# Patient Record
Sex: Female | Born: 1945 | Race: Black or African American | Hispanic: No | Smoking: Never smoker
Health system: Southern US, Community
[De-identification: ages and names within clinical notes are randomized; demographics above are authoritative.]

## PROBLEM LIST (undated history)

## (undated) DIAGNOSIS — R011 Cardiac murmur, unspecified: Secondary | ICD-10-CM

## (undated) DIAGNOSIS — D649 Anemia, unspecified: Secondary | ICD-10-CM

## (undated) DIAGNOSIS — D569 Thalassemia, unspecified: Secondary | ICD-10-CM

## (undated) DIAGNOSIS — Z86010 Personal history of colonic polyps: Secondary | ICD-10-CM

## (undated) HISTORY — PX: ABDOMINAL HYSTERECTOMY: SHX81

## (undated) HISTORY — DX: Cardiac murmur, unspecified: R01.1

## (undated) HISTORY — PX: COLONOSCOPY: SHX174

## (undated) HISTORY — DX: Personal history of colonic polyps: Z86.010

---

## 2002-03-22 ENCOUNTER — Other Ambulatory Visit: Admission: RE | Admit: 2002-03-22 | Discharge: 2002-03-22 | Payer: Self-pay | Admitting: Obstetrics and Gynecology

## 2003-03-25 ENCOUNTER — Other Ambulatory Visit: Admission: RE | Admit: 2003-03-25 | Discharge: 2003-03-25 | Payer: Self-pay | Admitting: Obstetrics and Gynecology

## 2004-01-22 ENCOUNTER — Encounter: Admission: RE | Admit: 2004-01-22 | Discharge: 2004-01-22 | Payer: Self-pay | Admitting: Family Medicine

## 2004-04-14 ENCOUNTER — Other Ambulatory Visit: Admission: RE | Admit: 2004-04-14 | Discharge: 2004-04-14 | Payer: Self-pay | Admitting: Obstetrics and Gynecology

## 2005-03-18 ENCOUNTER — Ambulatory Visit: Payer: Self-pay | Admitting: Family Medicine

## 2005-04-20 ENCOUNTER — Other Ambulatory Visit: Admission: RE | Admit: 2005-04-20 | Discharge: 2005-04-20 | Payer: Self-pay | Admitting: Obstetrics and Gynecology

## 2007-07-12 ENCOUNTER — Ambulatory Visit: Payer: Self-pay | Admitting: Internal Medicine

## 2007-07-26 ENCOUNTER — Ambulatory Visit: Payer: Self-pay | Admitting: Internal Medicine

## 2007-07-26 ENCOUNTER — Encounter: Payer: Self-pay | Admitting: Internal Medicine

## 2008-04-10 ENCOUNTER — Encounter: Payer: Self-pay | Admitting: Family Medicine

## 2008-04-10 LAB — CONVERTED CEMR LAB
Ferritin: 912 ng/mL
HCT: 29.3 %
Hemoglobin: 8.7 g/dL
Iron: 78 ug/dL
Platelets: 244 10*3/uL
WBC: 6.2 10*3/uL

## 2008-04-16 ENCOUNTER — Ambulatory Visit: Payer: Self-pay | Admitting: Hematology and Oncology

## 2008-04-17 ENCOUNTER — Encounter: Payer: Self-pay | Admitting: Family Medicine

## 2008-04-17 LAB — CBC WITH DIFFERENTIAL/PLATELET
Basophils Absolute: 0.1 10*3/uL (ref 0.0–0.1)
HCT: 29.9 % — ABNORMAL LOW (ref 34.8–46.6)
HGB: 9.1 g/dL — ABNORMAL LOW (ref 11.6–15.9)
MONO#: 0.4 10*3/uL (ref 0.1–0.9)
NEUT%: 70.7 % (ref 39.6–76.8)
WBC: 7.2 10*3/uL (ref 3.9–10.0)
lymph#: 1.6 10*3/uL (ref 0.9–3.3)

## 2008-04-17 LAB — COMPREHENSIVE METABOLIC PANEL
ALT: 10 U/L (ref 0–35)
BUN: 15 mg/dL (ref 6–23)
CO2: 31 mEq/L (ref 19–32)
Calcium: 10.3 mg/dL (ref 8.4–10.5)
Chloride: 103 mEq/L (ref 96–112)
Creatinine, Ser: 0.82 mg/dL (ref 0.40–1.20)

## 2008-04-17 LAB — LACTATE DEHYDROGENASE: LDH: 152 U/L (ref 94–250)

## 2008-04-17 LAB — MORPHOLOGY

## 2008-04-17 LAB — RETICULOCYTES: IRF: 0.26 (ref 0.130–0.330)

## 2008-04-21 ENCOUNTER — Ambulatory Visit (HOSPITAL_COMMUNITY): Admission: RE | Admit: 2008-04-21 | Discharge: 2008-04-21 | Payer: Self-pay | Admitting: Hematology and Oncology

## 2008-04-21 LAB — VITAMIN B12: Vitamin B-12: 450 pg/mL (ref 211–911)

## 2008-04-21 LAB — DIRECT ANTIGLOBULIN TEST (NOT AT ARMC)
DAT (Complement): NEGATIVE
DAT IgG: NEGATIVE

## 2008-04-21 LAB — HEMOGLOBINOPATHY EVALUATION: Hgb A2 Quant: 5 % — ABNORMAL HIGH (ref 2.2–3.2)

## 2008-04-21 LAB — IRON AND TIBC: %SAT: 17 % — ABNORMAL LOW (ref 20–55)

## 2008-04-21 IMAGING — US US ABDOMEN COMPLETE
1 series · 13 of 25 positions shown · non-contrast
Comparison: None.

CLINICAL DATA: 61-year-old female with thalassemia.  Remote history
of gallstones.

ABDOMEN ULTRASOUND
TECHNIQUE: Complete abdominal ultrasound examination was performed
including evaluation of the liver, gallbladder, bile ducts,
pancreas, kidneys, spleen, IVC, and abdominal aorta.

[Series 1: unknown · 0.30mm/px · 13 of 65 slices shown]
[im 1/65]
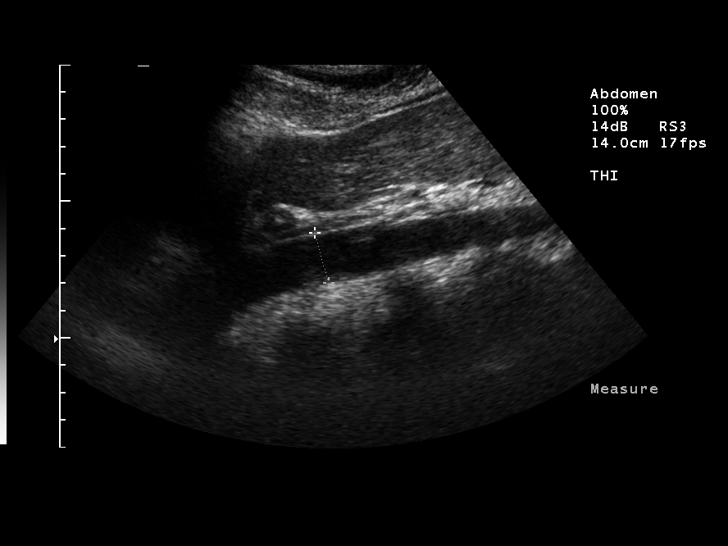
[im 6/65]
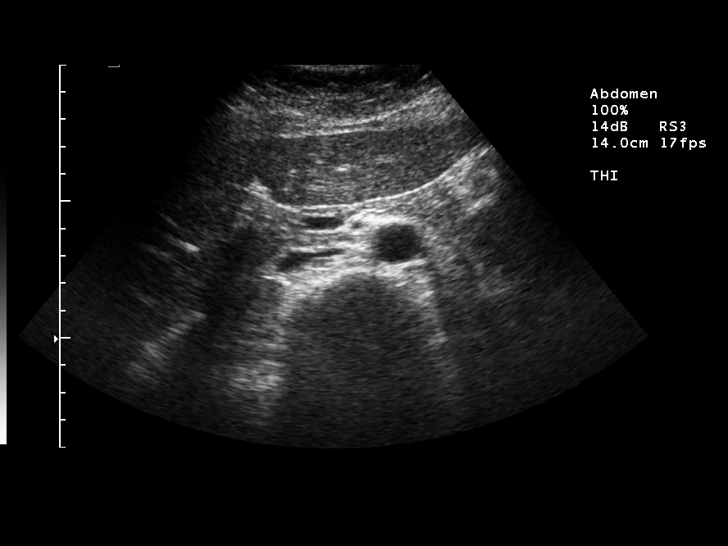
[im 11/65]
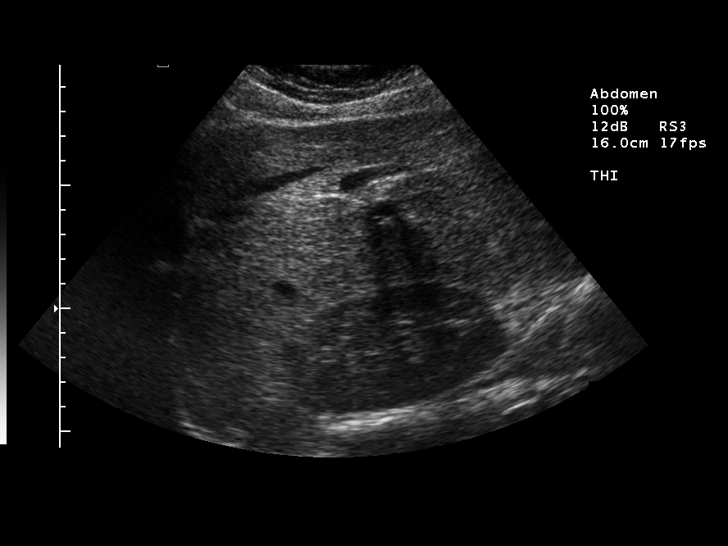
[im 17/65]
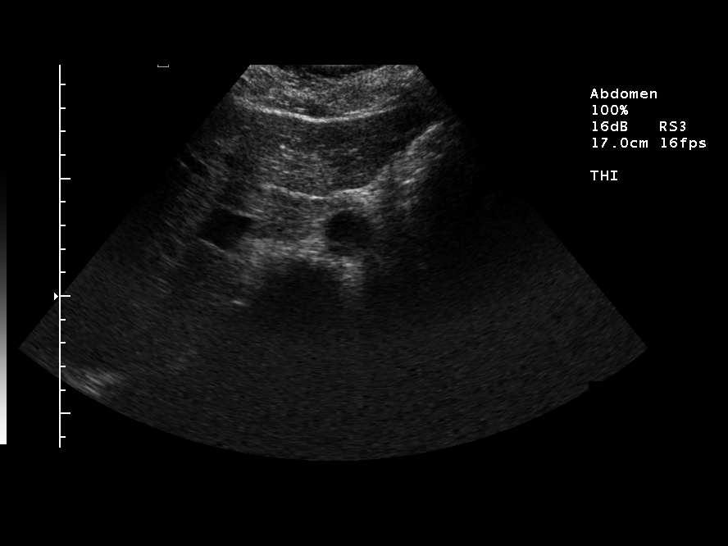
[im 22/65]
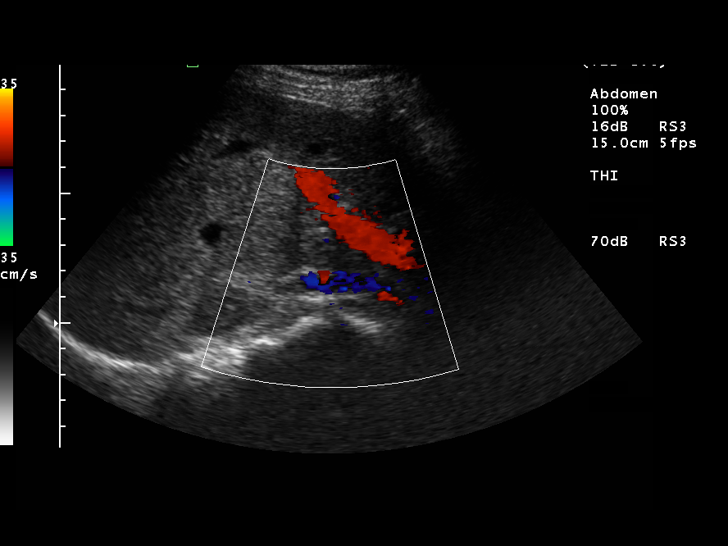
[im 27/65]
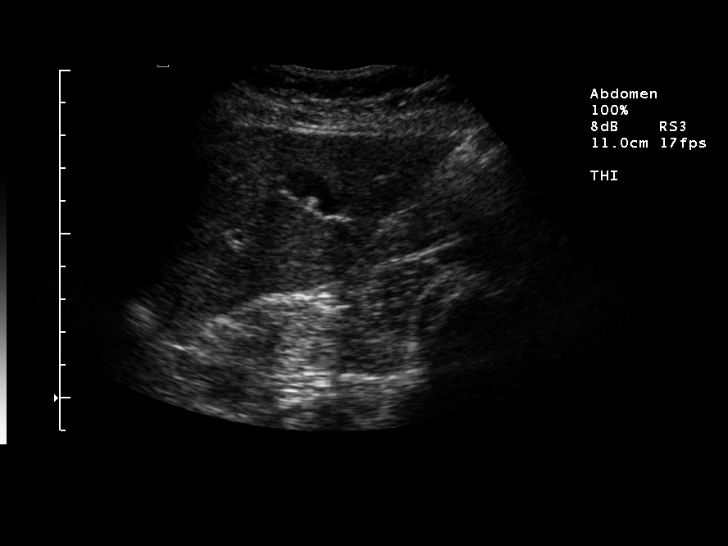
[im 33/65]
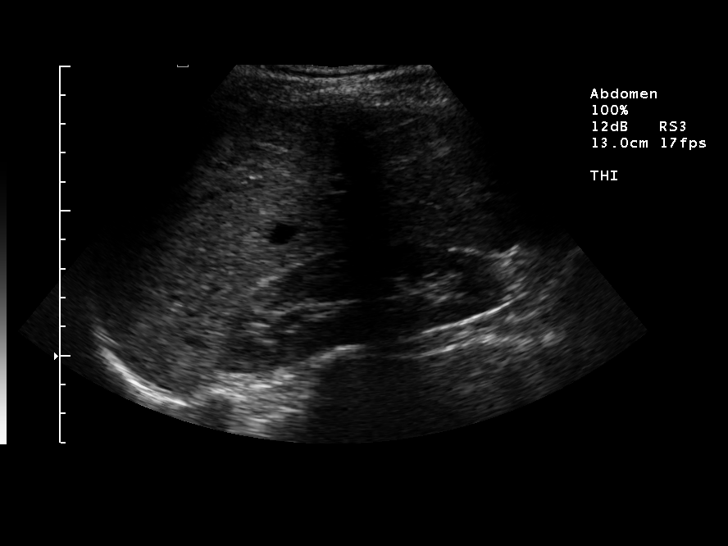
[im 38/65]
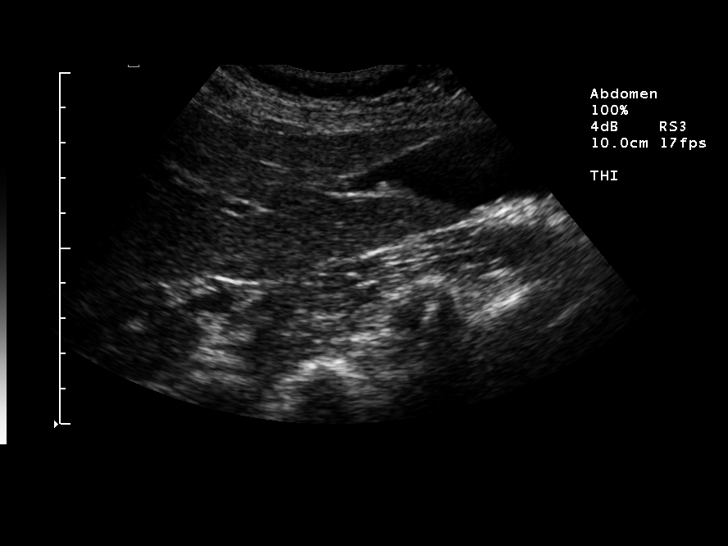
[im 43/65]
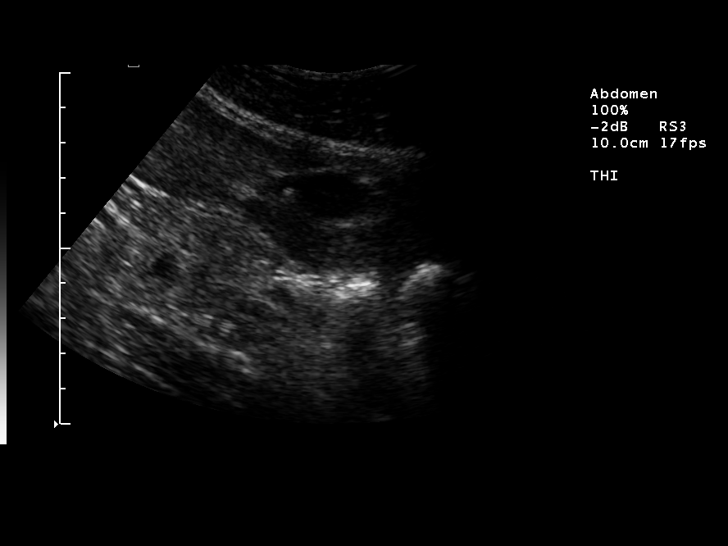
[im 49/65]
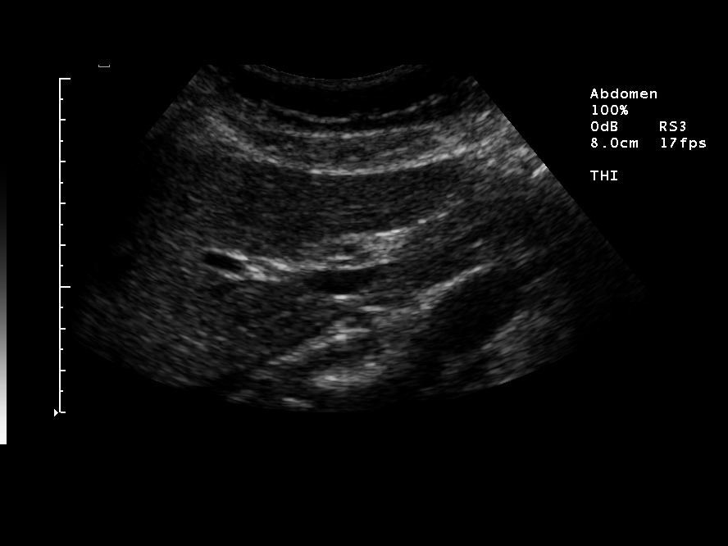
[im 54/65]
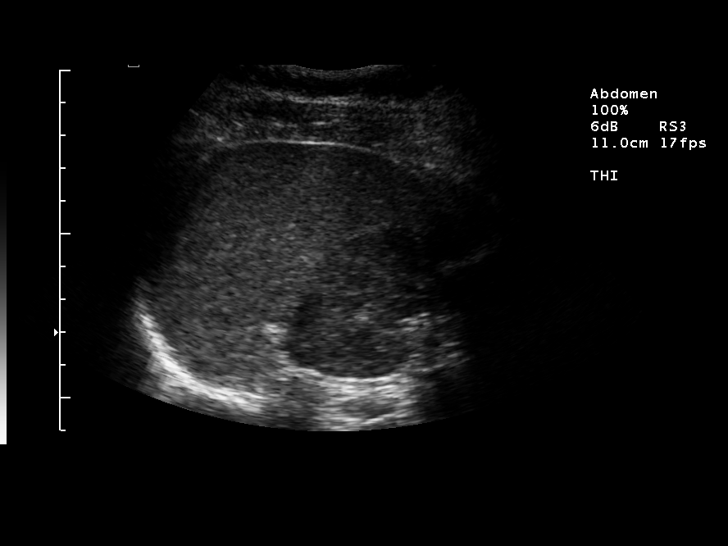
[im 59/65]
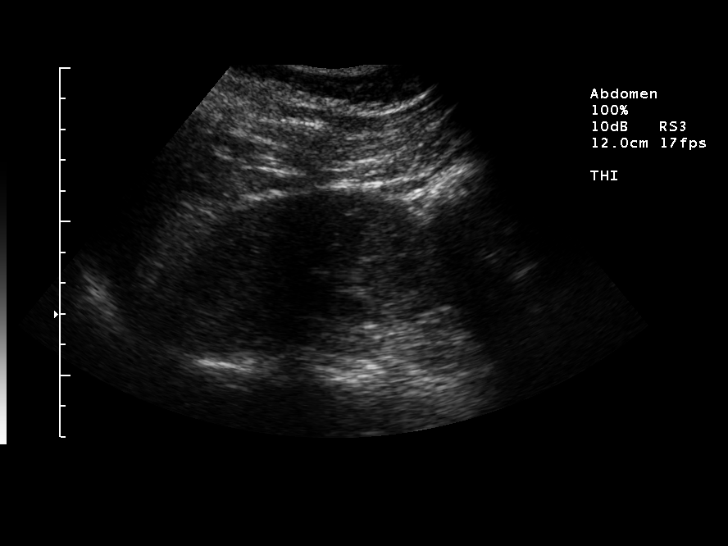
[im 65/65]
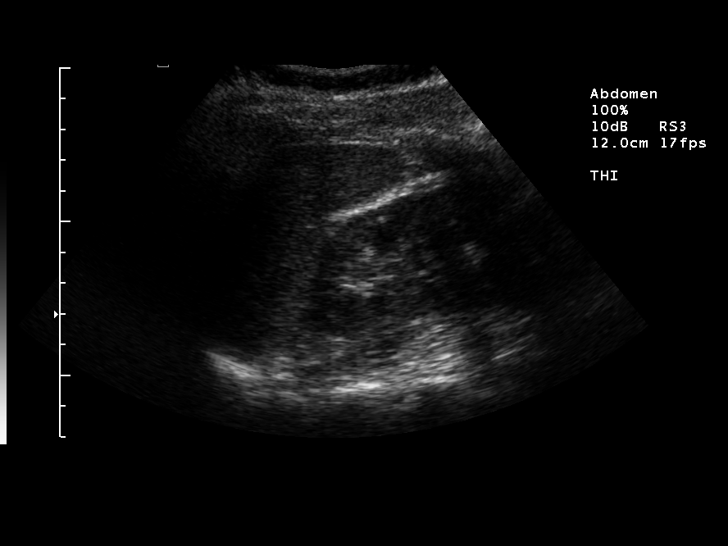

[13 of 25 positions shown; findings below may reference images not displayed]

FINDINGS: The visualized abdominal aorta and inferior vena cava are
normal.  Maximal aortic diameter is 1.9 cm.  The kidneys are not
obstructed with normal echotexture bilaterally measuring 10.4 and
10.7 cm on the right and left respectively.  The pancreas is
normal.    The common bile duct is nondilated at 2.4 mm.

Multiple small echogenic foci are seen within the gallbladder
lumen.  Some of these demonstrate shadowing and are compatible with
small mobile stones.  One lesion which measures approximately 4 mm
does not shadow.  This could represent a small polyp or
nonshadowing stone.  No sonographic Murphy's sign was elicited.
The gallbladder wall thickness is normal at 2.2 mm.

Hepatic echotexture and morphology are normal.  The spleen has
normal echotexture and measures at the upper limits of normal (9.6
cm in length  but with modest increased width).  No free fluid.
IMPRESSION: 1.  Liver and spleen are within normal limits.
2.  Multiple small mobile gallstones and 4 mm nonshadowing stone
versus gallbladder polyp.  Follow-up imaging is generally not
indicated for polyps less than 5-10 mm.

## 2008-04-30 ENCOUNTER — Encounter: Payer: Self-pay | Admitting: Family Medicine

## 2008-11-27 ENCOUNTER — Ambulatory Visit: Payer: Self-pay | Admitting: Family Medicine

## 2008-11-27 LAB — CONVERTED CEMR LAB
ALT: 13 units/L (ref 0–35)
AST: 18 units/L (ref 0–37)
Albumin: 4.7 g/dL (ref 3.5–5.2)
Alkaline Phosphatase: 69 units/L (ref 39–117)
BUN: 17 mg/dL (ref 6–23)
CO2: 25 meq/L (ref 19–32)
Calcium: 9.7 mg/dL (ref 8.4–10.5)
Chloride: 103 meq/L (ref 96–112)
Creatinine, Ser: 0.86 mg/dL (ref 0.40–1.20)
Glucose, Bld: 90 mg/dL (ref 70–99)
HCT: 30.3 % — ABNORMAL LOW (ref 36.0–46.0)
Hemoglobin: 9.1 g/dL — ABNORMAL LOW (ref 12.0–15.0)
MCHC: 30 g/dL (ref 30.0–36.0)
MCV: 58 fL — ABNORMAL LOW (ref 78.0–100.0)
Platelets: 290 10*3/uL (ref 150–400)
Potassium: 4.5 meq/L (ref 3.5–5.3)
RBC: 5.22 M/uL — ABNORMAL HIGH (ref 3.87–5.11)
RDW: 18.3 % — ABNORMAL HIGH (ref 11.5–15.5)
Sodium: 145 meq/L (ref 135–145)
Total Bilirubin: 1.4 mg/dL — ABNORMAL HIGH (ref 0.3–1.2)
Total Protein: 6.9 g/dL (ref 6.0–8.3)
WBC: 6.7 10*3/uL (ref 4.0–10.5)

## 2008-11-28 ENCOUNTER — Encounter: Payer: Self-pay | Admitting: Family Medicine

## 2009-06-01 ENCOUNTER — Ambulatory Visit: Payer: Self-pay | Admitting: Family Medicine

## 2009-06-01 LAB — CONVERTED CEMR LAB
HDL: 71 mg/dL (ref >39)
LDL Cholesterol: 98 mg/dL (ref 0–99)
Total CHOL/HDL Ratio: 2.7
Triglycerides: 106 mg/dL (ref <150)

## 2009-06-19 ENCOUNTER — Telehealth: Payer: Self-pay | Admitting: Family Medicine

## 2009-06-19 ENCOUNTER — Encounter: Payer: Self-pay | Admitting: Family Medicine

## 2010-05-05 ENCOUNTER — Ambulatory Visit: Payer: Self-pay | Admitting: Family Medicine

## 2010-05-05 DIAGNOSIS — R011 Cardiac murmur, unspecified: Secondary | ICD-10-CM | POA: Insufficient documentation

## 2010-05-05 LAB — CONVERTED CEMR LAB
Ferritin: 903 ng/mL — ABNORMAL HIGH (ref 10–291)
Hemoglobin: 9 g/dL — ABNORMAL LOW (ref 12.0–15.0)
LDL Cholesterol: 116 mg/dL — ABNORMAL HIGH (ref 0–99)
MCHC: 30.3 g/dL (ref 30.0–36.0)
RDW: 18.6 % — ABNORMAL HIGH (ref 11.5–15.5)
Triglycerides: 85 mg/dL (ref <150)

## 2010-05-06 ENCOUNTER — Encounter: Payer: Self-pay | Admitting: Family Medicine

## 2010-12-06 ENCOUNTER — Encounter (INDEPENDENT_AMBULATORY_CARE_PROVIDER_SITE_OTHER): Payer: Self-pay | Admitting: *Deleted

## 2010-12-14 NOTE — Letter (Signed)
Summary: Generic Letter  [facility]  [address]   [place], [place] [address]   Phone: [phone]  Fax: [phone]    05/06/2010  Hannah Landry [address] [place], [place]  [address]  Dear Ms. Zwart,  All your labs look stable. Keep up the good work!  Tests: (1) CBC NO Diff (Complete Blood Count) (10000)   Order Note: FASTING   WBC                       7.0 K/uL                    4.0-10.5   RBC                  [H]  5.13 MIL/uL                 3.87-5.11   Hemoglobin           [L]  9.0 g/dL                    33.2-95.1   Hematocrit           [L]  29.7 %                      36.0-46.0   MCV                  [L]  57.9 fL                     78.0-100.0 ! MCH                  [L]  17.5 pg                     26.0-34.0   MCHC                      30.3 g/dL                   88.4-16.6   RDW                  [H]  18.6 %                      11.5-15.5   Platelet Count            253 K/uL                    150-400  Tests: (2) Lipid Profile (06301)   Cholesterol               200 mg/dL                   6-010     ATP III Classification:           < 200        mg/dL        Desirable          200 - 239     mg/dL        Borderline High          >= 240        mg/dL        High         Triglyceride  85 mg/dL                    <010   HDL Cholesterol           67 mg/dL                    >27   Total Chol/HDL Ratio      3.0 Ratio  VLDL Cholesterol (Calc)                             17 mg/dL                    2-53  LDL Cholesterol (Calc)                             116 mg/dL                   6-64                Tests: (3) Ferritin (40347)   Ferritin             [H]  903 ng/mL                   10-291   Sincerely,   Pearlean Brownie MD  Appended Document: Generic Letter MAILED.

## 2010-12-14 NOTE — Assessment & Plan Note (Signed)
Summary: cpe,tcb   Vital Signs:  Patient profile:   65 year old female Height:      61 inches Weight:      128 pounds BMI:     24.27 BSA:     1.56 Temp:     98.2 degrees F Pulse rate:   89 / minute BP sitting:   126 / 65  Vitals Entered By: Jone Baseman CMA (May 05, 2010 8:54 AM) CC: CPE Is Patient Diabetic? No Pain Assessment Patient in pain? no        CC:  CPE.  History of Present Illness: Feels very well  No more dizzyness  Sees Dr Marcelle Overlie for yearly gyn exams   Habits & Providers  Alcohol-Tobacco-Diet     Tobacco Status: never  -  Date:  01/12/2010    Mammogram By Dr Marcelle Overlie nl by report  Date:  01/12/2009    Bone Density By Dr Marcelle Overlie nl by report  Current Medications (verified): 1)  Folic Acid 400 Mcg Tabs (Folic Acid) .Marland Kitchen.. 1 Daily 2)  Calcium Carbonate-Vitamin D 600-400 Mg-Unit  Tabs (Calcium Carbonate-Vitamin D) .... 2 Daily 3)  Qc Womens Daily Multivitamin  Tabs (Multiple Vitamins-Minerals) .Marland Kitchen.. 1 Daily  Allergies: No Known Drug Allergies  Review of Systems  The patient denies anorexia, fever, weight loss, weight gain, vision loss, decreased hearing, hoarseness, chest pain, syncope, dyspnea on exertion, peripheral edema, prolonged cough, headaches, hemoptysis, abdominal pain, melena, hematochezia, severe indigestion/heartburn, hematuria, incontinence, genital sores, muscle weakness, suspicious skin lesions, transient blindness, difficulty walking, depression, unusual weight change, abnormal bleeding, enlarged lymph nodes, and angioedema.    Physical Exam  General:  Well-developed,well-nourished,in no acute distress; alert,appropriate and cooperative throughout examination Head:  Normocephalic and atraumatic without obvious abnormalities. No apparent alopecia or balding. Ears:  External ear exam shows no significant lesions or deformities.  Otoscopic examination reveals clear canals, tympanic membranes are intact bilaterally without bulging,  retraction, inflammation or discharge. Hearing is grossly normal bilaterally. Nose:  External nasal examination shows no deformity or inflammation. Nasal mucosa are pink and moist without lesions or exudates. Mouth:  Oral mucosa and oropharynx without lesions or exudates.  Teeth in good repair. Neck:  No deformities, masses, or tenderness noted. Lungs:  Normal respiratory effort, chest expands symmetrically. Lungs are clear to auscultation, no crackles or wheezes. Heart:  Normal rate and regular rhythm. 1-2/6 systolic murmur with prominent 2nd heart sound Abdomen:  Bowel sounds positive,abdomen soft and non-tender without masses, organomegaly or hernias noted. Msk:  No deformity or scoliosis noted of thoracic or lumbar spine.   Extremities:  No clubbing, cyanosis, edema, or deformity noted with normal full range of motion of all joints.   Skin:  Intact without suspicious lesions or rashes.  Large sebk on back.  Scattered hyperpigmented lesons on skin without suspicious characteristics  Cervical Nodes:  No lymphadenopathy noted   Impression & Recommendations:  Problem # 1:  Preventive Health Care (ICD-V70.0) normal exam healthy habits.  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  Had echo in [place] in past that was reassuring.    Complete Medication List: 1)  Folic Acid 400 Mcg Tabs (Folic acid) .Marland Kitchen.. 1 daily 2)  Calcium Carbonate-vitamin D 600-400 Mg-unit Tabs (Calcium carbonate-vitamin d) .... 2 daily  3)  Qc Womens Daily Multivitamin Tabs (Multiple vitamins-minerals) .Marland Kitchen.. 1 daily  Other Orders: Lipid-[facility] ([address]) [facility] - Est  40-64 yrs ([address])  Patient Instructions: 1)  Please schedule a follow-up appointment in 1 year.  2)  I will call you  if your lab is abnormal otherwise I will send you a letter within 2 weeks. 3)  It is important that you exercise reguarly at least 30 minutes 5 times a week. If you develop chest pain, have severe difficulty breathing, or feel very tired, stop exercising immediately and seek medical attention.   Prevention & Chronic Care Immunizations   Influenza vaccine: Not documented    Tetanus booster: Not documented    Pneumococcal vaccine: Not documented    H. zoster vaccine: 11/27/2008: Zostavax  Colorectal Screening   Hemoccult: Not documented   Hemoccult due: Not Indicated    Colonoscopy: [facility]  (11/14/2006)   Colonoscopy due: 11/14/2016  Other Screening   Pap smear: Hysterectormy - Sees Dr Marcelle Overlie  (10/14/2008)   Pap smear due: Not Indicated    Mammogram: By Dr Marcelle Overlie nl by report  (01/12/2010)   Mammogram due: 01/12/2009    DXA bone density scan: By Dr Marcelle Overlie nl by report  (01/12/2009)   Smoking status: never  (05/05/2010)  Lipids   Total Cholesterol: 190  (06/01/2009)   LDL: 98  (06/01/2009)   LDL Direct: Not documented   HDL: 71  (06/01/2009)   Triglycerides: 106  (06/01/2009)

## 2010-12-16 NOTE — Letter (Signed)
Summary: Generic Letter  [facility]  [address]   [place], [place] [address]   Phone: [phone]  Fax: [phone]     12/06/2010  [address] [place], [place]  [address]  Dear Ms. Leandro,  We are happy to let you know that since you are covered under Medicare you are able to have a FREE Welcome to Medicare visit at the [facility] to discuss your HEALTH. There will be no co-payment.  At this visit you will meet with your doctor and Arlys John an expert in wellness and the health coach at our clinic.  At this visit we will discuss ways to keep you healthy and feeling well.  This visit will not replace your regular doctor visit and we cannot refill medications.     You will need to plan to be here at least one hour to talk about your medical history, your current status, review all of your medications, and discuss your future plans for your health.  This information will be entered into your record for your doctor to have and review.  If you are interested in staying healthy, this type of visit can help.  Please call the office at: [phone], to schedule a "Medicare Wellness Visit".  The day of the visit you should bring in all of your medications, including any vitamins, herbs, over the counter products you take.  Make a list of all the other doctors that you see, so we know who they are. If you have any other health documents please bring them.  We look forward to helping you stay healthy.  Sincerely,   [facility]  IPPE

## 2011-03-09 ENCOUNTER — Ambulatory Visit: Payer: Self-pay | Admitting: Family Medicine

## 2011-05-09 ENCOUNTER — Encounter: Payer: Self-pay | Admitting: Family Medicine

## 2011-05-09 ENCOUNTER — Ambulatory Visit (INDEPENDENT_AMBULATORY_CARE_PROVIDER_SITE_OTHER): Payer: Medicare Other | Admitting: Family Medicine

## 2011-05-09 DIAGNOSIS — D563 Thalassemia minor: Secondary | ICD-10-CM

## 2011-05-09 DIAGNOSIS — R011 Cardiac murmur, unspecified: Secondary | ICD-10-CM

## 2011-05-09 DIAGNOSIS — Z23 Encounter for immunization: Secondary | ICD-10-CM

## 2011-05-09 DIAGNOSIS — Z13220 Encounter for screening for lipoid disorders: Secondary | ICD-10-CM | POA: Insufficient documentation

## 2011-05-09 DIAGNOSIS — Z0000 Encounter for general adult medical examination without abnormal findings: Secondary | ICD-10-CM

## 2011-05-09 NOTE — Assessment & Plan Note (Signed)
Current on prevention items and normal exam.  For mild DJD suggested tylenol. Call us if worsening

## 2011-05-09 NOTE — Patient Instructions (Signed)
I will call you if your tests are not good.  Otherwise I will send you a letter.  If you do not hear from me with in 2 weeks please call our office.     Keep doing what you are doing.  Perhaps a little more exercise  If the joints are bothering you more then call or come back.  Especially if the finger is sticking call us.\

## 2011-05-09 NOTE — Assessment & Plan Note (Signed)
Not heard today

## 2011-05-09 NOTE — Assessment & Plan Note (Signed)
No symptoms Will check blood tests

## 2011-05-09 NOTE — Progress Notes (Signed)
Addended by: Garen Grams F on: 05/09/2011 12:00 PM   Modules accepted: Orders

## 2011-05-09 NOTE — Progress Notes (Signed)
  Subjective:    Patient ID: Hannah Landry, female    DOB: 1946-02-13, 65 y.o.   MRN: [id number]  HPI  Feels well except for  Pain swelling and intermittent catching of R long finger.  Is better now but was catching in the past.  Mild pain but does not keep her from any activity.  Not taking any medications.  Mild pain intermittently in L shoulder but otherwise no other joint pains or swelling or rashes or fevers  Patient reports no  vision/ hearing changes,anorexia, weight change, fever ,adenopathy, persistant / recurrent hoarseness, swallowing issues, chest pain, edema,persistant / recurrent cough, hemoptysis, dyspnea(rest, exertional, paroxysmal nocturnal), gastrointestinal  bleeding (melena, rectal bleeding), abdominal pain, excessive heart burn, GU symptoms(dysuria, hematuria, pyuria, voiding/incontinence  Issues) syncope, focal weakness, severe memory loss, concerning skin lesions, depression, anxiety, abnormal bruising/bleeding, major joint swelling.  Review of Systems     Objective:   Physical Exam    Ears:  External ear exam shows no significant lesions or deformities.  Otoscopic examination reveals clear canals, tympanic membranes are intact bilaterally without bulging, retraction, inflammation or discharge. Hearing is grossly normal bilaterall Neck:  No deformities, thyromegaly, masses, or tenderness noted.   Supple with full range of motion without pain. Lungs:  Normal respiratory effort, chest expands symmetrically. Lungs are clear to auscultation, no crackles or wheezes. Heart - Regular rate and rhythm.  No murmurs, gallops or rubs.    Abdomen: soft and non-tender without masses, organomegaly or hernias noted.  No guarding or rebound Extremities:  No cyanosis, edema, or deformity noted with good range of motion of all major joints.   Skin:  Intact without suspicious lesions or rashes large seb k on back  R Hand - long finger is slightly diffusely swollen maximally at PIP.  No  redness or pain.  No catching with ROM L Shoulder - full ROM without pain     Assessment & Plan:

## 2011-05-10 ENCOUNTER — Encounter: Payer: Self-pay | Admitting: Family Medicine

## 2011-05-10 LAB — CBC
MCV: 57.1 fL — ABNORMAL LOW (ref 78.0–100.0)
Platelets: 290 10*3/uL (ref 150–400)
RBC: 5.39 MIL/uL — ABNORMAL HIGH (ref 3.87–5.11)
WBC: 6.8 10*3/uL (ref 4.0–10.5)

## 2011-07-04 ENCOUNTER — Telehealth: Payer: Self-pay | Admitting: Family Medicine

## 2011-07-04 NOTE — Telephone Encounter (Signed)
Hannah Landry received a denial for her visit on 05/09/11 due to incorrect procedure and dx coding.  She only came in for general office visit and not a complete phy exam.  She want the visit to be recoded and refiled to medicare.  Please check with Babs up front to coordinate correction.

## 2011-07-05 NOTE — Telephone Encounter (Signed)
Go in top patient's chart and create an addendum for that visit.  Remove the phsycial dx code V70.0 and enter level 3 as procedure.  Once you have done this let me know and I will contact [employer] department to have them refile the visit to medicare.

## 2011-07-05 NOTE — Progress Notes (Signed)
Addended by: Deirdre Priest, LEE L on: 07/05/2011 01:48 PM   Modules accepted: Level of Service

## 2011-07-05 NOTE — Telephone Encounter (Signed)
She should be a level 3 - Babs what do I need to do?  Thanks  LC

## 2011-07-05 NOTE — Telephone Encounter (Signed)
Done. thanks

## 2012-01-24 DIAGNOSIS — H40029 Open angle with borderline findings, high risk, unspecified eye: Secondary | ICD-10-CM | POA: Diagnosis not present

## 2012-02-01 DIAGNOSIS — Z1231 Encounter for screening mammogram for malignant neoplasm of breast: Secondary | ICD-10-CM | POA: Diagnosis not present

## 2012-02-03 ENCOUNTER — Encounter: Payer: Self-pay | Admitting: Family Medicine

## 2012-05-09 ENCOUNTER — Ambulatory Visit (INDEPENDENT_AMBULATORY_CARE_PROVIDER_SITE_OTHER): Payer: Medicare Other | Admitting: Family Medicine

## 2012-05-09 ENCOUNTER — Encounter: Payer: Self-pay | Admitting: Family Medicine

## 2012-05-09 DIAGNOSIS — E785 Hyperlipidemia, unspecified: Secondary | ICD-10-CM | POA: Insufficient documentation

## 2012-05-09 DIAGNOSIS — Z13220 Encounter for screening for lipoid disorders: Secondary | ICD-10-CM

## 2012-05-09 DIAGNOSIS — D563 Thalassemia minor: Secondary | ICD-10-CM

## 2012-05-09 LAB — LIPID PANEL
HDL: 74 mg/dL (ref >39)
LDL Cholesterol: 109 mg/dL — ABNORMAL HIGH (ref 0–99)
Total CHOL/HDL Ratio: 2.7 Ratio
VLDL: 19 mg/dL (ref 0–40)

## 2012-05-09 LAB — CBC
HCT: 28.4 % — ABNORMAL LOW (ref 36.0–46.0)
Hemoglobin: 9 g/dL — ABNORMAL LOW (ref 12.0–15.0)
MCH: 17.3 pg — ABNORMAL LOW (ref 26.0–34.0)
MCV: 54.7 fL — ABNORMAL LOW (ref 78.0–100.0)
Platelets: 275 10*3/uL (ref 150–400)
RBC: 5.19 MIL/uL — ABNORMAL HIGH (ref 3.87–5.11)

## 2012-05-09 NOTE — Assessment & Plan Note (Signed)
Hannah Landry Will check FLP

## 2012-05-09 NOTE — Progress Notes (Signed)
  Subjective:    Patient ID: Hannah Landry, female    DOB: Jun 10, 1946, 66 y.o.   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  No symptoms  Hyperlipidemia last cholesterol was 238 in 8/12 at her gynecologist  Patient reports no  vision/ hearing changes,anorexia, weight change, fever ,adenopathy, persistant / recurrent hoarseness, swallowing issues, chest pain, edema,persistant / recurrent cough, hemoptysis, dyspnea(rest, exertional, paroxysmal nocturnal), gastrointestinal  bleeding (melena, rectal bleeding), abdominal pain, excessive heart burn, GU symptoms(dysuria, hematuria, pyuria, voiding/incontinence  Issues) syncope, focal weakness, severe memory loss, concerning skin lesions, depression, anxiety, abnormal bruising/bleeding, major joint swelling, breast masses or abnormal vaginal bleeding.    Exercises - walks up and down steps in 3 story house.  Uses treadmill occasionally  Review of Symptoms - see HPI  PMH - Smoking status noted.      Review of Systems     Objective:   Physical Exam Neck:  No deformities, thyromegaly, masses, or tenderness noted.   Supple with full range of motion without pain. Heart - Regular rate and rhythm.  No murmurs, gallops or rubs.    Lungs:  Normal respiratory effort, chest expands symmetrically. Lungs are clear to auscultation, no crackles or wheezes. Abdomen: soft and non-tender without masses, organomegaly or hernias noted.  No guarding or rebound Extremities:  No cyanosis, edema, or deformity noted with good range of motion of all major joints.   Mouth - no lesions, mucous membranes are moist, no decaying teeth         Assessment & Plan:   Normal Exam and no concerning history

## 2012-05-09 NOTE — Patient Instructions (Addendum)
Cut back to one calcium per day  I will call you if your tests are not good.  Otherwise I will send you a letter.  If you do not hear from me with in 2 weeks please call our office.     Have a good summer

## 2012-05-09 NOTE — Assessment & Plan Note (Signed)
Feels well will check labs

## 2012-05-10 ENCOUNTER — Encounter: Payer: Self-pay | Admitting: Family Medicine

## 2012-06-09 ENCOUNTER — Encounter (HOSPITAL_COMMUNITY): Payer: Self-pay | Admitting: *Deleted

## 2012-06-09 ENCOUNTER — Emergency Department (INDEPENDENT_AMBULATORY_CARE_PROVIDER_SITE_OTHER)
Admission: EM | Admit: 2012-06-09 | Discharge: 2012-06-09 | Disposition: A | Discharge status: Home / Self Care | Payer: Medicare Other | Source: Home / Self Care | Attending: Emergency Medicine | Admitting: Emergency Medicine

## 2012-06-09 DIAGNOSIS — R21 Rash and other nonspecific skin eruption: Secondary | ICD-10-CM

## 2012-06-09 DIAGNOSIS — R0982 Postnasal drip: Secondary | ICD-10-CM

## 2012-06-09 HISTORY — DX: Thalassemia, unspecified: D56.9

## 2012-06-09 HISTORY — DX: Anemia, unspecified: D64.9

## 2012-06-09 MED ORDER — TRIAMCINOLONE ACETONIDE 0.1 % EX CREA: TOPICAL_CREAM | Freq: Two times a day (BID) | CUTANEOUS | Status: AC

## 2012-06-09 MED ORDER — TRIAMCINOLONE ACETONIDE 0.1 % EX CREA: TOPICAL_CREAM | Freq: Two times a day (BID) | CUTANEOUS | Status: DC

## 2012-06-09 MED ORDER — LORATADINE-PSEUDOEPHEDRINE ER 5-120 MG PO TB12: 1.0000 | ORAL_TABLET | Freq: Two times a day (BID) | ORAL | Status: AC

## 2012-06-09 NOTE — ED Provider Notes (Signed)
History     CSN: [id number]  Arrival date & time 06/09/12  1131   First MD Initiated Contact with Patient 06/09/12 1143      Chief Complaint  Patient presents with  . Insect Bite    (Consider location/radiation/quality/duration/timing/severity/associated sxs/prior treatment) HPI Comments: Patient presents to urgent care this morning complaining of an insect bite to her left lower leg which is red and somewhat dry in the center for about 3-4 days. She also relates having postnasal dripping for more than a week with a mild headache and minimal sinus congestion but is not responsive to Claritin which she has been taking for several days now. She describes that she has had the sinus congestion and dripping in the past has responded well to Claritin but is not doing well this time.  Patient denies any further symptoms such as arthralgias, myalgias, changes in appetite, fevers, abdominal pains nausea or vomiting. The rash on her right lower leg is somewhat itchy but is not tender at touch or with movement. She denies having seen any ticks on her skin at all, and describes that she has several mosquito bites on her upper arms but those are getting better now as they are fading away.  The history is provided by the patient.    Past Medical History  Diagnosis Date  . Anemia   . Thalassanemia     Past Surgical History  Procedure Date  . Abdominal hysterectomy   . Cesarean section     History reviewed. No pertinent family history.  History  Substance Use Topics  . Smoking status: Never Smoker   . Smokeless tobacco: Not on file  . Alcohol Use: Not on file    OB History    Grav Para Term Preterm Abortions TAB SAB Ect Mult Living                  Review of Systems  Constitutional: Negative for fever, chills, diaphoresis, activity change, appetite change and fatigue.  Cardiovascular: Negative for chest pain, palpitations and leg swelling.  Gastrointestinal: Negative for abdominal  pain and abdominal distention.  Skin: Positive for color change and rash. Negative for wound.  Neurological: Negative for dizziness, weakness and numbness.    Allergies  Review of patient's allergies indicates no known allergies.  Home Medications   Current Outpatient Rx  Name Route Sig Dispense Refill  . LORATADINE 10 MG PO TABS Oral Take 10 mg by mouth daily.    Marland Kitchen CALCIUM CARBONATE-VITAMIN D 600-400 MG-UNIT PO TABS Oral Take 1 tablet by mouth daily.     Marland Kitchen FOLIC ACID 400 MCG PO TABS Oral Take 400 mcg by mouth daily.      Marland Kitchen LORATADINE-PSEUDOEPHEDRINE ER 5-120 MG PO TB12 Oral Take 1 tablet by mouth 2 (two) times daily. 14 tablet 0  . QC WOMENS DAILY MULTIVITAMIN PO TABS Oral Take 1 tablet by mouth daily.      . TRIAMCINOLONE ACETONIDE 0.1 % EX CREA Topical Apply topically 2 (two) times daily. 30 g 0    BP 162/74  Pulse 67  Temp 98.6 F (37 C) (Oral)  Resp 17  SpO2 99%  Physical Exam  Nursing note and vitals reviewed. Constitutional: She is oriented to person, place, and time. She appears well-developed and well-nourished.  HENT:  Right Ear: Tympanic membrane normal.  Left Ear: Tympanic membrane normal.  Mouth/Throat: Uvula is midline, oropharynx is clear and moist and mucous membranes are normal.  Cardiovascular: Normal rate.   Musculoskeletal:  Normal range of motion.  Neurological: She is alert and oriented to person, place, and time.  Skin: Rash noted. There is erythema.       ED Course  Procedures (including critical care time)  Labs Reviewed - No data to display No results found.   1. Postnasal drip   2. Rash, skin       MDM   Patient presents to urgent care with 2 concerns. One a skin rash on her inner aspect of her right lower leg. And a ongoing postnasal dripping with sinus headache. Patient is afebrile, systemically well comfortable. Rash was most consistent with a localized allergenic type reaction. Versus cellulitis. Patient also had some concerns  about tickborne illnesses although she has not seen any ticks on are attached to her skin. Patient was recommended to use Claritin-D instead of Claritin plain other measures for postnasal dripping were discussed, we discuss in detail symptoms that will warrant further evaluation for tickborne illnesses she acknowledges and agrees to do so if any of those symptoms were to appear. We also discussed that this rash could have been a allergenic type reaction to an insect bite including mosquitoes and we discuss a course of Prevacid along locally in an application of a topical antibiotic at night. To return if area becomes larger tender or warm or. She agrees and understand with followup care as necessary with either [place] or her primary care Dr.       Jimmie Molly, MD 06/09/12 1250

## 2012-06-09 NOTE — ED Notes (Signed)
Pt with ? Insect bite left lower leg red with dry center approx size 2cm round - pt also with c/o headache associated with sinus congestion

## 2012-07-18 DIAGNOSIS — Z01419 Encounter for gynecological examination (general) (routine) without abnormal findings: Secondary | ICD-10-CM | POA: Diagnosis not present

## 2012-07-26 NOTE — Addendum Note (Signed)
Addended by: Pearlean Brownie L on: 07/26/2012 04:51 PM   Modules accepted: Level of Service

## 2012-08-27 ENCOUNTER — Telehealth: Payer: Self-pay | Admitting: Family Medicine

## 2012-08-27 NOTE — Telephone Encounter (Signed)
Are you okay referring her or do you want to see her first? She is Medicare so she will need a referral.  Hannah Landry

## 2012-08-27 NOTE — Telephone Encounter (Signed)
Is asking to speak to nurse about a callus on her foot - wants to know if we can recommend a podiatrist or does she need to come in here 1st

## 2012-08-28 NOTE — Telephone Encounter (Signed)
Please call   If she feels she might need surgery then I would be happy to refer her  If not would be happy to see her here  Thanks  LC

## 2012-08-28 NOTE — Telephone Encounter (Signed)
Spoke with Ms. Geis.  She is unsure if she will need surgery or not for her callous on her foot.  Appointment scheduled with Dr. Deirdre Priest on 08/29/2012 @ 8:30am for him to look at her foot and decide if she needs to be referred out.  Hannah Landry

## 2012-08-29 ENCOUNTER — Ambulatory Visit (INDEPENDENT_AMBULATORY_CARE_PROVIDER_SITE_OTHER): Payer: Medicare Other | Admitting: Family Medicine

## 2012-08-29 ENCOUNTER — Encounter: Payer: Self-pay | Admitting: Family Medicine

## 2012-08-29 VITALS — BP 134/68 | HR 81 | Temp 98.5°F | Ht 61.0 in | Wt 132.0 lb

## 2012-08-29 DIAGNOSIS — L84 Corns and callosities: Secondary | ICD-10-CM | POA: Diagnosis not present

## 2012-08-29 NOTE — Patient Instructions (Addendum)
Keep the area moist with vaseline  Use the Dr Doug Sou doughnut pad to keep pressure  Lightly sand with an emory board twice daily  If the central corn area is not getting better after 3-4 weeks then call   If any signs of infection then call

## 2012-08-29 NOTE — Progress Notes (Signed)
  Subjective:    Patient ID: Armani Gawlik, female    DOB: [birth year], 66 y.o.   MRN: [id number]  HPI  Callus On bottom of left foot for a number of weeks.  Not sure when started.  Using salcylic acid pads.  No known injury or specific overuse.  Mild pain no discharge or redness  Review of Systems     Objective:   Physical Exam   Sole of Left foot 1.5 cm callus with central 0.4 cm core area. Shaved callus leaving central area No bleeding or foreign body found or any signs of wart (capillaries)     Assessment & Plan:  Callus Likely due to friction See patient instructions

## 2012-10-03 DIAGNOSIS — H40029 Open angle with borderline findings, high risk, unspecified eye: Secondary | ICD-10-CM | POA: Diagnosis not present

## 2012-10-03 DIAGNOSIS — H2510 Age-related nuclear cataract, unspecified eye: Secondary | ICD-10-CM | POA: Diagnosis not present

## 2012-11-13 IMAGING — CR DG KNEE STANDING AP BILAT
1 series · 1 of 1 positions shown · non-contrast
Comparison: None.

CLINICAL DATA: Bilateral knee pain radiating down legs towards the
ankles.

EXAM:
BILATERAL KNEES STANDING - 1 VIEW

[w knees ap bilat]
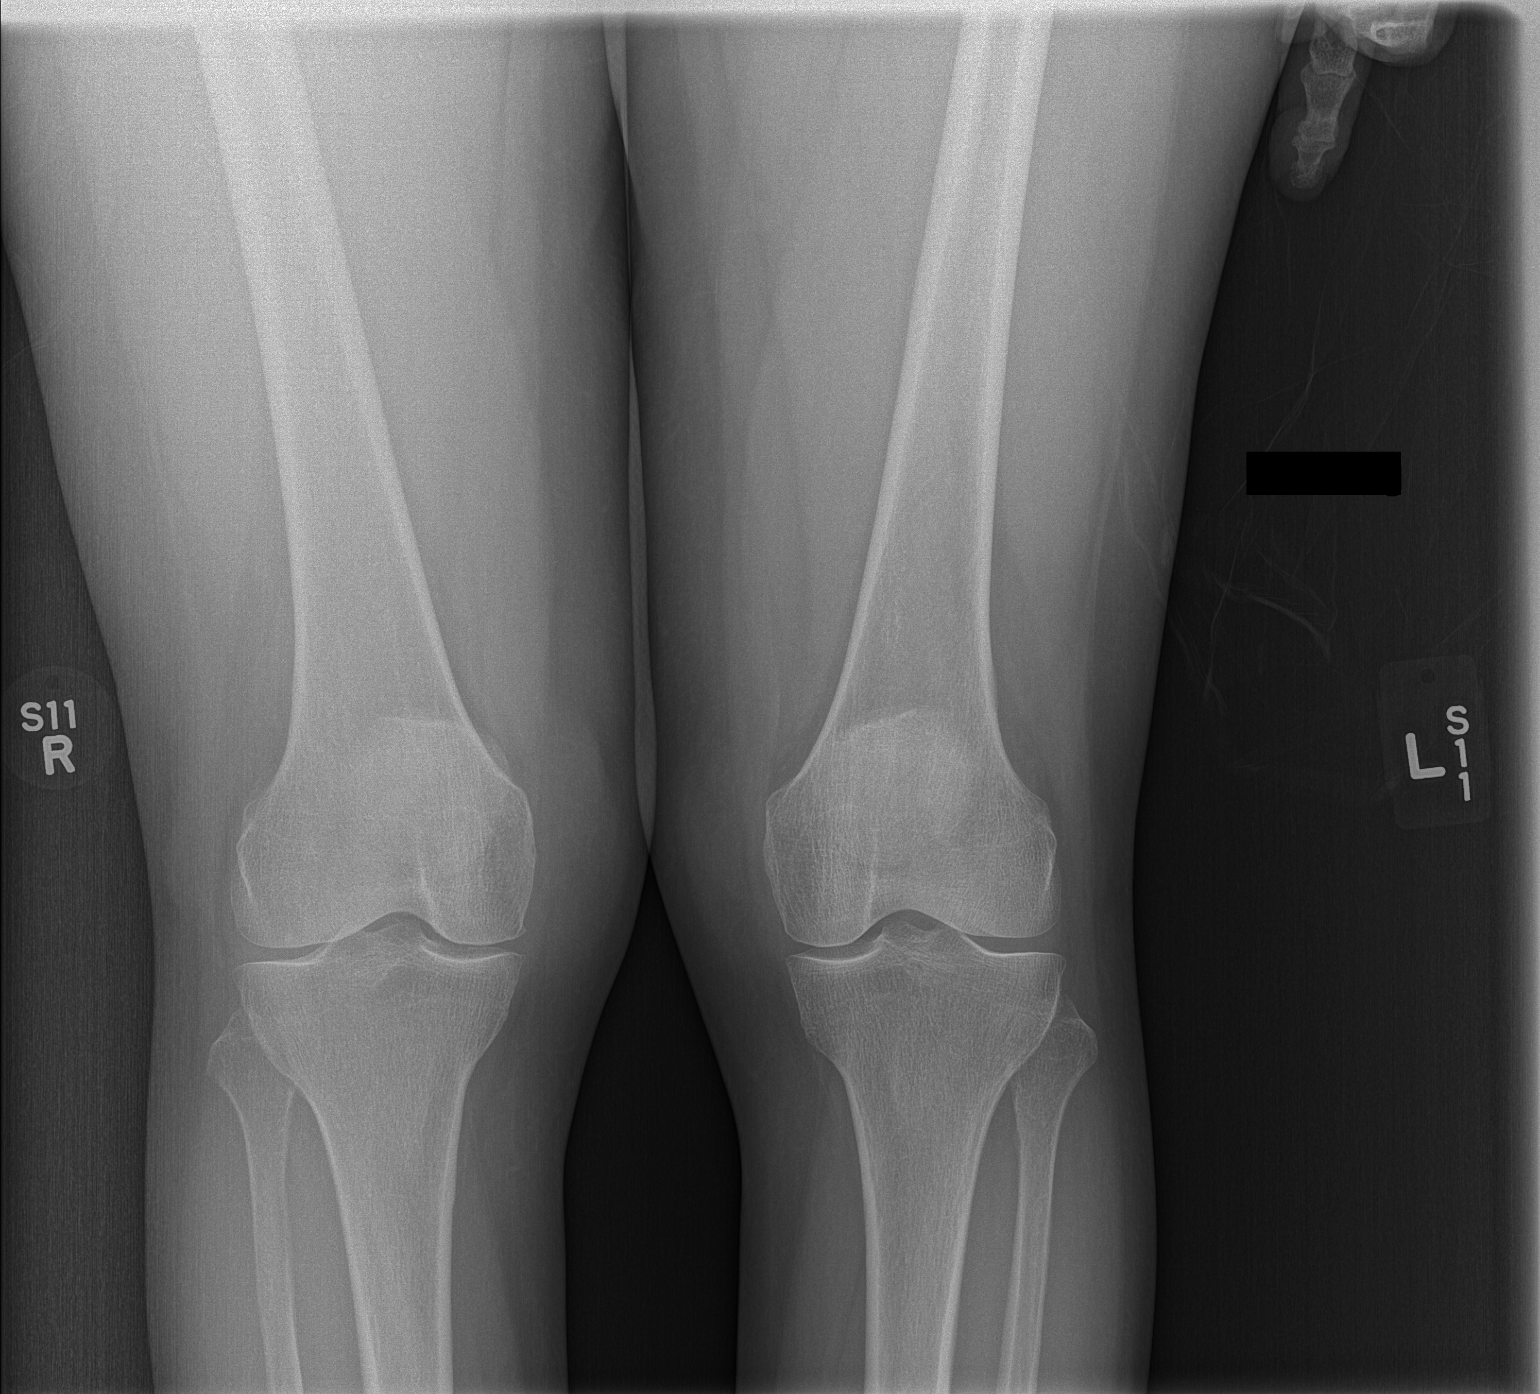

[1 of 1 positions shown; findings below may reference images not displayed]

FINDINGS: No fracture or bone lesion. The joint spaces are well maintained.
Minimal marginal osteophytes are noted from the right knee medial
compartment. No other arthropathic change. No evidence of a joint
effusion. Normal soft tissues.
IMPRESSION: Minimal degenerative change of the medial compartment of the right
knee. No other abnormality.

## 2012-11-21 ENCOUNTER — Ambulatory Visit (INDEPENDENT_AMBULATORY_CARE_PROVIDER_SITE_OTHER): Payer: Medicare Other | Admitting: *Deleted

## 2012-11-21 DIAGNOSIS — Z23 Encounter for immunization: Secondary | ICD-10-CM

## 2012-11-27 ENCOUNTER — Encounter: Payer: Self-pay | Admitting: Family Medicine

## 2012-12-31 DIAGNOSIS — H524 Presbyopia: Secondary | ICD-10-CM | POA: Diagnosis not present

## 2012-12-31 DIAGNOSIS — H2510 Age-related nuclear cataract, unspecified eye: Secondary | ICD-10-CM | POA: Diagnosis not present

## 2013-02-01 DIAGNOSIS — Z1231 Encounter for screening mammogram for malignant neoplasm of breast: Secondary | ICD-10-CM | POA: Diagnosis not present

## 2013-06-12 DIAGNOSIS — I781 Nevus, non-neoplastic: Secondary | ICD-10-CM | POA: Diagnosis not present

## 2013-06-12 DIAGNOSIS — L821 Other seborrheic keratosis: Secondary | ICD-10-CM | POA: Diagnosis not present

## 2013-06-12 DIAGNOSIS — L820 Inflamed seborrheic keratosis: Secondary | ICD-10-CM | POA: Diagnosis not present

## 2013-06-12 DIAGNOSIS — D235 Other benign neoplasm of skin of trunk: Secondary | ICD-10-CM | POA: Diagnosis not present

## 2013-07-08 DIAGNOSIS — L821 Other seborrheic keratosis: Secondary | ICD-10-CM | POA: Diagnosis not present

## 2013-08-21 ENCOUNTER — Ambulatory Visit (INDEPENDENT_AMBULATORY_CARE_PROVIDER_SITE_OTHER): Payer: Medicare Other | Admitting: Family Medicine

## 2013-08-21 ENCOUNTER — Encounter: Payer: Self-pay | Admitting: Family Medicine

## 2013-08-21 DIAGNOSIS — Z23 Encounter for immunization: Secondary | ICD-10-CM

## 2013-08-21 DIAGNOSIS — E785 Hyperlipidemia, unspecified: Secondary | ICD-10-CM | POA: Diagnosis not present

## 2013-08-21 DIAGNOSIS — D563 Thalassemia minor: Secondary | ICD-10-CM | POA: Diagnosis not present

## 2013-08-21 LAB — FERRITIN: Ferritin: 830 ng/mL — ABNORMAL HIGH (ref 10–291)

## 2013-08-21 LAB — CBC
HCT: 28.4 % — ABNORMAL LOW (ref 36.0–46.0)
Hemoglobin: 9.3 g/dL — ABNORMAL LOW (ref 12.0–15.0)
MCH: 17.9 pg — ABNORMAL LOW (ref 26.0–34.0)
MCHC: 32.7 g/dL (ref 30.0–36.0)
MCV: 54.7 fL — ABNORMAL LOW (ref 78.0–100.0)
Platelets: 297 10*3/uL (ref 150–400)
RBC: 5.19 MIL/uL — ABNORMAL HIGH (ref 3.87–5.11)
RDW: 19.9 % — ABNORMAL HIGH (ref 11.5–15.5)
WBC: 6.6 10*3/uL (ref 4.0–10.5)

## 2013-08-21 LAB — LIPID PANEL
Cholesterol: 225 mg/dL — ABNORMAL HIGH (ref 0–200)
LDL Cholesterol: 127 mg/dL — ABNORMAL HIGH (ref 0–99)
Total CHOL/HDL Ratio: 2.9 Ratio
Triglycerides: 104 mg/dL (ref <150)
VLDL: 21 mg/dL (ref 0–40)

## 2013-08-21 NOTE — Assessment & Plan Note (Signed)
Seems well controlled.  Will check labs

## 2013-08-21 NOTE — Assessment & Plan Note (Signed)
Well controlled with diet will recheck labs

## 2013-08-21 NOTE — Progress Notes (Signed)
  Subjective:    Patient ID: Hannah Landry, female    DOB: December 28, 1945, 67 y.o.   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    Hyperlipidemia - no medication watching her diet and exercising  Patient reports no  vision/ hearing changes,anorexia, weight change, fever ,adenopathy, persistant / recurrent hoarseness, swallowing issues, chest pain, edema,persistant / recurrent cough, hemoptysis, dyspnea(rest, exertional, paroxysmal nocturnal), gastrointestinal  bleeding (melena, rectal bleeding), abdominal pain, excessive heart burn, GU symptoms(dysuria, hematuria, pyuria, voiding/incontinence  Issues) syncope, focal weakness, severe memory loss, concerning skin lesions, depression, anxiety, abnormal bruising/bleeding, major joint swelling, breast masses or abnormal vaginal bleeding.    Exercises - walks up and down steps in 3 story house.  Uses treadmill occasionally   Review of Systems     Objective:   Physical Exam  Alert no acute distress Neck:  No deformities, thyromegaly, masses, or tenderness noted.   Supple with full range of motion without pain. Heart - Regular rate and rhythm.  No murmurs, gallops or rubs.    Lungs:  Normal respiratory effort, chest expands symmetrically. Lungs are clear to auscultation, no crackles or wheezes. Abdomen: soft and non-tender without masses, organomegaly or hernias noted.  No guarding or rebound Extremities:  No cyanosis, edema, or deformity noted with good range of motion of all major joints.   Skin:  Intact without suspicious lesions or rashes       Assessment & Plan:

## 2013-08-22 ENCOUNTER — Encounter: Payer: Self-pay | Admitting: Family Medicine

## 2013-09-03 DIAGNOSIS — Z124 Encounter for screening for malignant neoplasm of cervix: Secondary | ICD-10-CM | POA: Diagnosis not present

## 2013-12-02 ENCOUNTER — Ambulatory Visit (INDEPENDENT_AMBULATORY_CARE_PROVIDER_SITE_OTHER): Payer: Medicare Other | Admitting: Family Medicine

## 2013-12-02 ENCOUNTER — Encounter: Payer: Self-pay | Admitting: Family Medicine

## 2013-12-02 VITALS — BP 145/63 | HR 79 | Temp 99.4°F | Ht 61.0 in | Wt 132.0 lb

## 2013-12-02 DIAGNOSIS — R202 Paresthesia of skin: Secondary | ICD-10-CM

## 2013-12-02 DIAGNOSIS — R209 Unspecified disturbances of skin sensation: Secondary | ICD-10-CM | POA: Diagnosis not present

## 2013-12-02 DIAGNOSIS — M25569 Pain in unspecified knee: Secondary | ICD-10-CM

## 2013-12-02 LAB — CBC
HCT: 29 % — ABNORMAL LOW (ref 36.0–46.0)
Hemoglobin: 9.1 g/dL — ABNORMAL LOW (ref 12.0–15.0)
MCH: 17.4 pg — ABNORMAL LOW (ref 26.0–34.0)
MCHC: 31.4 g/dL (ref 30.0–36.0)
MCV: 55.4 fL — ABNORMAL LOW (ref 78.0–100.0)
Platelets: 308 10*3/uL (ref 150–400)
RBC: 5.23 MIL/uL — ABNORMAL HIGH (ref 3.87–5.11)
RDW: 20.6 % — ABNORMAL HIGH (ref 11.5–15.5)
WBC: 6.9 10*3/uL (ref 4.0–10.5)

## 2013-12-02 LAB — COMPREHENSIVE METABOLIC PANEL
ALT: 15 U/L (ref 0–35)
AST: 17 U/L (ref 0–37)
Albumin: 4.8 g/dL (ref 3.5–5.2)
Alkaline Phosphatase: 72 U/L (ref 39–117)
BUN: 14 mg/dL (ref 6–23)
CALCIUM: 9.6 mg/dL (ref 8.4–10.5)
CHLORIDE: 104 meq/L (ref 96–112)
CO2: 29 mEq/L (ref 19–32)
Creat: 0.81 mg/dL (ref 0.50–1.10)
Glucose, Bld: 89 mg/dL (ref 70–99)
POTASSIUM: 4.5 meq/L (ref 3.5–5.3)
SODIUM: 141 meq/L (ref 135–145)
Total Bilirubin: 1.2 mg/dL (ref 0.3–1.2)
Total Protein: 6.9 g/dL (ref 6.0–8.3)

## 2013-12-02 MED ORDER — IBUPROFEN 800 MG PO TABS: 800.0000 mg | ORAL_TABLET | Freq: Three times a day (TID) | ORAL | Status: DC | PRN

## 2013-12-02 NOTE — Patient Instructions (Signed)
I am not sure what is causing your knee pain. I want to check some labs. Let's also give you some ibuprofen 800mg  for pain. Let's check in -about 2 weeks from now. I will give you a call about the results when available or send you a message through mychart.   Thanks, Dr. Yong Channel  Health Maintenance Due  Topic Date Due  . Tetanus/tdap  10/18/1964

## 2013-12-02 NOTE — Progress Notes (Signed)
Hannah Reddish, MD Phone: [phone]  Subjective:  Chief complaint-noted  Knee Pain Patient complains of bilateral knee pain. Patient states about a week ago she started with tingling in her lower shins. This has happened before and was frequent in prolonged in the past when Ferritin was near 1500 per patient. Since ferritin has been in 800s it will only intermittently happen for a day or two. She has been taking folic acid and reducing leafy green vegetables as instructed by hem/onc. Last ferritin was in 800s and last hgb was around 9 as per her baseline. 2 days ago the tingling migrated up to the base of her knees and then she states she started having severe knee pain. All of these events are focused at night and fade in the daytime. 2 nights, she was unable to sleep so got up and tried tylenol and ibuprofen with little relief. Last night, she walked around and used a heating pad with only minimal relief. Patient states pain up to 10/10. At present about a 3 and sometimes lower in the daytime. She did use a stationary bike for about 10 minutes yesterday but otherwise has not been particularly active. Pain not worse with standing or after standing.   ROS-no locking/popping/giving way. No leg weakness. No knee stiffness or joint stiffness in the AM. No history of any autoimmune disease. Denies back pain/fecal or urinary incontinence/saddle anesthesia. No fever/fatigue. Feels well otherwise.   Past Medical History Patient Active Problem List   Diagnosis Date Noted  . Hyperlipidemia 05/09/2012  . 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 HEART MURMUR, BENIGN 05/05/2010    Medications- reviewed and updated Current Outpatient Prescriptions  Medication Sig Dispense Refill  . Calcium Carbonate-Vitamin D 600-400 MG-UNIT per tablet Take 1 tablet by mouth daily.       . folic acid (FOLVITE) 371 MCG tablet Take 400 mcg by mouth daily.        Marland Kitchen ibuprofen (ADVIL,MOTRIN) 800 MG tablet Take 1 tablet (800 mg  total) by mouth every 8 (eight) hours as needed.  30 tablet  0  . loratadine (CLARITIN) 10 MG tablet Take 10 mg by mouth daily.      . Multiple Vitamins-Minerals (QC WOMENS DAILY MULTIVITAMIN) TABS Take 1 tablet by mouth daily.         No current facility-administered medications for this visit.    Objective: BP 145/63  Pulse 79  Temp(Src) 99.4 F (37.4 C) (Oral)  Ht 5\' 1"  (1.549 m)  Wt 132 lb (59.875 kg)  BMI 24.95 kg/m2 Gen: NAD, resting comfortably on table  CV: RRR no murmurs rubs or gallops Lungs: CTAB no crackles, wheeze, rhonchi Abdomen: soft/nontender MSK of bilateral Knee: No abnormalities found Normal to inspection with no erythema or effusion or obvious bony abnormalities. Palpation normal with no warmth or joint line tenderness or patellar tenderness or condyle tenderness. No crepitus ROM normal in flexion and extension and lower leg rotation. Ligaments with solid consistent endpoints including ACL, PCL, LCL, MCL. Negative Mcmurray's (click noted but no pain). Non painful patellar compression. Patellar and quadriceps tendons unremarkable. Neuro: grossly normal, moves all extremities. Hamstring and quadriceps strength is normal.  Assessment/Plan:  Knee Pain (bilateral) Unclear etiology. Essentially normal knee exam. Sudden onset does not agree with osteoarthritis (also patient not obese and no history of arthritis). Tingling has occurred before with high ferritin so will check ferritin. With paresthesias, will also check CBC and CMET (? Secondary hemochromatosis), TSH. Gave patient 800mg  ibuprofen for symptomatic relief. Advised f/u  in 2 weeks. Discussed possible autoimmune workup but we both felt this was early and would discuss at future visit. Patient also states may be interested in films of knee but will hold off at this time based off history and physical not c/w arthritis.   Orders Placed This Encounter  Procedures  . CBC  . Comprehensive metabolic panel  . TSH   . Ferritin    Meds ordered this encounter  Medications  . ibuprofen (ADVIL,MOTRIN) 800 MG tablet    Sig: Take 1 tablet (800 mg total) by mouth every 8 (eight) hours as needed.    Dispense:  30 tablet    Refill:  0

## 2013-12-03 ENCOUNTER — Ambulatory Visit (HOSPITAL_COMMUNITY)
Admission: RE | Admit: 2013-12-03 | Discharge: 2013-12-03 | Disposition: A | Discharge status: Home / Self Care | Payer: Medicare Other | Source: Ambulatory Visit | Attending: Family Medicine | Admitting: Family Medicine

## 2013-12-03 ENCOUNTER — Telehealth: Payer: Self-pay | Admitting: Family Medicine

## 2013-12-03 DIAGNOSIS — M79609 Pain in unspecified limb: Secondary | ICD-10-CM | POA: Diagnosis not present

## 2013-12-03 DIAGNOSIS — M25562 Pain in left knee: Secondary | ICD-10-CM

## 2013-12-03 DIAGNOSIS — M25569 Pain in unspecified knee: Secondary | ICD-10-CM | POA: Diagnosis not present

## 2013-12-03 DIAGNOSIS — M1710 Unilateral primary osteoarthritis, unspecified knee: Secondary | ICD-10-CM | POA: Diagnosis not present

## 2013-12-03 DIAGNOSIS — M25561 Pain in right knee: Secondary | ICD-10-CM

## 2013-12-03 DIAGNOSIS — M79669 Pain in unspecified lower leg: Secondary | ICD-10-CM

## 2013-12-03 DIAGNOSIS — IMO0002 Reserved for concepts with insufficient information to code with codable children: Secondary | ICD-10-CM | POA: Diagnosis not present

## 2013-12-03 LAB — TSH: TSH: 1.416 u[IU]/mL (ref 0.350–4.500)

## 2013-12-03 LAB — FERRITIN: Ferritin: 748 ng/mL — ABNORMAL HIGH (ref 10–291)

## 2013-12-03 IMAGING — CR DG TIBIA/FIBULA 2V*L*
3 series · 3 of 3 positions shown · non-contrast
Comparison: Bilateral knees dated [DATE]

CLINICAL DATA: Bilateral knee pain.  Extending toward the ankles

EXAM:
LEFT TIBIA AND FIBULA - 2 VIEW

[x tib-fib ap left]
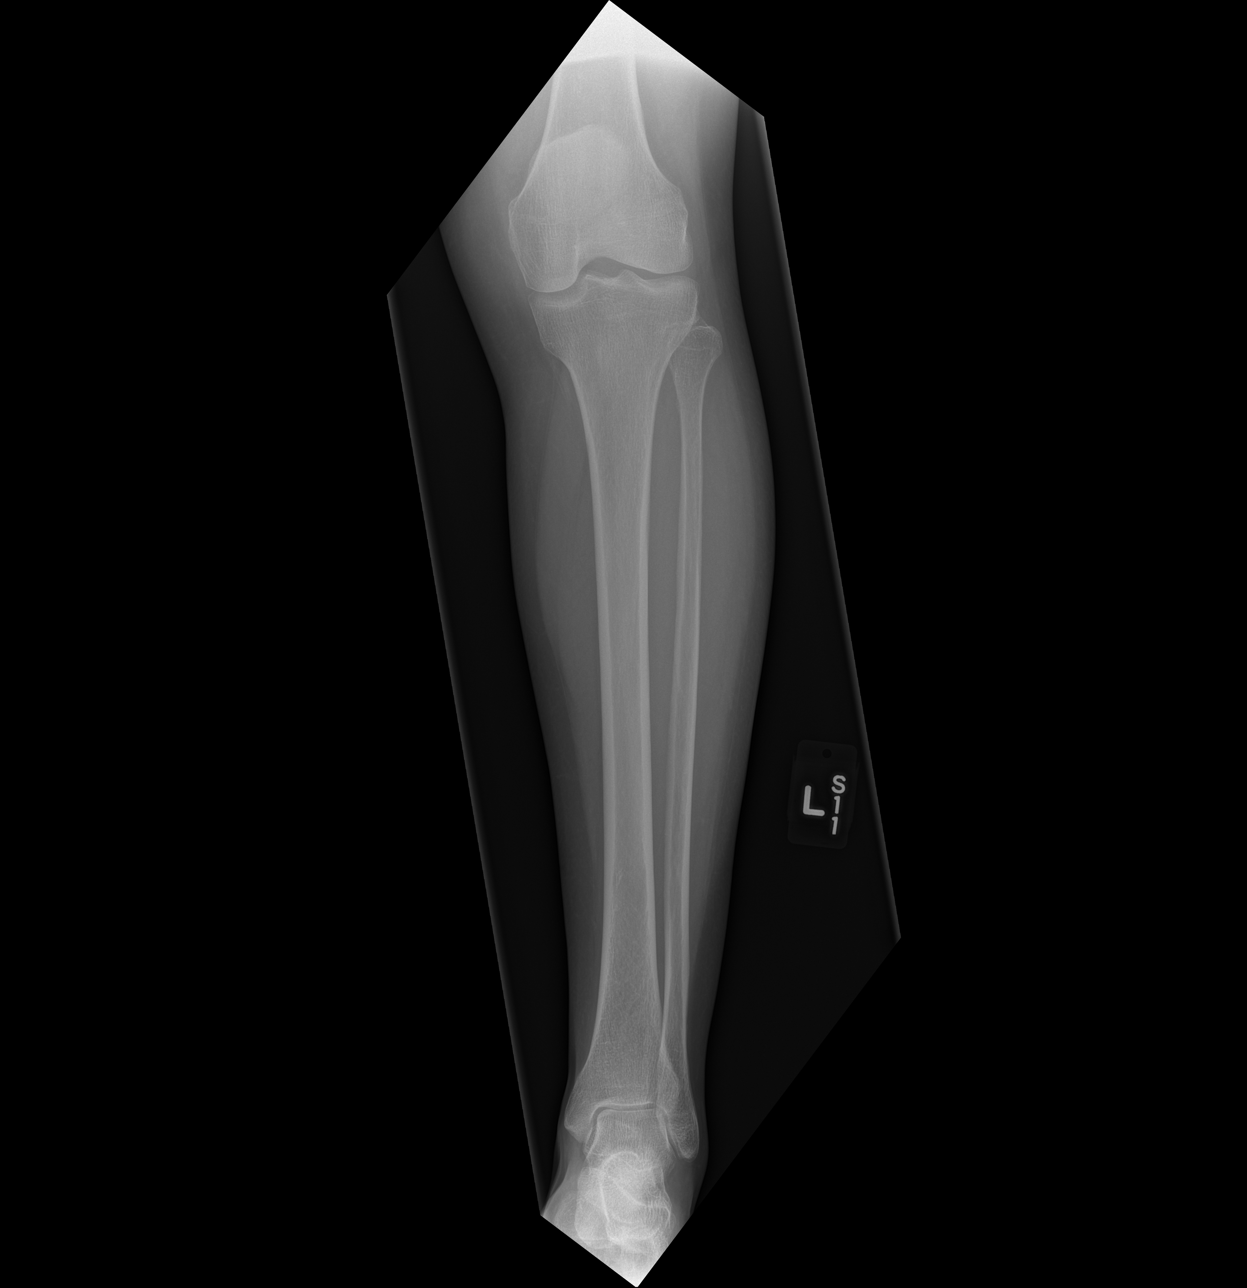

[x tib-fib lat left (1 of 2)]
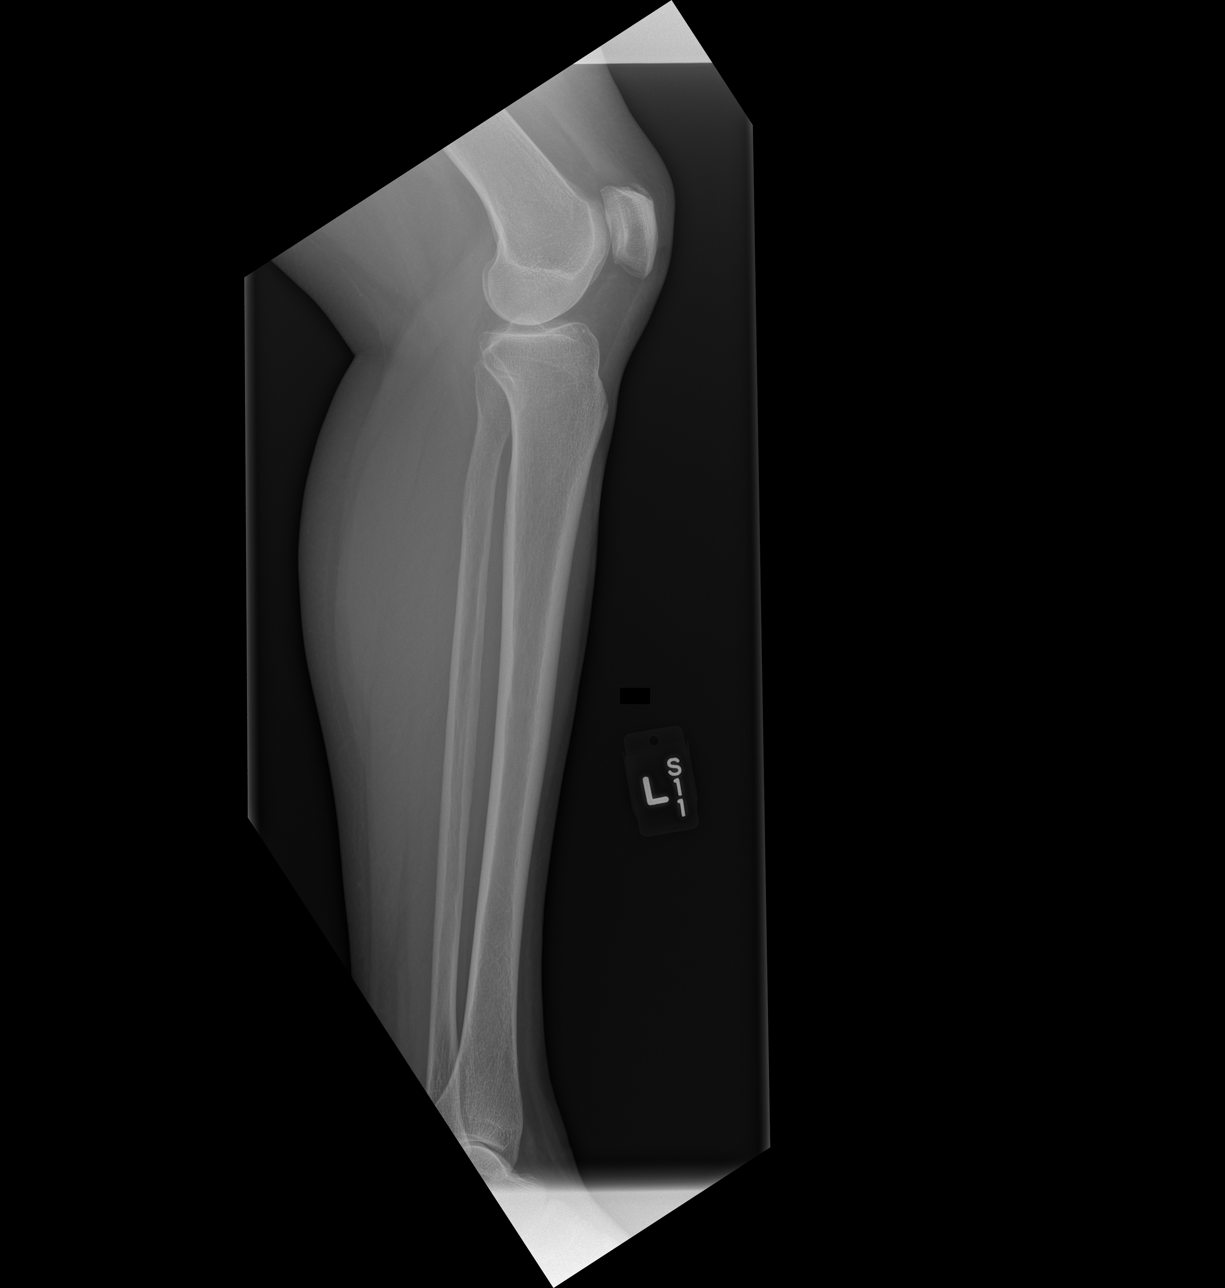

[x tib-fib lat left (2 of 2)]
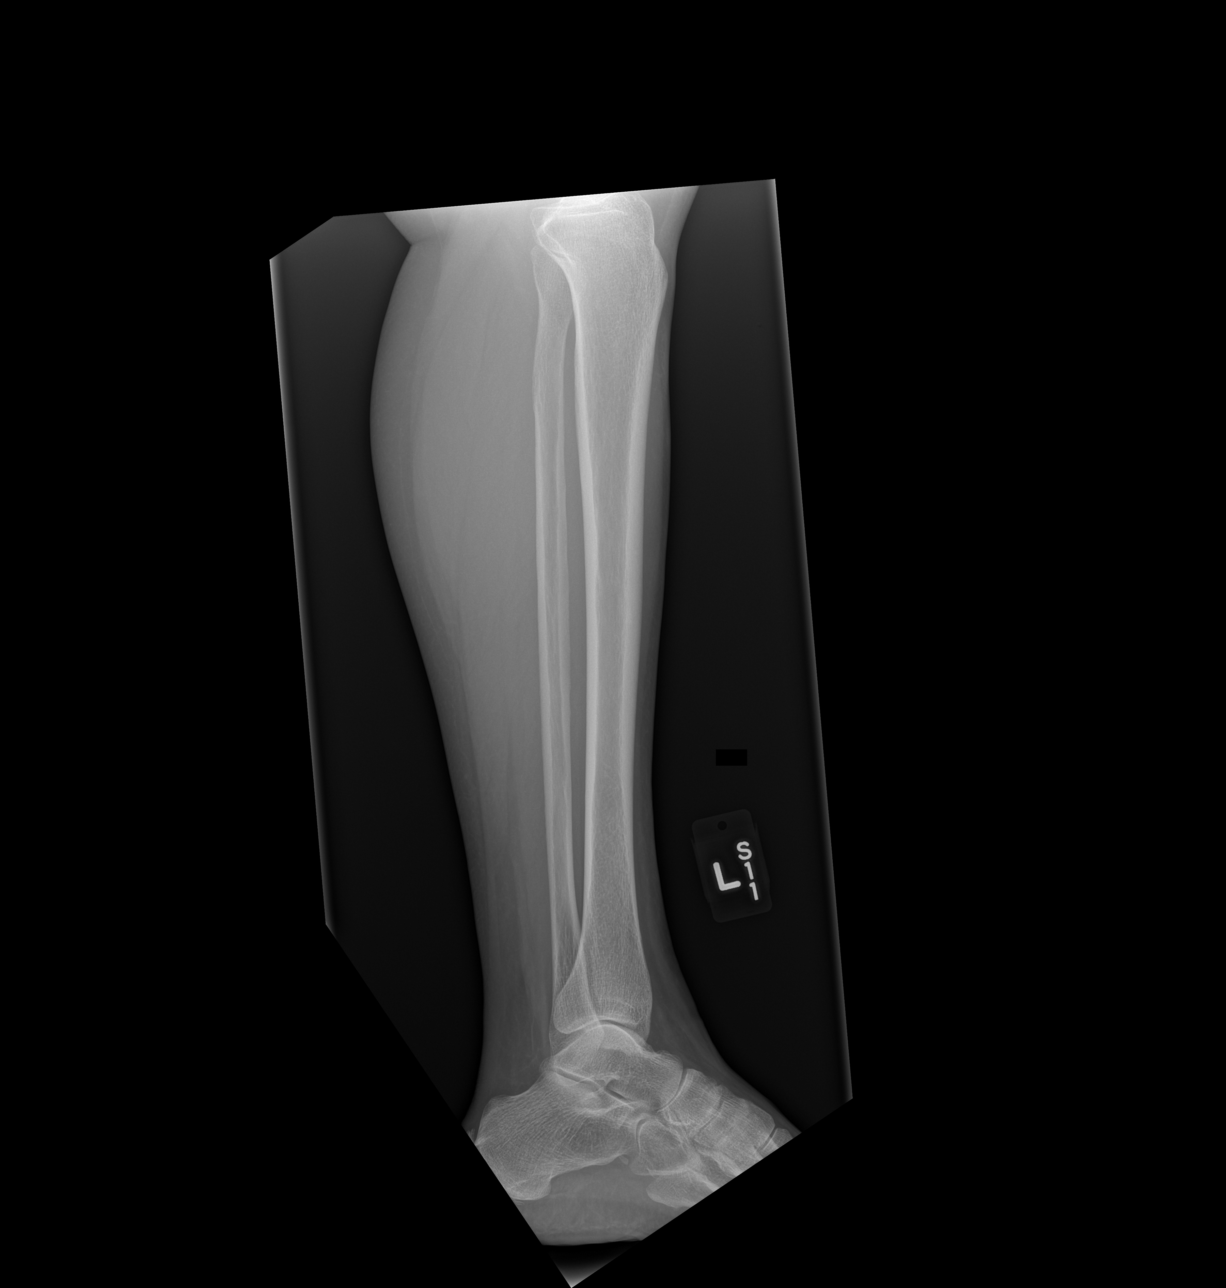

[3 of 3 positions shown; findings below may reference images not displayed]

FINDINGS: AP and lateral views of the left tibia and fibula reveal the bones
to be adequately mineralized. There is no acute fracture or
dislocation. There is no lytic or blastic lesion. There is no
periosteal reaction. The observed portions of the knee and ankle
exhibit no acute abnormalities.
IMPRESSION: There is no acute bony abnormality of the left tibia or fibula. The
overlying soft tissues are grossly normal as well. Sign rib

## 2013-12-03 IMAGING — CR DG TIBIA/FIBULA 2V*R*
3 series · 3 of 3 positions shown · non-contrast
Comparison: None.

CLINICAL DATA: Right lower leg pain.

EXAM:
RIGHT TIBIA AND FIBULA - 2 VIEW

[x tib-fib ap right (1 of 2)]
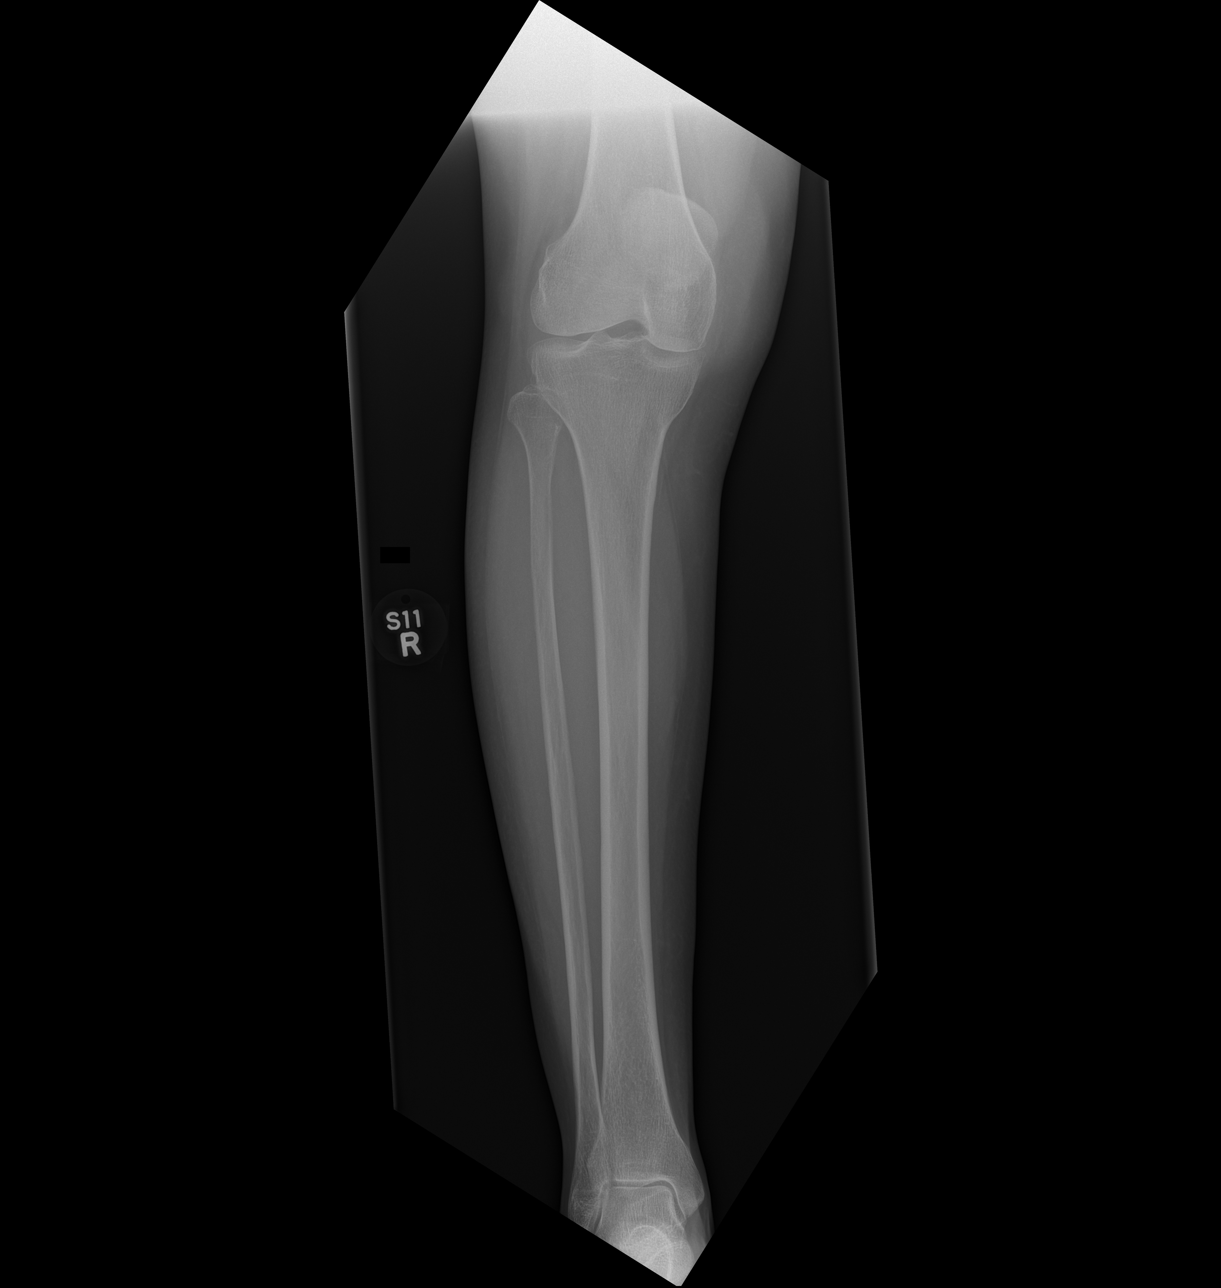

[x tib-fib ap right (2 of 2)]
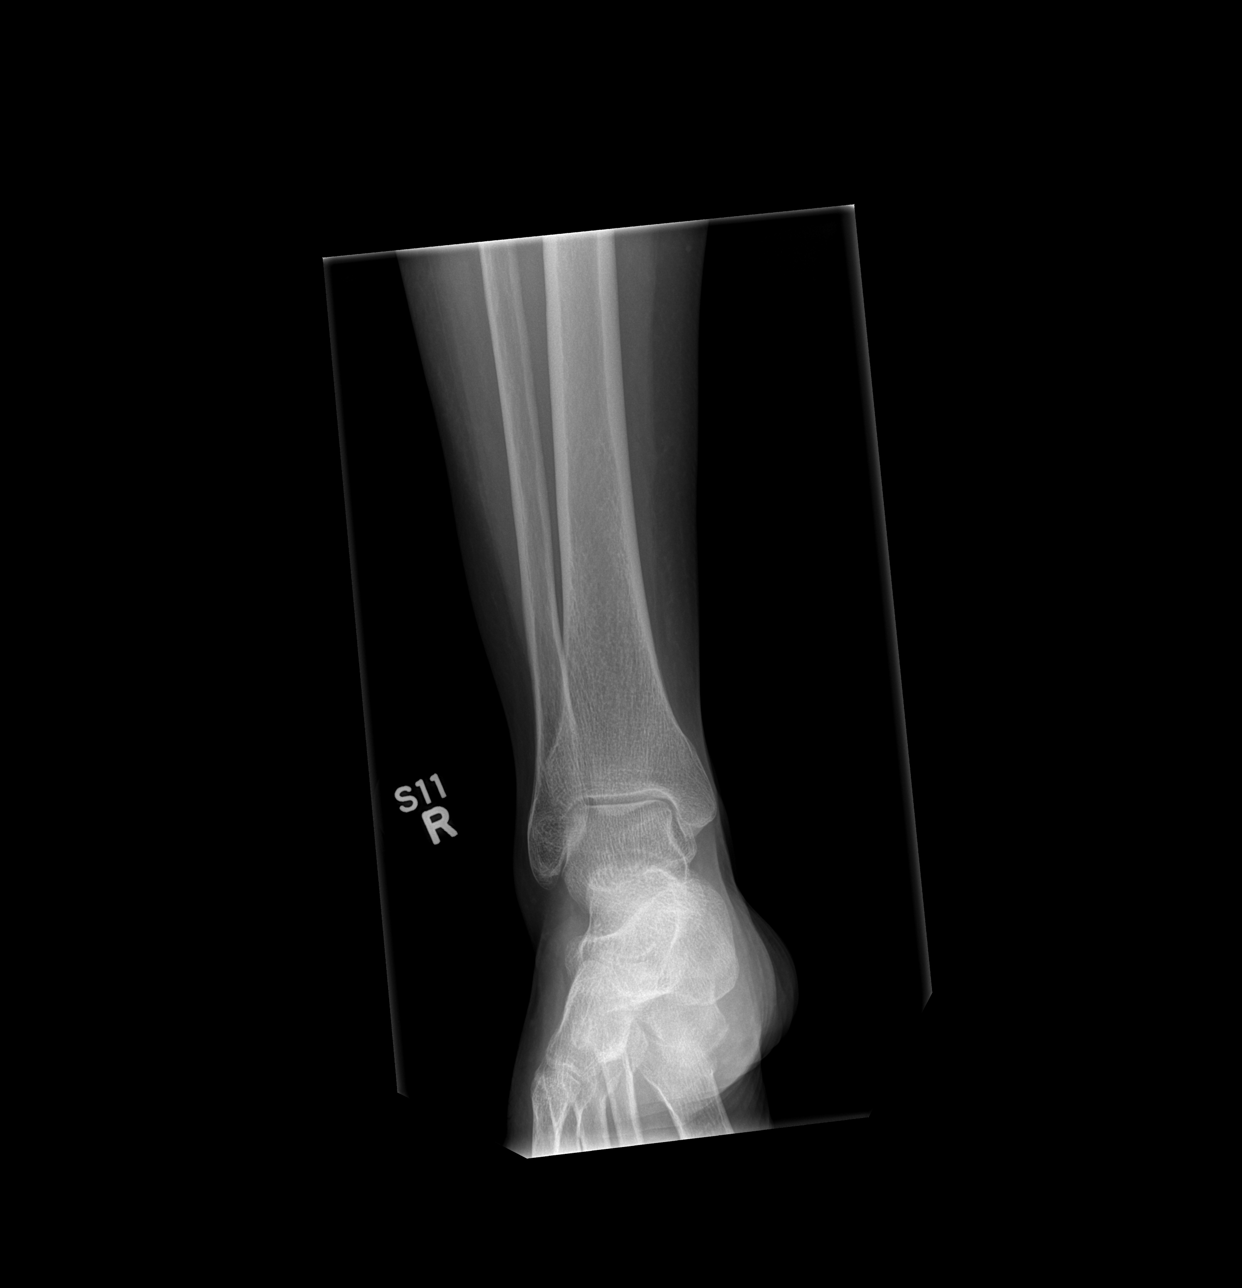

[x tib-fib lat right]
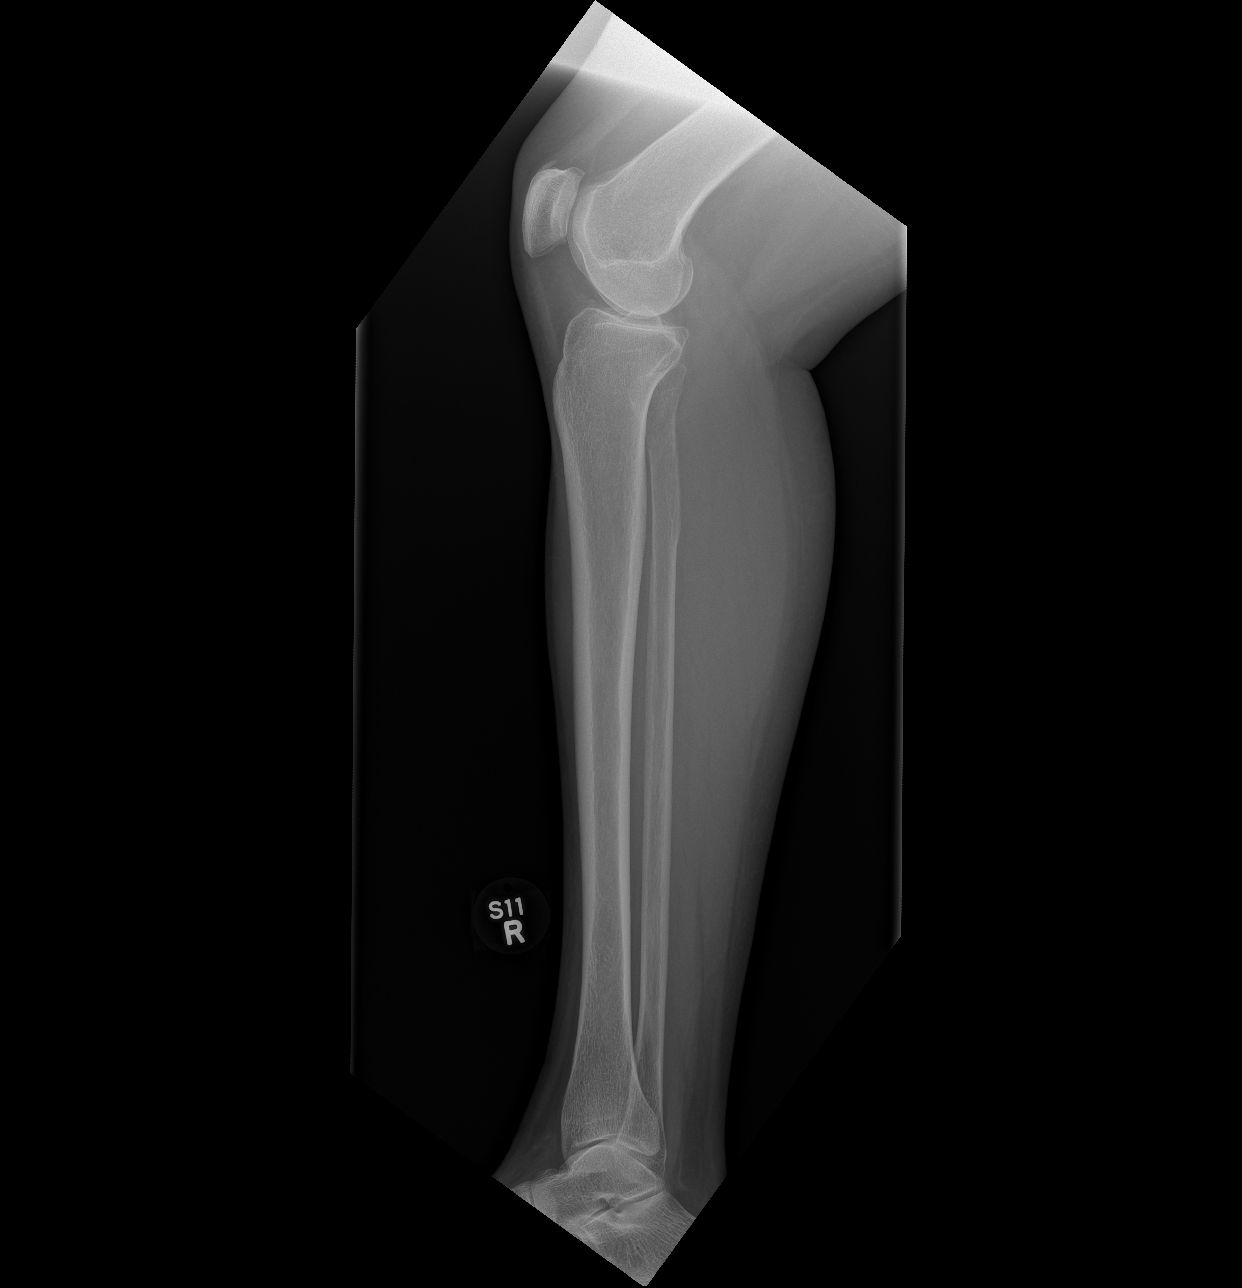

[3 of 3 positions shown; findings below may reference images not displayed]

FINDINGS: Imaged bones, joints and soft tissues appear normal.
IMPRESSION: Negative exam.

## 2013-12-03 MED ORDER — TRAMADOL HCL 50 MG PO TABS: 25.0000 mg | ORAL_TABLET | Freq: Four times a day (QID) | ORAL | Status: DC | PRN

## 2013-12-03 NOTE — Telephone Encounter (Signed)
Called patient and reviewed labs which were stable from previous. Patient noted she called this morning due to pain not being relieved by ibuprofen. She took a dose at 2:30 PM and 10:30 Pm and then woke up around 1-2 AM still with severe pain though less than yesterday.   Patient would like to have x-rays of her knees and lower legs (says pain shifted back into shins last night). She would also like something stronger for pain. I have ordered both of these and patient will have both completed today.

## 2013-12-03 NOTE — Telephone Encounter (Signed)
Seen by Dr. Yong Channel yesterday and meds that were prescribed are not working. She states she was unable to sleep last night due to pain. She states that Dr. Yong Channel mentioned possibly trying Tramadol for pain. Patient would like this prescribed. Please advise.

## 2013-12-09 ENCOUNTER — Encounter: Payer: Self-pay | Admitting: Family Medicine

## 2013-12-18 ENCOUNTER — Encounter: Payer: Self-pay | Admitting: Family Medicine

## 2013-12-18 ENCOUNTER — Ambulatory Visit (INDEPENDENT_AMBULATORY_CARE_PROVIDER_SITE_OTHER): Payer: Medicare Other | Admitting: Family Medicine

## 2013-12-18 VITALS — BP 142/68 | HR 72 | Temp 98.6°F | Ht 61.0 in | Wt 132.0 lb

## 2013-12-18 DIAGNOSIS — M79606 Pain in leg, unspecified: Secondary | ICD-10-CM | POA: Insufficient documentation

## 2013-12-18 DIAGNOSIS — M79609 Pain in unspecified limb: Secondary | ICD-10-CM

## 2013-12-18 NOTE — Patient Instructions (Signed)
Good to see you today!  Thanks for coming in.  Call me if you have any recurrence of the pain or if the numbness seems to be progressing

## 2013-12-18 NOTE — Progress Notes (Signed)
   Subjective:    Patient ID: Hannah Landry, female    DOB: [birth year], 68 y.o.   MRN: [id number]  HPI  Knee Leg tingling pain Has nearly completely resolved.  Back to very mild tingling feeling over anter lower leg at night which she has had for a long time.  No rash, joint swelling, sores or skin changes  Review of Symptoms - see HPI  PMH - Smoking status noted.     Review of Systems     Objective:   Physical Exam Alert no acute distress Extremities:  No cyanosis, edema, or deformity noted with good range of motion of all major joints.   Pulses - 2+ DP and post tib bilaterally.  Normal skin.  Normal monofilament sensation bilateral feet        Assessment & Plan:

## 2013-12-18 NOTE — Assessment & Plan Note (Signed)
Nearly resovled Unsure of cause but likely transient neuropathy.  No signs of arthritis or circulatory problems Will monitor for recurrence

## 2014-01-20 DIAGNOSIS — H2510 Age-related nuclear cataract, unspecified eye: Secondary | ICD-10-CM | POA: Diagnosis not present

## 2014-01-20 DIAGNOSIS — H40019 Open angle with borderline findings, low risk, unspecified eye: Secondary | ICD-10-CM | POA: Diagnosis not present

## 2014-02-06 DIAGNOSIS — Z1231 Encounter for screening mammogram for malignant neoplasm of breast: Secondary | ICD-10-CM | POA: Diagnosis not present

## 2014-02-27 DIAGNOSIS — H40019 Open angle with borderline findings, low risk, unspecified eye: Secondary | ICD-10-CM | POA: Diagnosis not present

## 2014-02-27 DIAGNOSIS — H2510 Age-related nuclear cataract, unspecified eye: Secondary | ICD-10-CM | POA: Diagnosis not present

## 2014-08-27 ENCOUNTER — Ambulatory Visit (INDEPENDENT_AMBULATORY_CARE_PROVIDER_SITE_OTHER): Payer: Medicare Other | Admitting: Family Medicine

## 2014-08-27 ENCOUNTER — Encounter: Payer: Self-pay | Admitting: Family Medicine

## 2014-08-27 VITALS — BP 148/70 | HR 67 | Temp 98.2°F | Ht 61.0 in | Wt 129.9 lb

## 2014-08-27 DIAGNOSIS — Z13820 Encounter for screening for osteoporosis: Secondary | ICD-10-CM | POA: Insufficient documentation

## 2014-08-27 DIAGNOSIS — E785 Hyperlipidemia, unspecified: Secondary | ICD-10-CM

## 2014-08-27 DIAGNOSIS — Z23 Encounter for immunization: Secondary | ICD-10-CM

## 2014-08-27 LAB — LIPID PANEL
Cholesterol: 208 mg/dL — ABNORMAL HIGH (ref 0–200)
HDL: 73 mg/dL (ref >39)
LDL CALC: 115 mg/dL — AB (ref 0–99)
Total CHOL/HDL Ratio: 2.8 Ratio
Triglycerides: 100 mg/dL (ref <150)
VLDL: 20 mg/dL (ref 0–40)

## 2014-08-27 NOTE — Progress Notes (Signed)
   Subjective:    Patient ID: Hannah Landry, female    DOB: [birth year], 68 y.o.   MRN: [id number]  HPI  Feels well  Knee pain - resolved  HYPERLIPIDEMIA Symptoms Chest pain on exertion:  no   Leg claudication:   no Medications: Compliance- diet cntrolled Right upper quadrant pain- no  Muscle aches- no     Component Value Date/Time   CHOL 225* 08/21/2013 0956   TRIG 104 08/21/2013 0956   HDL 77 08/21/2013 0956   VLDL 21 08/21/2013 0956   CHOLHDL 2.9 08/21/2013 0956     Patient reports no  vision/ hearing changes,anorexia, weight change, fever ,adenopathy, persistant / recurrent hoarseness, swallowing issues, chest pain, edema,persistant / recurrent cough, hemoptysis, dyspnea(rest, exertional, paroxysmal nocturnal), gastrointestinal  bleeding (melena, rectal bleeding), abdominal pain, excessive heart burn, GU symptoms(dysuria, hematuria, pyuria, voiding/incontinence  Issues) syncope, focal weakness, severe memory loss, concerning skin lesions, depression, anxiety, abnormal bruising/bleeding, major joint swelling, breast masses or abnormal vaginal bleeding.     Review of Systems     Objective:   Physical Exam  Alert no acute distress Heart - Regular rate and rhythm.  No murmurs, gallops or rubs.    Lungs:  Normal respiratory effort, chest expands symmetrically. Lungs are clear to auscultation, no crackles or wheezes. Abdomen: soft and non-tender without masses, organomegaly or hernias noted.  No guarding or rebound Extremities:  No cyanosis, edema, or deformity noted with good range of motion of all major joints.   Ears:  External ear exam shows no significant lesions or deformities.  Otoscopic examination reveals clear canals, tympanic membranes are intact bilaterally without bulging, retraction, inflammation or discharge. Hearing is grossly normal bilaterall Neck:  No deformities, thyromegaly, masses, or tenderness noted.   Supple with full range of motion without pain.         Assessment & Plan:   Normal exam Check BD screening

## 2014-08-27 NOTE — Patient Instructions (Signed)
Good to see you today!  Thanks for coming in.  I will call you if your tests are not good.  Otherwise I will send you a letter.  If you do not hear from me with in 2 weeks please call our office.     Take the order for the bone density to your former place - [facility] if they need any other paperwork  Keep doing what you are doing  Increase fiber and roughage and softner every day.   If the constipation is worse then come back

## 2014-08-27 NOTE — Assessment & Plan Note (Signed)
Maintaining weight Check labs

## 2014-08-28 ENCOUNTER — Encounter: Payer: Self-pay | Admitting: Family Medicine

## 2014-09-11 DIAGNOSIS — N958 Other specified menopausal and perimenopausal disorders: Secondary | ICD-10-CM | POA: Diagnosis not present

## 2014-09-11 DIAGNOSIS — M8588 Other specified disorders of bone density and structure, other site: Secondary | ICD-10-CM | POA: Diagnosis not present

## 2014-10-02 NOTE — Addendum Note (Signed)
Addended by: Talbert Cage L on: 10/02/2014 02:40 PM   Modules accepted: Level of Service

## 2015-02-09 DIAGNOSIS — Z1231 Encounter for screening mammogram for malignant neoplasm of breast: Secondary | ICD-10-CM | POA: Diagnosis not present

## 2015-03-23 ENCOUNTER — Encounter: Payer: Self-pay | Admitting: Family Medicine

## 2015-05-11 ENCOUNTER — Other Ambulatory Visit: Payer: Self-pay

## 2015-05-29 DIAGNOSIS — H52223 Regular astigmatism, bilateral: Secondary | ICD-10-CM | POA: Diagnosis not present

## 2015-05-29 DIAGNOSIS — H40023 Open angle with borderline findings, high risk, bilateral: Secondary | ICD-10-CM | POA: Diagnosis not present

## 2015-05-29 DIAGNOSIS — H2513 Age-related nuclear cataract, bilateral: Secondary | ICD-10-CM | POA: Diagnosis not present

## 2015-05-29 DIAGNOSIS — H524 Presbyopia: Secondary | ICD-10-CM | POA: Diagnosis not present

## 2015-09-09 ENCOUNTER — Encounter: Payer: Self-pay | Admitting: Family Medicine

## 2015-09-09 ENCOUNTER — Ambulatory Visit (INDEPENDENT_AMBULATORY_CARE_PROVIDER_SITE_OTHER): Payer: Medicare Other | Admitting: Family Medicine

## 2015-09-09 VITALS — BP 143/66 | HR 71 | Temp 98.1°F | Ht 61.0 in | Wt 128.4 lb

## 2015-09-09 DIAGNOSIS — Z1159 Encounter for screening for other viral diseases: Secondary | ICD-10-CM

## 2015-09-09 DIAGNOSIS — D563 Thalassemia minor: Secondary | ICD-10-CM

## 2015-09-09 DIAGNOSIS — Z23 Encounter for immunization: Secondary | ICD-10-CM | POA: Diagnosis not present

## 2015-09-09 DIAGNOSIS — E785 Hyperlipidemia, unspecified: Secondary | ICD-10-CM

## 2015-09-09 DIAGNOSIS — Z0000 Encounter for general adult medical examination without abnormal findings: Secondary | ICD-10-CM | POA: Diagnosis not present

## 2015-09-09 LAB — LIPID PANEL
CHOL/HDL RATIO: 2.2 ratio (ref <=5.0)
Cholesterol: 202 mg/dL — ABNORMAL HIGH (ref 125–200)
HDL: 90 mg/dL (ref >=46)
LDL CALC: 93 mg/dL (ref <130)
TRIGLYCERIDES: 97 mg/dL (ref <150)
VLDL: 19 mg/dL (ref <30)

## 2015-09-09 LAB — CBC
HEMATOCRIT: 27.8 % — AB (ref 36.0–46.0)
HEMOGLOBIN: 9.1 g/dL — AB (ref 12.0–15.0)
MCH: 17.9 pg — ABNORMAL LOW (ref 26.0–34.0)
MCHC: 32.7 g/dL (ref 30.0–36.0)
MCV: 54.7 fL — AB (ref 78.0–100.0)
Platelets: 269 10*3/uL (ref 150–400)
RBC: 5.08 MIL/uL (ref 3.87–5.11)
RDW: 18.9 % — ABNORMAL HIGH (ref 11.5–15.5)
WBC: 6.1 10*3/uL (ref 4.0–10.5)

## 2015-09-09 LAB — FERRITIN: Ferritin: 744 ng/mL — ABNORMAL HIGH (ref 10–291)

## 2015-09-09 LAB — HEPATITIS C ANTIBODY: HCV Ab: NEGATIVE

## 2015-09-09 NOTE — Progress Notes (Signed)
Subjective:    Hannah Landry is a 69 y.o. female who presents for Medicare Annual/Subsequent preventive examination.  Preventive Screening-Counseling & Management  Tobacco History  Smoking status  . Never Smoker   Smokeless tobacco  . Not on file     Problems Prior to Visit 1.   Current Problems (verified) Patient Active Problem List   Diagnosis Date Noted  . Osteoporosis screening 08/27/2014  . Hyperlipidemia 05/09/2012  . 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 HEART MURMUR, BENIGN 05/05/2010    Medications Prior to Visit Current Outpatient Prescriptions on File Prior to Visit  Medication Sig Dispense Refill  . Calcium Carbonate-Vitamin D 600-400 MG-UNIT per tablet Take 1 tablet by mouth daily.     . folic acid (FOLVITE) 419 MCG tablet Take 400 mcg by mouth daily.      . Multiple Vitamins-Minerals (QC WOMENS DAILY MULTIVITAMIN) TABS Take 1 tablet by mouth daily.       No current facility-administered medications on file prior to visit.    Current Medications (verified) Current Outpatient Prescriptions  Medication Sig Dispense Refill  . Garlic 6222 MG CAPS Take by mouth.    . Calcium Carbonate-Vitamin D 600-400 MG-UNIT per tablet Take 1 tablet by mouth daily.     . folic acid (FOLVITE) 979 MCG tablet Take 400 mcg by mouth daily.      . Multiple Vitamins-Minerals (QC WOMENS DAILY MULTIVITAMIN) TABS Take 1 tablet by mouth daily.       No current facility-administered medications for this visit.     Allergies (verified) Review of patient's allergies indicates no known allergies.   PAST HISTORY  Family History No family history on file.  Social History Social History  Substance Use Topics  . Smoking status: Never Smoker   . Smokeless tobacco: Not on file  . Alcohol Use: Not on file     Are there smokers in your home (other than you)? No  Risk Factors Current exercise habits: Home exercise routine includes walking 2 hrs per day.  Dietary issues discussed: yes     Cardiac risk factors: none.  Depression Screen (Note: if answer to either of the following is "Yes", a more complete depression screening is indicated)   Over the past two weeks, have you felt down, depressed or hopeless? No  Over the past two weeks, have you felt little interest or pleasure in doing things? No  Have you lost interest or pleasure in daily life? No  Do you often feel hopeless? No  Do you cry easily over simple problems? No  Activities of Daily Living In your present state of health, do you have any difficulty performing the following activities?:  Driving? No  Managing money?  No Feeding yourself? No Getting from bed to chair? No Climbing a flight of stairs? No Preparing food and eating?: No Bathing or showering? No Getting dressed: No Getting to the toilet? No Using the toilet:No Moving around from place to place: No In the past year have you fallen or had a near fall?:No   Are you sexually active?  Yes  Do you have more than one partner?  No  Hearing Difficulties: No Do you often ask people to speak up or repeat themselves? No Do you experience ringing or noises in your ears? No Do you have difficulty understanding soft or whispered voices? No   Do you feel that you have a problem with memory? No  Do you often misplace items? No  Do you feel safe at home?  yes Cognitive Testing  Alert? Yes  Normal Appearance?Yes  Oriented to person? Yes  Place? Yes   Time? Yes  Recall of three objects?  Yes  Can perform simple calculations? Yes  Displays appropriate judgment?Yes  Can read the correct time from a watch face?Yes   Advanced Directives have been discussed with the patient? Yes  List the Names of Other Physician/Practitioners you currently use: 1.    Indicate any recent Medical Services you may have received from other than [facility] providers in the past year (date may be approximate).  Immunization History  Administered Date(s) Administered  . DTaP  01/22/2004  . Influenza Split 11/21/2012  . Influenza,inj,Quad PF,36+ Mos 08/21/2013, 08/27/2014  . Pneumococcal Polysaccharide-23 05/09/2011  . Tdap 03/18/2014  . Zoster 11/27/2008    Screening Tests Health Maintenance  Topic Date Due  . Hepatitis C Screening  Mar 20, 1946  . DEXA SCAN  10/18/2010  . PNA vac Low Risk Adult (2 of 2 - PCV13) 05/08/2012  . INFLUENZA VACCINE  06/15/2015  . COLONOSCOPY  11/14/2016  . MAMMOGRAM  02/08/2017  . TETANUS/TDAP  03/18/2024  . ZOSTAVAX  Completed    All answers were reviewed with the patient and necessary referrals were made:  Joeleen Wortley L, MD   09/09/2015   History reviewed: allergies, current medications, past family history, past medical history, past social history, past surgical history and problem list  Review of Systems Pertinent items are noted in HPI.    Objective:     Vision by Snellen chart: right eye:20/20, left eye:20/20  Body mass index is 24.27 kg/(m^2). BP 143/66 mmHg  Pulse 71  Temp(Src) 98.1 F (36.7 C) (Oral)  Ht 5\' 1"  (1.549 m)  Wt 128 lb 6.4 oz (58.242 kg)  BMI 24.27 kg/m2  Heart - Regular rate and rhythm.  No murmurs, gallops or rubs.    Lungs:  Normal respiratory effort, chest expands symmetrically. Lungs are clear to auscultation, no crackles or wheezes.       Assessment:     Normal exam      Plan:     During the course of the visit the patient was educated and counseled about appropriate screening and preventive services including - see notes  Diet review for nutrition referral? Yes ____  Not Indicated __x__   Patient Instructions (the written plan) was given to the patient.  Medicare Attestation I have personally reviewed: The patient's medical and social history Their use of alcohol, tobacco or illicit drugs Their current medications and supplements The patient's functional ability including ADLs,fall risks, home safety risks, cognitive, and hearing and visual impairment Diet  and physical activities Evidence for depression or mood disorders  The patient's weight, height, BMI, and visual acuity have been recorded in the chart.  I have made referrals, counseling, and provided education to the patient based on review of the above and I have provided the patient with a written personalized care plan for preventive services.     Lind Covert, MD   09/09/2015         Subjective:   Hannah Landry is a 69 y.o. female who presents for Medicare Annual (Subsequent) preventive examination.  Review of Systems:  Patient reports no  vision/ hearing changes,anorexia, weight change, fever ,adenopathy, persistant / recurrent hoarseness, swallowing issues, chest pain, edema,persistant / recurrent cough, hemoptysis, dyspnea(rest, exertional, paroxysmal nocturnal), gastrointestinal  bleeding (melena, rectal bleeding), abdominal pain, excessive heart burn, GU symptoms(dysuria, hematuria, pyuria, voiding/incontinence  Issues) syncope, focal weakness, severe memory loss, concerning  skin lesions, depression, anxiety, abnormal bruising/bleeding, major joint swelling, breast masses or abnormal vaginal bleeding.    Sensation on right side of neck For several weeks - month after had rash thought to be poison ivy on that area.  No loss of sensation or weakness or pain just intermittent 'crawling' type feeling.  No skin changes now.  No where else         Objective:     Vitals: BP 143/66 mmHg  Pulse 71  Temp(Src) 98.1 F (36.7 C) (Oral)  Ht 5\' 1"  (1.549 m)  Wt 128 lb 6.4 oz (58.242 kg)  BMI 24.27 kg/m2  Neck:  No deformities, thyromegaly, masses, or tenderness noted.   Supple with full range of motion without pain. Ears:  External ear exam shows no significant lesions or deformities.  Otoscopic examination reveals clear canals, tympanic membranes are intact bilaterally without bulging, retraction, inflammation or discharge. Hearing is grossly normal bilaterall Heart - Regular  rate and rhythm.  No murmurs, gallops or rubs.    No murmur heard although in PL  Lungs:  Normal respiratory effort, chest expands symmetrically. Lungs are clear to auscultation, no crackles or wheezes. Abdomen: soft and non-tender without masses, organomegaly or hernias noted.  No guarding or rebound Extremities:  No cyanosis, edema, or deformity noted with good range of motion of all major joints.  ,  Tobacco History  Smoking status  . Never Smoker   Smokeless tobacco  . Not on file     Counseling given: No   Past Medical History  Diagnosis Date  . Anemia   . Thalassanemia    Past Surgical History  Procedure Laterality Date  . Abdominal hysterectomy    . Cesarean section     No family history on file. History  Sexual Activity  . Sexual Activity: Not on file    Outpatient Encounter Prescriptions as of 09/09/2015  Medication Sig  . Garlic 9518 MG CAPS Take by mouth.  . Calcium Carbonate-Vitamin D 600-400 MG-UNIT per tablet Take 1 tablet by mouth daily.   . folic acid (FOLVITE) 841 MCG tablet Take 400 mcg by mouth daily.    . Multiple Vitamins-Minerals (QC WOMENS DAILY MULTIVITAMIN) TABS Take 1 tablet by mouth daily.    . [DISCONTINUED] ibuprofen (ADVIL,MOTRIN) 800 MG tablet Take 1 tablet (800 mg total) by mouth every 8 (eight) hours as needed.   No facility-administered encounter medications on file as of 09/09/2015.    Activities of Daily Living No flowsheet data found.  Patient Care Team: Lind Covert, MD as PCP - General    Assessment:    Walks daily.  Has a high HDL  Exercise Activities and Dietary recommendations  Continue what you are doing     Goals    None     Fall Risk Fall Risk  09/09/2015 12/02/2013 08/21/2013  Falls in the past year? No No No   Depression Screen PHQ 2/9 Scores 09/09/2015 12/02/2013 08/21/2013  PHQ - 2 Score 0 0 0     Cognitive Testing No flowsheet data found.  Immunization History  Administered Date(s)  Administered  . DTaP 01/22/2004  . Influenza Split 11/21/2012  . Influenza,inj,Quad PF,36+ Mos 08/21/2013, 08/27/2014, 09/09/2015  . Pneumococcal Conjugate-13 09/09/2015  . Pneumococcal Polysaccharide-23 05/09/2011  . Tdap 03/18/2014  . Zoster 11/27/2008   Screening Tests Health Maintenance  Topic Date Due  . DEXA SCAN  10/18/2010  . INFLUENZA VACCINE  06/14/2016  . COLONOSCOPY  11/14/2016  . MAMMOGRAM  02/08/2017  . TETANUS/TDAP  03/18/2024  . ZOSTAVAX  Completed  . Hepatitis C Screening  Completed  . PNA vac Low Risk Adult  Completed      Plan:   See AVS During the course of the visit the patient was educated and counseled about the following appropriate screening and preventive services:   Vaccines to include Pneumoccal, Influenza, Hepatitis B, Td, Zostavax, HCV  Cardiovascular Disease  Colorectal cancer screening  Diabetes screening  Glaucoma screening  Mammography/PAP  - Sees Dr Matthew Saras for GYN exams   Nutrition counseling   Patient Instructions (the written plan) was given to the patient.   Lind Covert, MD  09/10/2015

## 2015-09-09 NOTE — Patient Instructions (Signed)
Good to see you today!  Thanks for coming in.  I will call you if your tests are not good.  Otherwise I will send you a letter.  If you do not hear from me with in 2 weeks please call our office.    Keep doing what you are doing   

## 2015-09-10 ENCOUNTER — Encounter: Payer: Self-pay | Admitting: Family Medicine

## 2015-10-28 DIAGNOSIS — Z124 Encounter for screening for malignant neoplasm of cervix: Secondary | ICD-10-CM | POA: Diagnosis not present

## 2015-10-28 DIAGNOSIS — Z779 Other contact with and (suspected) exposures hazardous to health: Secondary | ICD-10-CM | POA: Diagnosis not present

## 2015-10-28 DIAGNOSIS — Z6825 Body mass index (BMI) 25.0-25.9, adult: Secondary | ICD-10-CM | POA: Diagnosis not present

## 2015-11-12 DIAGNOSIS — L309 Dermatitis, unspecified: Secondary | ICD-10-CM | POA: Diagnosis not present

## 2015-11-12 DIAGNOSIS — B009 Herpesviral infection, unspecified: Secondary | ICD-10-CM | POA: Diagnosis not present

## 2016-02-09 DIAGNOSIS — H401131 Primary open-angle glaucoma, bilateral, mild stage: Secondary | ICD-10-CM | POA: Diagnosis not present

## 2016-02-10 DIAGNOSIS — Z1231 Encounter for screening mammogram for malignant neoplasm of breast: Secondary | ICD-10-CM | POA: Diagnosis not present

## 2016-03-01 ENCOUNTER — Encounter: Payer: Self-pay | Admitting: Family Medicine

## 2016-03-23 DIAGNOSIS — H2513 Age-related nuclear cataract, bilateral: Secondary | ICD-10-CM | POA: Diagnosis not present

## 2016-03-23 DIAGNOSIS — H401131 Primary open-angle glaucoma, bilateral, mild stage: Secondary | ICD-10-CM | POA: Diagnosis not present

## 2016-06-22 DIAGNOSIS — H2513 Age-related nuclear cataract, bilateral: Secondary | ICD-10-CM | POA: Diagnosis not present

## 2016-06-22 DIAGNOSIS — H401131 Primary open-angle glaucoma, bilateral, mild stage: Secondary | ICD-10-CM | POA: Diagnosis not present

## 2016-06-22 DIAGNOSIS — H524 Presbyopia: Secondary | ICD-10-CM | POA: Diagnosis not present

## 2016-09-14 ENCOUNTER — Encounter: Payer: Self-pay | Admitting: Family Medicine

## 2016-09-14 ENCOUNTER — Ambulatory Visit (INDEPENDENT_AMBULATORY_CARE_PROVIDER_SITE_OTHER): Payer: Medicare Other | Admitting: Family Medicine

## 2016-09-14 VITALS — BP 136/61 | HR 72 | Temp 98.3°F | Wt 129.0 lb

## 2016-09-14 DIAGNOSIS — E785 Hyperlipidemia, unspecified: Secondary | ICD-10-CM | POA: Diagnosis not present

## 2016-09-14 DIAGNOSIS — Z23 Encounter for immunization: Secondary | ICD-10-CM

## 2016-09-14 DIAGNOSIS — Z0000 Encounter for general adult medical examination without abnormal findings: Secondary | ICD-10-CM

## 2016-09-14 DIAGNOSIS — D563 Thalassemia minor: Secondary | ICD-10-CM

## 2016-09-14 DIAGNOSIS — R011 Cardiac murmur, unspecified: Secondary | ICD-10-CM | POA: Diagnosis not present

## 2016-09-14 LAB — CBC
HCT: 30.2 % — ABNORMAL LOW (ref 35.0–45.0)
HEMOGLOBIN: 9.6 g/dL — AB (ref 11.7–15.5)
MCH: 17.7 pg — ABNORMAL LOW (ref 27.0–33.0)
MCHC: 31.8 g/dL — ABNORMAL LOW (ref 32.0–36.0)
MCV: 55.8 fL — ABNORMAL LOW (ref 80.0–100.0)
Platelets: 192 10*3/uL (ref 140–400)
RBC: 5.41 MIL/uL — ABNORMAL HIGH (ref 3.80–5.10)
RDW: 19.8 % — ABNORMAL HIGH (ref 11.0–15.0)
WBC: 6.4 10*3/uL (ref 3.8–10.8)

## 2016-09-14 LAB — FERRITIN: FERRITIN: 905 ng/mL — AB (ref 20–288)

## 2016-09-14 LAB — LIPID PANEL
CHOL/HDL RATIO: 2.4 ratio (ref <=5.0)
CHOLESTEROL: 204 mg/dL — AB (ref 125–200)
HDL: 85 mg/dL (ref >=46)
LDL Cholesterol: 98 mg/dL (ref <130)
TRIGLYCERIDES: 104 mg/dL (ref <150)
VLDL: 21 mg/dL (ref <30)

## 2016-09-14 NOTE — Progress Notes (Signed)
Subjective:    Hannah Landry is a 70 y.o. female who presents for Medicare Annual/Subsequent preventive examination.  Preventive Screening-Counseling & Management  Tobacco History  Smoking Status  . Never Smoker  Smokeless Tobacco  . Not on file      Current Problems (verified) Patient Active Problem List   Diagnosis Date Noted  . Osteoporosis screening 08/27/2014  . Hyperlipidemia 05/09/2012  . 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 HEART MURMUR, BENIGN 05/05/2010    Medications Prior to Visit Current Outpatient Prescriptions on File Prior to Visit  Medication Sig Dispense Refill  . Calcium Carbonate-Vitamin D 600-400 MG-UNIT per tablet Take 1 tablet by mouth daily.     . folic acid (FOLVITE) A999333 MCG tablet Take 400 mcg by mouth daily.      . Garlic 123XX123 MG CAPS Take by mouth.    . Multiple Vitamins-Minerals (QC WOMENS DAILY MULTIVITAMIN) TABS Take 1 tablet by mouth daily.       No current facility-administered medications on file prior to visit.     Current Medications (verified) Current Outpatient Prescriptions  Medication Sig Dispense Refill  . Travoprost, BAK Free, (TRAVATAN Z) 0.004 % SOLN ophthalmic solution Place 1 drop into both eyes at bedtime.    . Calcium Carbonate-Vitamin D 600-400 MG-UNIT per tablet Take 1 tablet by mouth daily.     . folic acid (FOLVITE) A999333 MCG tablet Take 400 mcg by mouth daily.      . Garlic 123XX123 MG CAPS Take by mouth.    . Multiple Vitamins-Minerals (QC WOMENS DAILY MULTIVITAMIN) TABS Take 1 tablet by mouth daily.       No current facility-administered medications for this visit.      Allergies (verified) Review of patient's allergies indicates no known allergies.   PAST HISTORY  Family History No family history on file.  Social History Social History  Substance Use Topics  . Smoking status: Never Smoker  . Smokeless tobacco: Not on file  . Alcohol use Not on file     Are there smokers in your home (other than you)?  No  Risk Factors Current exercise habits: Home exercise routine includes walking 1 hrs per day.  Dietary issues discussed: no   Cardiac risk factors: advanced age (older than 64 for men, 25 for women).  Depression Screen (Note: if answer to either of the following is "Yes", a more complete depression screening is indicated)   Over the past two weeks, have you felt down, depressed or hopeless? No  Over the past two weeks, have you felt little interest or pleasure in doing things? No  Have you lost interest or pleasure in daily life? No  Do you often feel hopeless? No  Do you cry easily over simple problems? No  Activities of Daily Living In your present state of health, do you have any difficulty performing the following activities?:  Driving? No Managing money?  No Feeding yourself? No Getting from bed to chair? No Climbing a flight of stairs? No Preparing food and eating?: No Bathing or showering? No Getting dressed: No Getting to the toilet? No Using the toilet:No Moving around from place to place: No In the past year have you fallen or had a near fall?:No   Are you sexually active?  Yes  Do you have more than one partner?  No  Hearing Difficulties: No Do you often ask people to speak up or repeat themselves? No Do you experience ringing or noises in your ears? No Do you have  difficulty understanding soft or whispered voices? No   Do you feel that you have a problem with memory? No  Do you often misplace items? No  Do you feel safe at home? Yes  Cognitive Testing  Alert? Yes  Normal Appearance?Yes  Oriented to person? Yes  Place? Yes   Time? Yes  Recall of three objects?  Yes  Can perform simple calculations? Yes  Displays appropriate judgment?Yes  Can read the correct time from a watch face?Yes   Advanced Directives have been discussed with the patient? Yes  List the Names of Other Physician/Practitioners you currently use: 1.    Indicate any recent Medical  Services you may have received from other than [facility] providers in the past year (date may be approximate).  Immunization History  Administered Date(s) Administered  . DTaP 01/22/2004  . Influenza Split 11/21/2012  . Influenza,inj,Quad PF,36+ Mos 08/21/2013, 08/27/2014, 09/09/2015, 09/14/2016  . Pneumococcal Conjugate-13 09/09/2015  . Pneumococcal Polysaccharide-23 05/09/2011  . Tdap 03/18/2014  . Zoster 11/27/2008    Screening Tests Health Maintenance  Topic Date Due  . COLONOSCOPY  11/14/2016  . MAMMOGRAM  02/09/2018  . DTaP/Tdap/Td (3 - Td) 03/18/2024  . TETANUS/TDAP  03/18/2024  . INFLUENZA VACCINE  Completed  . DEXA SCAN  Completed  . ZOSTAVAX  Completed  . Hepatitis C Screening  Completed  . PNA vac Low Risk Adult  Completed    All answers were reviewed with the patient and necessary referrals were made:  Lind Covert, MD   09/16/2016   History reviewed: allergies, current medications, past family history, past medical history, past social history, past surgical history and problem list  Review of Systems Patient reports no  vision/ hearing changes,anorexia, weight change, fever ,adenopathy, persistant / recurrent hoarseness, swallowing issues, chest pain, edema,persistant / recurrent cough, hemoptysis, dyspnea(rest, exertional, paroxysmal nocturnal), gastrointestinal  bleeding (melena, rectal bleeding), abdominal pain, excessive heart burn, GU symptoms(dysuria, hematuria, pyuria, voiding/incontinence  Issues) syncope, focal weakness, severe memory loss, concerning skin lesions, depression, anxiety, abnormal bruising/bleeding, major joint swelling, breast masses or abnormal vaginal bleeding.      Objective:     Vision by Snellen chart: right [id number] declines measurement, left [id number] declines measurement  Body mass index is 24.37 kg/m. BP 136/61   Pulse 72   Temp 98.3 F (36.8 C) (Oral)   Wt 129 lb (58.5 kg)   SpO2 99%   BMI 24.37 kg/m   Neck:   No deformities, thyromegaly, masses, or tenderness noted.   Supple with full range of motion without pain. Heart - Regular rate and rhythm.  No murmurs, gallops or rubs.    Lungs:  Normal respiratory effort, chest expands symmetrically. Lungs are clear to auscultation, no crackles or wheezes. Abdomen: soft and non-tender without masses, organomegaly or hernias noted.  No guarding or rebound Extremities:  No cyanosis, edema, or deformity noted with good range of motion of all major joints.   Skin:  Intact without suspicious lesions or rashes Ears:  External ear exam shows no significant lesions or deformities.  Otoscopic examination reveals clear canals, tympanic membranes are intact bilaterally without bulging, retraction, inflammation or discharge. Hearing is grossly normal bilaterall      Assessment:     Healthy See problem list     Plan:     During the course of the visit the patient was educated and counseled about appropriate screening and preventive services including:   See after visit summary   Diet review for nutrition referral?  Yes ____  Not Indicated _x_   Patient Instructions (the written plan) was given to the patient.  Medicare Attestation I have personally reviewed: The patient's medical and social history Their use of alcohol, tobacco or illicit drugs Their current medications and supplements The patient's functional ability including ADLs,fall risks, home safety risks, cognitive, and hearing and visual impairment Diet and physical activities Evidence for depression or mood disorders  The patient's weight, height, BMI, and visual acuity have been recorded in the chart.  I have made referrals, counseling, and provided education to the patient based on review of the above and I have provided the patient with a written personalized care plan for preventive services.     Lind Covert, MD   09/16/2016

## 2016-09-14 NOTE — Patient Instructions (Signed)
Great to see you  I will call you if your tests are not good.  Otherwise I will send you a message.  If you do not hear from me with in 2 weeks please call our office.     Work on Consulting [employer]

## 2016-09-16 ENCOUNTER — Encounter: Payer: Self-pay | Admitting: Family Medicine

## 2016-09-16 NOTE — Assessment & Plan Note (Signed)
Check labs 

## 2016-09-16 NOTE — Assessment & Plan Note (Signed)
None heard today

## 2016-10-19 DIAGNOSIS — H401131 Primary open-angle glaucoma, bilateral, mild stage: Secondary | ICD-10-CM | POA: Diagnosis not present

## 2016-10-19 DIAGNOSIS — H524 Presbyopia: Secondary | ICD-10-CM | POA: Diagnosis not present

## 2016-10-19 DIAGNOSIS — H2513 Age-related nuclear cataract, bilateral: Secondary | ICD-10-CM | POA: Diagnosis not present

## 2016-10-21 ENCOUNTER — Other Ambulatory Visit: Payer: Self-pay

## 2017-02-13 DIAGNOSIS — Z1231 Encounter for screening mammogram for malignant neoplasm of breast: Secondary | ICD-10-CM | POA: Diagnosis not present

## 2017-03-03 DIAGNOSIS — H524 Presbyopia: Secondary | ICD-10-CM | POA: Diagnosis not present

## 2017-03-03 DIAGNOSIS — H2513 Age-related nuclear cataract, bilateral: Secondary | ICD-10-CM | POA: Diagnosis not present

## 2017-03-03 DIAGNOSIS — H401131 Primary open-angle glaucoma, bilateral, mild stage: Secondary | ICD-10-CM | POA: Diagnosis not present

## 2017-03-22 ENCOUNTER — Telehealth: Payer: Self-pay | Admitting: Family Medicine

## 2017-03-22 NOTE — Telephone Encounter (Signed)
Request pt to call back and let us know if she's had her coloscopy performed yet. - Mesha Guinyard

## 2017-03-28 ENCOUNTER — Encounter: Payer: Self-pay | Admitting: Family Medicine

## 2017-05-26 DIAGNOSIS — M8588 Other specified disorders of bone density and structure, other site: Secondary | ICD-10-CM | POA: Diagnosis not present

## 2017-05-26 DIAGNOSIS — N958 Other specified menopausal and perimenopausal disorders: Secondary | ICD-10-CM | POA: Diagnosis not present

## 2017-05-30 DIAGNOSIS — H524 Presbyopia: Secondary | ICD-10-CM | POA: Diagnosis not present

## 2017-05-30 DIAGNOSIS — H2513 Age-related nuclear cataract, bilateral: Secondary | ICD-10-CM | POA: Diagnosis not present

## 2017-05-30 DIAGNOSIS — H401131 Primary open-angle glaucoma, bilateral, mild stage: Secondary | ICD-10-CM | POA: Diagnosis not present

## 2017-06-29 ENCOUNTER — Encounter: Payer: Self-pay | Admitting: Internal Medicine

## 2017-08-17 ENCOUNTER — Ambulatory Visit (AMBULATORY_SURGERY_CENTER): Payer: Medicare Other | Admitting: *Deleted

## 2017-08-17 VITALS — Ht 61.0 in | Wt 132.2 lb

## 2017-08-17 DIAGNOSIS — Z86010 Personal history of colonic polyps: Secondary | ICD-10-CM

## 2017-08-17 NOTE — Progress Notes (Signed)
No egg or soy allergy known to patient  No issues with past sedation with any surgeries  or procedures, no intubation problems  No diet pills per patient No home 02 use per patient  No blood thinners per patient  Pt denies issues with constipation  No A fib or A flutter  EMMI video sent to pt's e mail  

## 2017-08-31 ENCOUNTER — Encounter: Payer: Medicare Other | Admitting: Internal Medicine

## 2017-09-12 ENCOUNTER — Encounter: Payer: Self-pay | Admitting: Internal Medicine

## 2017-09-12 ENCOUNTER — Ambulatory Visit (AMBULATORY_SURGERY_CENTER): Payer: Medicare Other | Admitting: Internal Medicine

## 2017-09-12 VITALS — BP 130/56 | HR 78 | Temp 98.0°F | Resp 11 | Ht 61.0 in | Wt 132.0 lb

## 2017-09-12 DIAGNOSIS — D122 Benign neoplasm of ascending colon: Secondary | ICD-10-CM

## 2017-09-12 DIAGNOSIS — Z1212 Encounter for screening for malignant neoplasm of rectum: Secondary | ICD-10-CM

## 2017-09-12 DIAGNOSIS — Z86010 Personal history of colonic polyps: Secondary | ICD-10-CM | POA: Diagnosis not present

## 2017-09-12 DIAGNOSIS — Z1211 Encounter for screening for malignant neoplasm of colon: Secondary | ICD-10-CM

## 2017-09-12 MED ORDER — SODIUM CHLORIDE 0.9 % IV SOLN: 500.0000 mL | INTRAVENOUS | Status: DC

## 2017-09-12 NOTE — Op Note (Signed)
[place] Patient Name: Hannah Landry Procedure Date: 09/12/2017 2:40 PM MRN: [id number] Endoscopist: Gatha Mayer , MD Age: 71 Referring MD:  Date of Birth: 17-Sep-1946 Gender: Female Account #: [id number] Procedure:                Colonoscopy Indications:              Screening for colorectal malignant neoplasm, Last                            colonoscopy: 2008 Medicines:                Propofol per Anesthesia, Monitored Anesthesia Care Procedure:                Pre-Anesthesia Assessment:                           - Prior to the procedure, a History and Physical                            was performed, and patient medications and                            allergies were reviewed. The patient's tolerance of                            previous anesthesia was also reviewed. The risks                            and benefits of the procedure and the sedation                            options and risks were discussed with the patient.                            All questions were answered, and informed consent                            was obtained. Prior Anticoagulants: The patient has                            taken no previous anticoagulant or antiplatelet                            agents. ASA Grade Assessment: II - A patient with                            mild systemic disease. After reviewing the risks                            and benefits, the patient was deemed in                            satisfactory condition to undergo the procedure.  After obtaining informed consent, the colonoscope                            was passed under direct vision. Throughout the                            procedure, the patient's blood pressure, pulse, and                            oxygen saturations were monitored continuously. The                            Colonoscope was introduced through the anus and                            advanced to the the  cecum, identified by                            appendiceal orifice and ileocecal valve. The                            colonoscopy was performed without difficulty. The                            patient tolerated the procedure well. The quality                            of the bowel preparation was excellent. The bowel                            preparation used was Miralax. The ileocecal valve,                            appendiceal orifice, and rectum were photographed. Scope In: 2:54:48 PM Scope Out: 3:12:34 PM Scope Withdrawal Time: 0 hours 12 minutes 54 seconds  Total Procedure Duration: 0 hours 17 minutes 46 seconds  Findings:                 The perianal and digital rectal examinations were                            normal.                           Two sessile polyps were found in the ascending                            colon. The polyps were diminutive in size. These                            polyps were removed with a cold biopsy forceps.                            Resection and retrieval were complete. Verification  of patient identification for the specimen was                            done. Estimated blood loss was minimal.                           The exam was otherwise without abnormality on                            direct and retroflexion views. Complications:            No immediate complications. Estimated Blood Loss:     Estimated blood loss was minimal. Impression:               - Two diminutive polyps in the ascending colon,                            removed with a cold biopsy forceps. Resected and                            retrieved.                           - The examination was otherwise normal on direct                            and retroflexion views. Recommendation:           - Patient has a contact number available for                            emergencies. The signs and symptoms of potential                             delayed complications were discussed with the                            patient. Return to normal activities tomorrow.                            Written discharge instructions were provided to the                            patient.                           - Resume previous diet.                           - Continue present medications.                           - Repeat colonoscopy is recommended. The                            colonoscopy date will be determined after pathology  results from today's exam become available for                            review. Gatha Mayer, MD 09/12/2017 3:18:54 PM This report has been signed electronically.

## 2017-09-12 NOTE — Patient Instructions (Addendum)
   I found and removed 2 tiny polyps. They look benign.  I will let you know pathology results and when to have another routine colonoscopy by mail and/or My Chart.  I appreciate the opportunity to care for you. Gatha Mayer, MD, FACG  YOU HAD AN ENDOSCOPIC PROCEDURE TODAY AT [facility]:   Refer to the procedure report that was given to you for any specific questions about what was found during the examination.  If the procedure report does not answer your questions, please call your gastroenterologist to clarify.  If you requested that your care partner not be given the details of your procedure findings, then the procedure report has been included in a sealed envelope for you to review at your convenience later.  YOU SHOULD EXPECT: Some feelings of bloating in the abdomen. Passage of more gas than usual.  Walking can help get rid of the air that was put into your GI tract during the procedure and reduce the bloating. If you had a lower endoscopy (such as a colonoscopy or flexible sigmoidoscopy) you may notice spotting of blood in your stool or on the toilet paper. If you underwent a bowel prep for your procedure, you may not have a normal bowel movement for a few days.  Please Note:  You might notice some irritation and congestion in your nose or some drainage.  This is from the oxygen used during your procedure.  There is no need for concern and it should clear up in a day or so.  SYMPTOMS TO REPORT IMMEDIATELY:   Following lower endoscopy (colonoscopy or flexible sigmoidoscopy):  Excessive amounts of blood in the stool  Significant tenderness or worsening of abdominal pains  Swelling of the abdomen that is new, acute  Fever of 100F or higher  For urgent or emergent issues, a gastroenterologist can be reached at any hour by calling [phone].   DIET:  We do recommend a small meal at first, but then you may proceed to your regular diet.  Drink plenty of  fluids but you should avoid alcoholic beverages for 24 hours.  ACTIVITY:  You should plan to take it easy for the rest of today and you should NOT DRIVE or use heavy machinery until tomorrow (because of the sedation medicines used during the test).    FOLLOW UP: Our staff will call the number listed on your records the next business day following your procedure to check on you and address any questions or concerns that you may have regarding the information given to you following your procedure. If we do not reach you, we will leave a message.  However, if you are feeling well and you are not experiencing any problems, there is no need to return our call.  We will assume that you have returned to your regular daily activities without incident.  If any biopsies were taken you will be contacted by phone or by letter within the next 1-3 weeks.  Please call us at [phone] if you have not heard about the biopsies in 3 weeks.    SIGNATURES/CONFIDENTIALITY: You and/or your care partner have signed paperwork which will be entered into your electronic medical record.  These signatures attest to the fact that that the information above on your After Visit Summary has been reviewed and is understood.  Full responsibility of the confidentiality of this discharge information lies with you and/or your care-partner.

## 2017-09-12 NOTE — Progress Notes (Signed)
Called to room to assist during endoscopic procedure.  Patient ID and intended procedure confirmed with present staff. Received instructions for my participation in the procedure from the performing physician.  

## 2017-09-12 NOTE — Progress Notes (Signed)
Spontaneous respirations throughout. VSS. Resting comfortably. To PACU on room air. Report to  RN. 

## 2017-09-13 ENCOUNTER — Telehealth: Payer: Self-pay | Admitting: *Deleted

## 2017-09-13 NOTE — Telephone Encounter (Signed)
  Follow up Call-  Call back number 09/12/2017  Post procedure Call Back phone  # [phone]  Permission to leave phone message Yes  Some recent data might be hidden     Patient questions:  Do you have a fever, pain , or abdominal swelling? No. Pain Score  0 *  Have you tolerated food without any problems? Yes.    Have you been able to return to your normal activities? Yes.    Do you have any questions about your discharge instructions: Diet   No. Medications  No. Follow up visit  No.  Do you have questions or concerns about your Care? No.  Actions: * If pain score is 4 or above: No action needed, pain <4.

## 2017-09-18 ENCOUNTER — Encounter: Payer: Self-pay | Admitting: Internal Medicine

## 2017-09-18 DIAGNOSIS — Z86010 Personal history of colonic polyps: Secondary | ICD-10-CM

## 2017-09-18 DIAGNOSIS — Z860101 Personal history of adenomatous and serrated colon polyps: Secondary | ICD-10-CM

## 2017-09-18 HISTORY — DX: Personal history of colonic polyps: Z86.010

## 2017-09-18 HISTORY — DX: Personal history of adenomatous and serrated colon polyps: Z86.0101

## 2017-09-18 NOTE — Progress Notes (Signed)
2 adenomas recall 2023 My Chart letter

## 2017-09-19 ENCOUNTER — Encounter: Payer: Self-pay | Admitting: Internal Medicine

## 2017-09-20 ENCOUNTER — Encounter: Payer: Self-pay | Admitting: Family Medicine

## 2017-09-20 ENCOUNTER — Ambulatory Visit (INDEPENDENT_AMBULATORY_CARE_PROVIDER_SITE_OTHER): Payer: Medicare Other | Admitting: Family Medicine

## 2017-09-20 ENCOUNTER — Other Ambulatory Visit: Payer: Self-pay

## 2017-09-20 VITALS — BP 138/62 | HR 74 | Temp 98.5°F | Ht 61.0 in | Wt 130.0 lb

## 2017-09-20 DIAGNOSIS — Z0000 Encounter for general adult medical examination without abnormal findings: Secondary | ICD-10-CM

## 2017-09-20 DIAGNOSIS — E785 Hyperlipidemia, unspecified: Secondary | ICD-10-CM

## 2017-09-20 DIAGNOSIS — Z23 Encounter for immunization: Secondary | ICD-10-CM | POA: Diagnosis not present

## 2017-09-20 DIAGNOSIS — D563 Thalassemia minor: Secondary | ICD-10-CM | POA: Diagnosis not present

## 2017-09-20 NOTE — Patient Instructions (Signed)
Good to see you  Keep doing everything you are doing  I will message you with results

## 2017-09-20 NOTE — Progress Notes (Signed)
S   Subjective:   Hannah Landry is a 71 y.o. female who presents for Medicare Annual (Subsequent) preventive examination.  Review of Systems:  Patient reports no  vision/ hearing changes,anorexia, weight change, fever ,adenopathy, persistant / recurrent hoarseness, swallowing issues, chest pain, edema,persistant / recurrent cough, hemoptysis, dyspnea(rest, exertional, paroxysmal nocturnal), gastrointestinal  bleeding (melena, rectal bleeding), abdominal pain, excessive heart burn, GU symptoms(dysuria, hematuria, pyuria, voiding/incontinence  Issues) syncope, focal weakness, severe memory loss, concerning skin lesions, depression, anxiety, abnormal bruising/bleeding, major joint swelling, breast masses or abnormal vaginal bleeding.          Objective:     Vitals: BP 138/62   Pulse 74   Temp 98.5 F (36.9 C) (Oral)   Ht 5\' 1"  (1.549 m)   Wt 130 lb (59 kg)   SpO2 98%   BMI 24.56 kg/m   Body mass index is 24.56 kg/m.  Heart - Regular rate and rhythm.  No murmurs, gallops or rubs.    Lungs:  Normal respiratory effort, chest expands symmetrically. Lungs are clear to auscultation, no crackles or wheezes. Abdomen: soft and non-tender without masses, organomegaly or hernias noted.  No guarding or rebound Ears:  External ear exam shows no significant lesions or deformities.  Otoscopic examination reveals clear canals, tympanic membranes are intact bilaterally without bulging, retraction, inflammation or discharge. Hearing is grossly normal bilaterall Neck:  No deformities, thyromegaly, masses, or tenderness noted.   Supple with full range of motion without pain. Extremities:  No cyanosis, edema, or deformity noted with good range of motion of all major joints.     Tobacco Social History   Tobacco Use  Smoking Status Never Smoker  Smokeless Tobacco Never Used     Counseling given: Not Answered   Past Medical History:  Diagnosis Date  . Anemia   . Hx of adenomatous colonic polyps  09/18/2017  . Thalassanemia    Past Surgical History:  Procedure Laterality Date  . ABDOMINAL HYSTERECTOMY    . CESAREAN SECTION    . COLONOSCOPY     Family History  Problem Relation Age of Onset  . Colon polyps Mother   . Colon cancer Neg Hx   . Esophageal cancer Neg Hx   . Rectal cancer Neg Hx   . Stomach cancer Neg Hx    Social History   Substance and Sexual Activity  Sexual Activity Not on file    Outpatient Encounter Medications as of 09/20/2017  Medication Sig  . Calcium Carbonate-Vitamin D 600-400 MG-UNIT per tablet Take 1 tablet by mouth daily.   . folic acid (FOLVITE) 546 MCG tablet Take 400 mcg by mouth daily.    . Garlic 5681 MG CAPS Take by mouth.  . Multiple Vitamins-Minerals (QC WOMENS DAILY MULTIVITAMIN) TABS Take 1 tablet by mouth daily.    . Travoprost, BAK Free, (TRAVATAN Z) 0.004 % SOLN ophthalmic solution Place 1 drop into both eyes at bedtime.   No facility-administered encounter medications on file as of 09/20/2017.     Activities of Daily Living No flowsheet data found.  Patient Care Team: Lind Covert, MD as PCP - General    Assessment:    Healthy  Exercise Activities and Dietary recommendations    Goals    None     Fall Risk Fall Risk  09/20/2017 10/21/2016 09/14/2016 09/09/2015 12/02/2013  Falls in the past year? No No No No No  Comment - Emmi Telephone Survey: data to providers prior to load - - -  Depression Screen PHQ 2/9 Scores 09/20/2017 09/14/2016 09/09/2015 12/02/2013  PHQ - 2 Score 0 0 0 0     Cognitive Function  Normal      Immunization History  Administered Date(s) Administered  . DTaP 01/22/2004  . Influenza Split 11/21/2012  . Influenza,inj,Quad PF,6+ Mos 08/21/2013, 08/27/2014, 09/09/2015, 09/14/2016, 09/20/2017  . Pneumococcal Conjugate-13 09/09/2015  . Pneumococcal Polysaccharide-23 05/09/2011  . Tdap 03/18/2014  . Zoster 11/27/2008   Screening Tests Health Maintenance  Topic Date Due  . INFLUENZA  VACCINE  06/14/2017  . MAMMOGRAM  02/09/2018  . DTaP/Tdap/Td (3 - Td) 03/18/2024  . TETANUS/TDAP  03/18/2024  . COLONOSCOPY  09/13/2027  . DEXA SCAN  Completed  . Hepatitis C Screening  Completed  . PNA vac Low Risk Adult  Completed      Plan:    See AFTER VISIT SUMMARY  Continue current activities    I have personally reviewed and noted the following in the patient's chart:   . Medical and social history . Use of alcohol, tobacco or illicit drugs  . Current medications and supplements . Functional ability and status . Nutritional status . Physical activity . Advanced directives . List of other physicians . Hospitalizations, surgeries, and ER visits in previous 12 months . Vitals . Screenings to include cognitive, depression, and falls . Referrals and appointments  In addition, I have reviewed and discussed with patient certain preventive protocols, quality metrics, and best practice recommendations. A written personalized care plan for preventive services as well as general preventive health recommendations were provided to patient.     Lind Covert, MD  09/20/2017

## 2017-09-21 ENCOUNTER — Encounter: Payer: Self-pay | Admitting: Family Medicine

## 2017-09-21 LAB — LIPID PANEL
CHOL/HDL RATIO: 2.6 ratio (ref 0.0–4.4)
CHOLESTEROL TOTAL: 213 mg/dL — AB (ref 100–199)
HDL: 83 mg/dL (ref >39)
LDL Calculated: 110 mg/dL — ABNORMAL HIGH (ref 0–99)
Triglycerides: 101 mg/dL (ref 0–149)
VLDL CHOLESTEROL CAL: 20 mg/dL (ref 5–40)

## 2017-09-21 LAB — CBC
HEMOGLOBIN: 9.1 g/dL — AB (ref 11.1–15.9)
Hematocrit: 28.9 % — ABNORMAL LOW (ref 34.0–46.6)
MCH: 17.9 pg — ABNORMAL LOW (ref 26.6–33.0)
MCHC: 31.5 g/dL (ref 31.5–35.7)
MCV: 57 fL — ABNORMAL LOW (ref 79–97)
Platelets: 259 10*3/uL (ref 150–379)
RBC: 5.07 x10E6/uL (ref 3.77–5.28)
RDW: 20.8 % — AB (ref 12.3–15.4)
WBC: 7 10*3/uL (ref 3.4–10.8)

## 2017-09-21 LAB — FERRITIN: Ferritin: 973 ng/mL — ABNORMAL HIGH (ref 15–150)

## 2017-09-26 DIAGNOSIS — H401131 Primary open-angle glaucoma, bilateral, mild stage: Secondary | ICD-10-CM | POA: Diagnosis not present

## 2017-09-26 DIAGNOSIS — H52223 Regular astigmatism, bilateral: Secondary | ICD-10-CM | POA: Diagnosis not present

## 2017-09-26 DIAGNOSIS — H524 Presbyopia: Secondary | ICD-10-CM | POA: Diagnosis not present

## 2017-09-26 DIAGNOSIS — H2513 Age-related nuclear cataract, bilateral: Secondary | ICD-10-CM | POA: Diagnosis not present

## 2017-10-16 DIAGNOSIS — Z6825 Body mass index (BMI) 25.0-25.9, adult: Secondary | ICD-10-CM | POA: Diagnosis not present

## 2017-10-16 DIAGNOSIS — Z01419 Encounter for gynecological examination (general) (routine) without abnormal findings: Secondary | ICD-10-CM | POA: Diagnosis not present

## 2018-01-24 DIAGNOSIS — H2513 Age-related nuclear cataract, bilateral: Secondary | ICD-10-CM | POA: Diagnosis not present

## 2018-01-24 DIAGNOSIS — H401131 Primary open-angle glaucoma, bilateral, mild stage: Secondary | ICD-10-CM | POA: Diagnosis not present

## 2018-02-15 DIAGNOSIS — Z1231 Encounter for screening mammogram for malignant neoplasm of breast: Secondary | ICD-10-CM | POA: Diagnosis not present

## 2018-03-08 ENCOUNTER — Encounter: Payer: Self-pay | Admitting: Family Medicine

## 2018-05-29 DIAGNOSIS — H401131 Primary open-angle glaucoma, bilateral, mild stage: Secondary | ICD-10-CM | POA: Diagnosis not present

## 2018-05-29 DIAGNOSIS — H52223 Regular astigmatism, bilateral: Secondary | ICD-10-CM | POA: Diagnosis not present

## 2018-05-29 DIAGNOSIS — H2513 Age-related nuclear cataract, bilateral: Secondary | ICD-10-CM | POA: Diagnosis not present

## 2018-05-29 DIAGNOSIS — H524 Presbyopia: Secondary | ICD-10-CM | POA: Diagnosis not present

## 2018-06-06 DIAGNOSIS — H401131 Primary open-angle glaucoma, bilateral, mild stage: Secondary | ICD-10-CM | POA: Diagnosis not present

## 2018-06-06 DIAGNOSIS — H2513 Age-related nuclear cataract, bilateral: Secondary | ICD-10-CM | POA: Diagnosis not present

## 2018-07-10 DIAGNOSIS — H401131 Primary open-angle glaucoma, bilateral, mild stage: Secondary | ICD-10-CM | POA: Diagnosis not present

## 2018-07-10 DIAGNOSIS — H2513 Age-related nuclear cataract, bilateral: Secondary | ICD-10-CM | POA: Diagnosis not present

## 2018-08-22 ENCOUNTER — Telehealth: Payer: Self-pay | Admitting: Family Medicine

## 2018-09-25 ENCOUNTER — Other Ambulatory Visit: Payer: Self-pay

## 2018-09-25 ENCOUNTER — Ambulatory Visit (INDEPENDENT_AMBULATORY_CARE_PROVIDER_SITE_OTHER): Payer: Medicare Other | Admitting: Family Medicine

## 2018-09-25 ENCOUNTER — Encounter: Payer: Self-pay | Admitting: Family Medicine

## 2018-09-25 VITALS — BP 116/70 | HR 72 | Temp 98.5°F | Ht 61.0 in | Wt 129.0 lb

## 2018-09-25 DIAGNOSIS — Z0000 Encounter for general adult medical examination without abnormal findings: Secondary | ICD-10-CM | POA: Diagnosis not present

## 2018-09-25 DIAGNOSIS — D563 Thalassemia minor: Secondary | ICD-10-CM

## 2018-09-25 DIAGNOSIS — Z23 Encounter for immunization: Secondary | ICD-10-CM | POA: Diagnosis not present

## 2018-09-25 DIAGNOSIS — E785 Hyperlipidemia, unspecified: Secondary | ICD-10-CM | POA: Diagnosis not present

## 2018-09-25 MED ORDER — ZOSTER VAC RECOMB ADJUVANTED 50 MCG/0.5ML IM SUSR: INTRAMUSCULAR | 1 refills | Status: DC

## 2018-09-25 NOTE — Patient Instructions (Addendum)
Good to see you today!  Thanks for coming in.  Consider getting the Shingrix when it works with your schedule  I will call you if your tests are not good.  Otherwise I will send you a letter.  If you do not hear from me with in 2 weeks please call our office.     Keep doing what you are doing  Be well

## 2018-09-25 NOTE — Progress Notes (Signed)
s   Subjective:   Hannah Landry is a 72 y.o. female who presents for Medicare Annual (Subsequent) preventive examination.  Review of Systems:  Patient reports no  vision/ hearing changes,anorexia, weight change, fever ,adenopathy, persistant / recurrent hoarseness, swallowing issues, chest pain, edema,persistant / recurrent cough, hemoptysis, dyspnea(rest, exertional, paroxysmal nocturnal), gastrointestinal  bleeding (melena, rectal bleeding), abdominal pain, excessive heart burn, GU symptoms(dysuria, hematuria, pyuria, voiding/incontinence  Issues) syncope, focal weakness, severe memory loss, concerning skin lesions, depression, anxiety, abnormal bruising/bleeding, major joint swelling, breast masses or abnormal vaginal bleeding.          Objective:     Vitals: BP 116/70   Pulse 72   Temp 98.5 F (36.9 C) (Oral)   Ht 5\' 1"  (1.549 m)   Wt 129 lb (58.5 kg)   SpO2 99%   BMI 24.37 kg/m   Body mass index is 24.37 kg/m.  Advanced Directives 09/25/2018 09/20/2017 09/12/2017 08/17/2017 09/14/2016 09/09/2015  Does Patient Have a Medical Advance Directive? No Yes Yes Yes Yes Yes  Type of Advance Directive - Living will - [place];Living will Living will [place];Living will  Does patient want to make changes to medical advance directive? - Yes (Inpatient - patient defers changing a medical advance directive at this time) - - - -  Copy of [place] in Chart? - - - - No - copy requested No - copy requested  Would patient like information on creating a medical advance directive? No - Patient declined - - - - -    Tobacco Social History   Tobacco Use  Smoking Status Never Smoker  Smokeless Tobacco Never Used     Counseling given: Not Answered   Clinical Intake:  Pre-visit preparation completed: Yes  Pain : No/denies pain Pain Score: 0-No pain     Diabetes: No  How often do you need to have someone help you when you read  instructions, pamphlets, or other written materials from your doctor or pharmacy?: 1 - Never  Interpreter Needed?: No     Past Medical History:  Diagnosis Date  . Anemia   . Hx of adenomatous colonic polyps 09/18/2017  . Thalassanemia    Past Surgical History:  Procedure Laterality Date  . ABDOMINAL HYSTERECTOMY    . CESAREAN SECTION    . COLONOSCOPY     Family History  Problem Relation Age of Onset  . Colon polyps Mother   . Colon cancer Neg Hx   . Esophageal cancer Neg Hx   . Rectal cancer Neg Hx   . Stomach cancer Neg Hx    Social History   Socioeconomic History  . Marital status: Married    Spouse name: Not on file  . Number of children: Not on file  . Years of education: Not on file  . Highest education level: Not on file  Occupational History  . Not on file  Social Needs  . Financial resource strain: Not on file  . Food insecurity:    Worry: Not on file    Inability: Not on file  . Transportation needs:    Medical: Not on file    Non-medical: Not on file  Tobacco Use  . Smoking status: Never Smoker  . Smokeless tobacco: Never Used  Substance and Sexual Activity  . Alcohol use: No  . Drug use: No  . Sexual activity: Not on file  Lifestyle  . Physical activity:    Days per week: Not on  file    Minutes per session: Not on file  . Stress: Not on file  Relationships  . Social connections:    Talks on phone: Not on file    Gets together: Not on file    Attends religious service: Not on file    Active member of club or organization: Not on file    Attends meetings of clubs or organizations: Not on file    Relationship status: Not on file  Other Topics Concern  . Not on file  Social History Narrative  . Not on file    Outpatient Encounter Medications as of 09/25/2018  Medication Sig  . Calcium Carbonate-Vitamin D 600-400 MG-UNIT per tablet Take 1 tablet by mouth daily.   . folic acid (FOLVITE) 253 MCG tablet Take 400 mcg by mouth daily.    .  Garlic 6644 MG CAPS Take by mouth.  . Multiple Vitamins-Minerals (QC WOMENS DAILY MULTIVITAMIN) TABS Take 1 tablet by mouth daily.    . Tafluprost, PF, (ZIOPTAN) 0.0015 % SOLN Apply to eye at bedtime.  Marland Kitchen Zoster Vaccine Adjuvanted [facility]) injection Inject 0.5 ml IM and Repeat in 2 months  . [DISCONTINUED] Travoprost, BAK Free, (TRAVATAN Z) 0.004 % SOLN ophthalmic solution Place 1 drop into both eyes at bedtime.   No facility-administered encounter medications on file as of 09/25/2018.     Activities of Daily Living No flowsheet data found.  Patient Care Team: Lind Covert, MD as PCP - General    Assessment:   This is a routine wellness examination for Hannah Landry.  Exercise Activities and Dietary recommendations    Goals   None     Fall Risk Fall Risk  09/20/2017 10/21/2016 09/14/2016 09/09/2015 12/02/2013  Falls in the past year? No No No No No  Comment - Emmi Telephone Survey: data to providers prior to load - - -   Is the patient's home free of loose throw rugs in walkways, pet beds, electrical cords, etc?   yes      Grab bars in the bathroom? no      Handrails on the stairs?   no      Adequate lighting?   yes  Timed Get Up and Go performed: Yes - Normal   Depression Screen PHQ 2/9 Scores 09/25/2018 09/20/2017 09/14/2016 09/09/2015  PHQ - 2 Score 0 0 0 0     Cognitive Function        Immunization History  Administered Date(s) Administered  . DTaP 01/22/2004  . Influenza Split 11/21/2012  . Influenza,inj,Quad PF,6+ Mos 08/21/2013, 08/27/2014, 09/09/2015, 09/14/2016, 09/20/2017, 09/25/2018  . Pneumococcal Conjugate-13 09/09/2015  . Pneumococcal Polysaccharide-23 05/09/2011  . Tdap 03/18/2014  . Zoster 11/27/2008    Qualifies for Shingles Vaccine?Yes sent in eRx   Screening Tests Health Maintenance  Topic Date Due  . MAMMOGRAM  02/16/2020  . COLONOSCOPY  09/12/2022  . DTaP/Tdap/Td (3 - Td) 03/18/2024  . TETANUS/TDAP  03/18/2024  . INFLUENZA VACCINE   Completed  . DEXA SCAN  Completed  . Hepatitis C Screening  Completed  . PNA vac Low Risk Adult  Completed    Cancer Screenings: Lung: Low Dose CT Chest recommended if Age 66-80 years, 30 pack-year currently smoking OR have quit w/in 15years. Patient does not qualify. Breast:  Up to date on Mammogram? Yes   Up to date of Bone Density/Dexa? Yes Colorectal: UTD  Additional Screenings: current: Hepatitis C Screening:   EXAM Heart - Regular rate and rhythm.  No murmurs, gallops  or rubs.    Lungs:  Normal respiratory effort, chest expands symmetrically. Lungs are clear to auscultation, no crackles or wheezes.Marland Kitchenabd Extremities:  No cyanosis, edema, or deformity noted with good range of motion of all major joints.   Skin:  Intact without suspicious lesions or rashes Neck:  No deformities, thyromegaly, masses, or tenderness noted.   Supple with full range of motion without pain. Ears:  External ear exam shows no significant lesions or deformities.  Otoscopic examination reveals clear canals, tympanic membranes are intact bilaterally without bulging, retraction, inflammation or discharge. Hearing is grossly normal bilaterall Extremities:  No cyanosis, edema, or deformity noted with good range of motion of all major joints.        Plan:    Healthy with normal exam   See Problem List   I have personally reviewed and noted the following in the patient's chart:   . Medical and social history . Use of alcohol, tobacco or illicit drugs  . Current medications and supplements . Functional ability and status . Nutritional status . Physical activity . Advanced directives . List of other physicians . Hospitalizations, surgeries, and ER visits in previous 12 months . Vitals . Screenings to include cognitive, depression, and falls . Referrals and appointments  In addition, I have reviewed and discussed with patient certain preventive protocols, quality metrics, and best practice  recommendations. A written personalized care plan for preventive services as well as general preventive health recommendations were provided to patient.     Lind Covert, MD  09/26/2018

## 2018-09-26 ENCOUNTER — Encounter: Payer: Self-pay | Admitting: Family Medicine

## 2018-09-26 LAB — CBC
HEMATOCRIT: 28.5 % — AB (ref 34.0–46.6)
Hemoglobin: 8.8 g/dL — ABNORMAL LOW (ref 11.1–15.9)
MCH: 17.5 pg — ABNORMAL LOW (ref 26.6–33.0)
MCHC: 30.9 g/dL — AB (ref 31.5–35.7)
MCV: 57 fL — ABNORMAL LOW (ref 79–97)
Platelets: 340 10*3/uL (ref 150–450)
RBC: 5.04 x10E6/uL (ref 3.77–5.28)
RDW: 21.1 % — AB (ref 12.3–15.4)
WBC: 6.8 10*3/uL (ref 3.4–10.8)

## 2018-09-26 LAB — LIPID PANEL
Chol/HDL Ratio: 2.7 ratio (ref 0.0–4.4)
Cholesterol, Total: 214 mg/dL — ABNORMAL HIGH (ref 100–199)
HDL: 78 mg/dL (ref >39)
LDL CALC: 117 mg/dL — AB (ref 0–99)
TRIGLYCERIDES: 97 mg/dL (ref 0–149)
VLDL Cholesterol Cal: 19 mg/dL (ref 5–40)

## 2018-09-26 LAB — FERRITIN: FERRITIN: 868 ng/mL — AB (ref 15–150)

## 2018-09-26 NOTE — Assessment & Plan Note (Signed)
Will check labs

## 2018-09-26 NOTE — Assessment & Plan Note (Signed)
Check labs 

## 2018-10-03 ENCOUNTER — Encounter: Payer: Self-pay | Admitting: Family Medicine

## 2018-10-03 MED ORDER — VALACYCLOVIR HCL 1 G PO TABS: ORAL_TABLET | ORAL | 1 refills | Status: DC

## 2018-10-03 MED ORDER — FLUTICASONE PROPIONATE 0.05 % EX CREA: TOPICAL_CREAM | Freq: Two times a day (BID) | CUTANEOUS | 0 refills | Status: DC

## 2018-10-04 ENCOUNTER — Other Ambulatory Visit: Payer: Self-pay

## 2018-10-16 DIAGNOSIS — H2513 Age-related nuclear cataract, bilateral: Secondary | ICD-10-CM | POA: Diagnosis not present

## 2018-10-16 DIAGNOSIS — H401131 Primary open-angle glaucoma, bilateral, mild stage: Secondary | ICD-10-CM | POA: Diagnosis not present

## 2018-10-22 DIAGNOSIS — Z01419 Encounter for gynecological examination (general) (routine) without abnormal findings: Secondary | ICD-10-CM | POA: Diagnosis not present

## 2018-10-22 DIAGNOSIS — Z6825 Body mass index (BMI) 25.0-25.9, adult: Secondary | ICD-10-CM | POA: Diagnosis not present

## 2018-10-24 NOTE — Telephone Encounter (Signed)
Spoke with pt

## 2018-11-27 DIAGNOSIS — H2513 Age-related nuclear cataract, bilateral: Secondary | ICD-10-CM | POA: Diagnosis not present

## 2018-11-27 DIAGNOSIS — H401131 Primary open-angle glaucoma, bilateral, mild stage: Secondary | ICD-10-CM | POA: Diagnosis not present

## 2019-06-03 DIAGNOSIS — H43812 Vitreous degeneration, left eye: Secondary | ICD-10-CM | POA: Diagnosis not present

## 2019-06-11 DIAGNOSIS — Z1231 Encounter for screening mammogram for malignant neoplasm of breast: Secondary | ICD-10-CM | POA: Diagnosis not present

## 2019-06-17 ENCOUNTER — Encounter: Payer: Self-pay | Admitting: Family Medicine

## 2019-08-06 DIAGNOSIS — H11151 Pinguecula, right eye: Secondary | ICD-10-CM | POA: Diagnosis not present

## 2019-08-06 DIAGNOSIS — H401131 Primary open-angle glaucoma, bilateral, mild stage: Secondary | ICD-10-CM | POA: Diagnosis not present

## 2019-08-06 DIAGNOSIS — H2513 Age-related nuclear cataract, bilateral: Secondary | ICD-10-CM | POA: Diagnosis not present

## 2019-08-06 DIAGNOSIS — H43812 Vitreous degeneration, left eye: Secondary | ICD-10-CM | POA: Diagnosis not present

## 2019-09-10 ENCOUNTER — Ambulatory Visit: Payer: Medicare Other

## 2019-10-02 ENCOUNTER — Other Ambulatory Visit: Payer: Self-pay

## 2019-10-02 ENCOUNTER — Ambulatory Visit (INDEPENDENT_AMBULATORY_CARE_PROVIDER_SITE_OTHER): Payer: Medicare Other | Admitting: Family Medicine

## 2019-10-02 ENCOUNTER — Encounter: Payer: Self-pay | Admitting: Family Medicine

## 2019-10-02 DIAGNOSIS — D563 Thalassemia minor: Secondary | ICD-10-CM

## 2019-10-02 DIAGNOSIS — E785 Hyperlipidemia, unspecified: Secondary | ICD-10-CM

## 2019-10-02 DIAGNOSIS — Z23 Encounter for immunization: Secondary | ICD-10-CM

## 2019-10-02 MED ORDER — VALACYCLOVIR HCL 1 G PO TABS: ORAL_TABLET | ORAL | 2 refills | Status: DC

## 2019-10-02 MED ORDER — FLUTICASONE PROPIONATE 0.05 % EX CREA: TOPICAL_CREAM | Freq: Two times a day (BID) | CUTANEOUS | 2 refills | Status: DC

## 2019-10-02 NOTE — Patient Instructions (Signed)
Great to see you  Keep doing what you are doing!  I will send lab results via My Chart

## 2019-10-02 NOTE — Assessment & Plan Note (Signed)
Check labs 

## 2019-10-02 NOTE — Assessment & Plan Note (Signed)
Will check labs

## 2019-10-02 NOTE — Progress Notes (Signed)
Subjective:   Hannah Landry is a 73 y.o. female who presents for Medicare Annual (Subsequent) preventive examination. She has not complaints   Review of Systems:  Patient reports no  vision/ hearing changes,anorexia, weight change, fever ,adenopathy, persistant / recurrent hoarseness, swallowing issues, chest pain, edema,persistant / recurrent cough, hemoptysis, dyspnea(rest, exertional, paroxysmal nocturnal), gastrointestinal  bleeding (melena, rectal bleeding), abdominal pain, excessive heart burn, GU symptoms(dysuria, hematuria, pyuria, voiding/incontinence  Issues) syncope, focal weakness, severe memory loss, concerning skin lesions, depression, anxiety, abnormal bruising/bleeding, major joint swelling, breast masses or abnormal vaginal bleeding.       Objective:     Vitals: BP 140/62   Pulse 69   Wt 131 lb 9.6 oz (59.7 kg)   SpO2 99%   BMI 24.87 kg/m   Body mass index is 24.87 kg/m.  Advanced Directives 10/02/2019 09/25/2018 09/20/2017 09/12/2017 08/17/2017 09/14/2016 09/09/2015  Does Patient Have a Medical Advance Directive? No No Yes Yes Yes Yes Yes  Type of Advance Directive - - Living will - [place];Living will Living will [place];Living will  Does patient want to make changes to medical advance directive? - - Yes (Inpatient - patient defers changing a medical advance directive at this time) - - - -  Copy of [place] in Chart? - - - - - No - copy requested No - copy requested  Would patient like information on creating a medical advance directive? No - Patient declined No - Patient declined - - - - -    Tobacco Social History   Tobacco Use  Smoking Status Never Smoker  Smokeless Tobacco Never Used     Counseling given: Not Answered   Clinical Intake:  Pre-visit preparation completed: Yes   How often do you need to have someone help you when you read instructions, pamphlets, or other written materials  from your doctor or pharmacy?: 1 - Never  Interpreter Needed?: No     Past Medical History:  Diagnosis Date  . Anemia   . Hx of adenomatous colonic polyps 09/18/2017  . Thalassanemia    Past Surgical History:  Procedure Laterality Date  . ABDOMINAL HYSTERECTOMY    . CESAREAN SECTION    . COLONOSCOPY     Family History  Problem Relation Age of Onset  . Colon polyps Mother   . Colon cancer Neg Hx   . Esophageal cancer Neg Hx   . Rectal cancer Neg Hx   . Stomach cancer Neg Hx    Social History   Socioeconomic History  . Marital status: Married    Spouse name: Not on file  . Number of children: Not on file  . Years of education: Not on file  . Highest education level: Not on file  Occupational History  . Not on file  Social Needs  . Financial resource strain: Not on file  . Food insecurity    Worry: Not on file    Inability: Not on file  . Transportation needs    Medical: Not on file    Non-medical: Not on file  Tobacco Use  . Smoking status: Never Smoker  . Smokeless tobacco: Never Used  Substance and Sexual Activity  . Alcohol use: No  . Drug use: No  . Sexual activity: Not on file  Lifestyle  . Physical activity    Days per week: Not on file    Minutes per session: Not on file  . Stress: Not on file  Relationships  .  Social Herbalist on phone: Not on file    Gets together: Not on file    Attends religious service: Not on file    Active member of club or organization: Not on file    Attends meetings of clubs or organizations: Not on file    Relationship status: Not on file  Other Topics Concern  . Not on file  Social History Narrative  . Not on file    Outpatient Encounter Medications as of 10/02/2019  Medication Sig  . Calcium Carbonate-Vitamin D 600-400 MG-UNIT per tablet Take 1 tablet by mouth daily.   . fluticasone (CUTIVATE) 0.05 % cream Apply topically 2 (two) times daily.  . folic acid (FOLVITE) A999333 MCG tablet Take 400 mcg by  mouth daily.    . Garlic 123XX123 MG CAPS Take by mouth.  . Multiple Vitamins-Minerals (QC WOMENS DAILY MULTIVITAMIN) TABS Take 1 tablet by mouth daily.    . Tafluprost, PF, (ZIOPTAN) 0.0015 % SOLN Apply to eye at bedtime.  . valACYclovir (VALTREX) 1000 MG tablet Take 2 tabs now and repeat in 12 hours  . [DISCONTINUED] fluticasone (CUTIVATE) 0.05 % cream Apply topically 2 (two) times daily.  . [DISCONTINUED] valACYclovir (VALTREX) 1000 MG tablet Take 2 tabs now and repeat in 12 hours  . [DISCONTINUED] Zoster Vaccine Adjuvanted [facility]) injection Inject 0.5 ml IM and Repeat in 2 months   No facility-administered encounter medications on file as of 10/02/2019.     Activities of Daily Living Completes all without problems Patient Care Team: Lind Covert, MD as PCP - General    PE Psych:  Cognition and judgment appear intact. Alert, communicative  and cooperative with normal attention span and concentration. No apparent delusions, illusions, hallucinations Heart - Regular rate and rhythm.  No murmurs, gallops or rubs.    Lungs:  Normal respiratory effort, chest expands symmetrically. Lungs are clear to auscultation, no crackles or wheezes. Neck:  No deformities, thyromegaly, masses, or tenderness noted.   Supple with full range of motion without pain. Abdomen: soft and non-tender without masses, organomegaly or hernias noted.  No guarding or rebound Skin:  Intact without suspicious lesions or rashes Extremities:  No cyanosis, edema, or deformity noted with good range of motion of all major joints.      Assessment:   This is a routine wellness examination for Hannah Landry.  Exercise Activities and Dietary recommendations   Continue current diet and exercise   Goals   None     Fall Risk Fall Risk  10/02/2019 10/04/2018 09/20/2017 10/21/2016 09/14/2016  Falls in the past year? 0 0 No No No  Comment - Emmi Telephone Survey: data to providers prior to load - Emmi Telephone Survey: data  to providers prior to load -  Number falls in past yr: 0 - - - -   Is the patient's home free of loose throw rugs in walkways, pet beds, electrical cords, etc?   yes      Grab bars in the bathroom? no      Handrails on the stairs?   yes      Adequate lighting?   yes  Patient rating of health (0-10) scale: Excellent   Depression Screen PHQ 2/9 Scores 10/02/2019 09/25/2018 09/20/2017 09/14/2016  PHQ - 2 Score 0 0 0 0     Cognitive Function  Normal recall, thought processes      Immunization History  Administered Date(s) Administered  . DTaP 01/22/2004  . Fluad Quad(high Dose 65+)  10/02/2019  . Influenza Split 11/21/2012  . Influenza,inj,Quad PF,6+ Mos 08/21/2013, 08/27/2014, 09/09/2015, 09/14/2016, 09/20/2017, 09/25/2018  . Pneumococcal Conjugate-13 09/09/2015  . Pneumococcal Polysaccharide-23 05/09/2011  . Tdap 03/18/2014  . Zoster 11/27/2008  . Zoster Recombinat (Shingrix) 11/27/2018     Screening Tests Health Maintenance  Topic Date Due  . INFLUENZA VACCINE  06/15/2019  . MAMMOGRAM  06/10/2021  . COLONOSCOPY  09/12/2022  . DTaP/Tdap/Td (3 - Td) 03/18/2024  . TETANUS/TDAP  03/18/2024  . DEXA SCAN  Completed  . Hepatitis C Screening  Completed  . PNA vac Low Risk Adult  Completed    Cancer Screenings: Lung: Low Dose CT Chest recommended if Age 8-80 years, 30 pack-year currently smoking OR have quit w/in 15years. Patient does not qualify. Breast:  Up to date on Mammogram? Yes   Up to date of Bone Density/Dexa? Yes Colorectal: current  Additional Screenings: done: Hepatitis C Screening:      Plan:  Healthy  I have personally reviewed and noted the following in the patient's chart:   . Medical and social history . Use of alcohol, tobacco or illicit drugs  . Current medications and supplements . Functional ability and status . Nutritional status . Physical activity . Advanced directives . List of other physicians . Hospitalizations, surgeries, and ER  visits in previous 12 months . Vitals . Screenings to include cognitive, depression, and falls . Referrals and appointments  In addition, I have reviewed and discussed with patient certain preventive protocols, quality metrics, and best practice recommendations. A written personalized care plan for preventive services as well as general preventive health recommendations were provided to patient.    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

## 2019-10-03 ENCOUNTER — Encounter: Payer: Self-pay | Admitting: Family Medicine

## 2019-10-03 DIAGNOSIS — D563 Thalassemia minor: Secondary | ICD-10-CM

## 2019-10-03 LAB — CBC
Hematocrit: 30.5 % — ABNORMAL LOW (ref 34.0–46.6)
Hemoglobin: 9.2 g/dL — ABNORMAL LOW (ref 11.1–15.9)
MCH: 17.5 pg — ABNORMAL LOW (ref 26.6–33.0)
MCHC: 30.2 g/dL — ABNORMAL LOW (ref 31.5–35.7)
MCV: 58 fL — ABNORMAL LOW (ref 79–97)
Platelets: 190 10*3/uL (ref 150–450)
RBC: 5.26 x10E6/uL (ref 3.77–5.28)
RDW: 21.2 % — ABNORMAL HIGH (ref 11.7–15.4)
WBC: 7.1 10*3/uL (ref 3.4–10.8)

## 2019-10-03 LAB — FERRITIN: Ferritin: 1051 ng/mL — ABNORMAL HIGH (ref 15–150)

## 2019-10-03 LAB — LIPID PANEL
Chol/HDL Ratio: 2.5 ratio (ref 0.0–4.4)
Cholesterol, Total: 196 mg/dL (ref 100–199)
HDL: 79 mg/dL (ref >39)
LDL Chol Calc (NIH): 99 mg/dL (ref 0–99)
Triglycerides: 100 mg/dL (ref 0–149)
VLDL Cholesterol Cal: 18 mg/dL (ref 5–40)

## 2019-10-03 NOTE — Telephone Encounter (Signed)
Discussed via phone  Decided to repeat labs in  75mo

## 2019-10-09 ENCOUNTER — Other Ambulatory Visit: Payer: Self-pay

## 2020-02-12 ENCOUNTER — Encounter: Payer: Self-pay | Admitting: Family Medicine

## 2020-03-18 ENCOUNTER — Encounter: Payer: Self-pay | Admitting: Family Medicine

## 2020-04-09 ENCOUNTER — Other Ambulatory Visit: Payer: Private Health Insurance - Indemnity

## 2020-04-09 ENCOUNTER — Other Ambulatory Visit: Payer: Self-pay

## 2020-04-09 DIAGNOSIS — D563 Thalassemia minor: Secondary | ICD-10-CM

## 2020-04-10 ENCOUNTER — Encounter: Payer: Self-pay | Admitting: Family Medicine

## 2020-04-10 ENCOUNTER — Telehealth: Payer: Self-pay | Admitting: Family Medicine

## 2020-04-10 LAB — CBC
Hematocrit: 29.8 % — ABNORMAL LOW (ref 34.0–46.6)
Hemoglobin: 9 g/dL — ABNORMAL LOW (ref 11.1–15.9)
MCH: 17.7 pg — ABNORMAL LOW (ref 26.6–33.0)
MCHC: 30.2 g/dL — ABNORMAL LOW (ref 31.5–35.7)
MCV: 59 fL — ABNORMAL LOW (ref 79–97)
NRBC: 1 % — ABNORMAL HIGH (ref 0–0)
Platelets: 163 10*3/uL (ref 150–450)
RBC: 5.08 x10E6/uL (ref 3.77–5.28)
RDW: 20.6 % — ABNORMAL HIGH (ref 11.7–15.4)
WBC: 6.2 10*3/uL (ref 3.4–10.8)

## 2020-04-10 LAB — FERRITIN: Ferritin: 1197 ng/mL — ABNORMAL HIGH (ref 15–150)

## 2020-04-14 ENCOUNTER — Encounter: Payer: Self-pay | Admitting: Family Medicine

## 2020-04-16 ENCOUNTER — Telehealth: Payer: Self-pay | Admitting: Family Medicine

## 2020-04-16 NOTE — Telephone Encounter (Signed)
Spoke with her  Reiterated what said in my chart message  She will make appointment with hematology.  Will let me know if needs referral

## 2020-04-16 NOTE — Telephone Encounter (Signed)
See other phone note

## 2020-04-22 ENCOUNTER — Encounter: Payer: Self-pay | Admitting: Family Medicine

## 2020-04-22 DIAGNOSIS — D563 Thalassemia minor: Secondary | ICD-10-CM

## 2020-05-01 ENCOUNTER — Telehealth: Payer: Self-pay | Admitting: Physician Assistant

## 2020-05-01 NOTE — Telephone Encounter (Signed)
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 Hannah Landry has been cld and scheduled to see Cassie on 6/29 at 130pm w/labs at 1pm. Pt aware to arrive 15 minutes early.

## 2020-05-12 ENCOUNTER — Inpatient Hospital Stay: Payer: Medicare HMO

## 2020-05-12 ENCOUNTER — Inpatient Hospital Stay: Payer: Medicare HMO | Attending: Physician Assistant | Admitting: Physician Assistant

## 2020-05-12 ENCOUNTER — Encounter: Payer: Self-pay | Admitting: Physician Assistant

## 2020-05-12 ENCOUNTER — Other Ambulatory Visit: Payer: Self-pay | Admitting: Physician Assistant

## 2020-05-12 ENCOUNTER — Other Ambulatory Visit: Payer: Self-pay

## 2020-05-12 VITALS — BP 152/74 | HR 69 | Temp 98.1°F | Resp 18 | Ht 61.0 in | Wt 133.4 lb

## 2020-05-12 DIAGNOSIS — D563 Thalassemia minor: Secondary | ICD-10-CM

## 2020-05-12 LAB — CBC WITH DIFFERENTIAL (CANCER CENTER ONLY)
Abs Immature Granulocytes: 0.01 10*3/uL (ref 0.00–0.07)
Basophils Absolute: 0 10*3/uL (ref 0.0–0.1)
Basophils Relative: 1 %
Eosinophils Absolute: 0.1 10*3/uL (ref 0.0–0.5)
Eosinophils Relative: 1 %
HCT: 30.2 % — ABNORMAL LOW (ref 36.0–46.0)
Hemoglobin: 9.3 g/dL — ABNORMAL LOW (ref 12.0–15.0)
Immature Granulocytes: 0 %
Lymphocytes Relative: 24 %
Lymphs Abs: 1.5 10*3/uL (ref 0.7–4.0)
MCH: 17.8 pg — ABNORMAL LOW (ref 26.0–34.0)
MCHC: 30.8 g/dL (ref 30.0–36.0)
MCV: 57.9 fL — ABNORMAL LOW (ref 80.0–100.0)
Monocytes Absolute: 0.4 10*3/uL (ref 0.1–1.0)
Monocytes Relative: 7 %
Neutro Abs: 4.2 10*3/uL (ref 1.7–7.7)
Neutrophils Relative %: 67 %
Platelet Count: 223 10*3/uL (ref 150–400)
RBC: 5.22 MIL/uL — ABNORMAL HIGH (ref 3.87–5.11)
RDW: 19.4 % — ABNORMAL HIGH (ref 11.5–15.5)
WBC Count: 6.4 10*3/uL (ref 4.0–10.5)
nRBC: 0.8 % — ABNORMAL HIGH (ref 0.0–0.2)

## 2020-05-12 LAB — CMP (CANCER CENTER ONLY)
ALT: 15 U/L (ref 0–44)
AST: 17 U/L (ref 15–41)
Albumin: 4.6 g/dL (ref 3.5–5.0)
Alkaline Phosphatase: 83 U/L (ref 38–126)
Anion gap: 7 (ref 5–15)
BUN: 13 mg/dL (ref 8–23)
CO2: 31 mmol/L (ref 22–32)
Calcium: 10.1 mg/dL (ref 8.9–10.3)
Chloride: 104 mmol/L (ref 98–111)
Creatinine: 0.95 mg/dL (ref 0.44–1.00)
GFR, Est AFR Am: >60 mL/min (ref >60)
GFR, Estimated: 59 mL/min — ABNORMAL LOW (ref >60)
Glucose, Bld: 95 mg/dL (ref 70–99)
Potassium: 4.6 mmol/L (ref 3.5–5.1)
Sodium: 142 mmol/L (ref 135–145)
Total Bilirubin: 1.4 mg/dL — ABNORMAL HIGH (ref 0.3–1.2)
Total Protein: 7.1 g/dL (ref 6.5–8.1)

## 2020-05-12 LAB — IRON AND TIBC
Iron: 63 ug/dL (ref 41–142)
Saturation Ratios: 23 % (ref 21–57)
TIBC: 273 ug/dL (ref 236–444)
UIBC: 210 ug/dL (ref 120–384)

## 2020-05-12 LAB — VITAMIN B12: Vitamin B-12: 504 pg/mL (ref 180–914)

## 2020-05-12 LAB — LACTATE DEHYDROGENASE: LDH: 162 U/L (ref 98–192)

## 2020-05-12 LAB — FOLATE: Folate: >100 ng/mL (ref >5.9)

## 2020-05-12 LAB — FERRITIN: Ferritin: 719 ng/mL — ABNORMAL HIGH (ref 11–307)

## 2020-05-12 NOTE — Progress Notes (Signed)
[facility] [phone]   [phone]  CONSULT NOTE  REFERRING PHYSICIAN: Dr. Erin Hearing  REASON FOR CONSULTATION:  Thalassemia minor  HPI Hannah Landry is a 74 y.o. female with a past medical history significant for heart murmur, borderline hyperlipidemia, and hysterectomy in 1985 is deferred to the clinic for management of thalassemia minor.  The patient was diagnosed with thalassemia minor in 1974.  She was seen by Dr. Sophronia Simas here at the [facility] in 2009.  She has been taking 800 mcg of total of folic acid daily as recommended at that time.   The patient has been following routinely with her primary care provider with routine blood work annually.  The patient notes that her ferritin has been slowly increasing over the last year or 2; therefore, the patient was referred to the clinic today for further evaluation and recommendations regarding her condition.  The patient's most recent CBC from 04/09/2020 shows a hemoglobin at 9.0.  The patient reports that she has been having stable hemoglobins around 9 for several years.  The patient's most recent ferritin from 04/09/2020 is 1197.  The patient denies any concerning complaints today.  The patient notes that she has a lot of energy.  She walks 3 miles a day without any shortness of breath.  She denies any lightheadedness or syncope.  She denies any salt or starch cravings.  Denies any palpitations or lightheadedness.  The patient states that she eats food with red meats about once every week or 2.  She has never required a blood transfusion.  She denies any jaundice.  She denies any endocrinopathies.  She denies any heart disease or liver disease.   The patient's family history significant for 2 brothers with multiple myeloma.  The patient's brothers also have thalassemia minor.  The patient has 2 nephews who also have thalassemia minor.  The patient's mother lived until she was 38 and  was diagnosed with hypertension, hypothyroidism, and passed away after having a fall/fracture.  The patient's father lived until he was 67.  He reportedly had hypertension and passed away from Alzheimer's.  The patient used to work as an Optometrist.  She is married and has 1 daughter and 2 grandchildren.  Patient denies any drug, alcohol, or tobacco use. HPI  Past Medical History:  Diagnosis Date  . Anemia   . Hx of adenomatous colonic polyps 09/18/2017  . Thalassanemia     Past Surgical History:  Procedure Laterality Date  . ABDOMINAL HYSTERECTOMY    . CESAREAN SECTION    . COLONOSCOPY      Family History  Problem Relation Age of Onset  . Colon polyps Mother   . Colon cancer Neg Hx   . Esophageal cancer Neg Hx   . Rectal cancer Neg Hx   . Stomach cancer Neg Hx     Social History Social History   Tobacco Use  . Smoking status: Never Smoker  . Smokeless tobacco: Never Used  Substance Use Topics  . Alcohol use: No  . Drug use: No    No Known Allergies  Current Outpatient Medications  Medication Sig Dispense Refill  . Cholecalciferol (VITAMIN D3) 10 MCG (400 UNIT) tablet Take 1 tablet by mouth 2 (two) times daily.    . fluticasone (CUTIVATE) 0.05 % cream Apply topically 2 (two) times daily. 30 g 2  . folic acid (FOLVITE) 130 MCG tablet Take 400 mcg by mouth daily.      Marland Kitchen  Garlic 4166 MG CAPS Take by mouth.    . Multiple Vitamins-Minerals (CENTRUM SILVER ADULT 50+) TABS Take 1 tablet by mouth daily.    . Tafluprost, PF, (ZIOPTAN) 0.0015 % SOLN Apply to eye at bedtime.    . Timolol Maleate 0.5 % (DAILY) SOLN Place 1 drop into both eyes daily.    . valACYclovir (VALTREX) 1000 MG tablet Take 2 tabs now and repeat in 12 hours 12 tablet 2  . Calcium Carbonate-Vitamin D 600-400 MG-UNIT per tablet Take 1 tablet by mouth daily.     . Multiple Vitamins-Minerals (QC WOMENS DAILY MULTIVITAMIN) TABS Take 1 tablet by mouth daily.      . Tafluprost, PF, (ZIOPTAN) 0.0015 % SOLN Place  1 drop into both eyes daily.     No current facility-administered medications for this visit.   REVIEW OF SYSTEMS:   Review of Systems  Constitutional: Negative for appetite change, chills, fatigue, fever and unexpected weight change.  HENT:   Negative for mouth sores, nosebleeds, sore throat and trouble swallowing.   Eyes: Negative for eye problems and icterus.  Respiratory: Negative for cough, hemoptysis, shortness of breath and wheezing.   Cardiovascular: Negative for chest pain and leg swelling.  Gastrointestinal: Negative for abdominal pain, constipation, diarrhea, nausea and vomiting.  Genitourinary: Negative for bladder incontinence, difficulty urinating, dysuria, frequency and hematuria.   Musculoskeletal: Negative for back pain, gait problem, neck pain and neck stiffness.  Skin: Negative for itching and rash.  Neurological: Negative for dizziness, extremity weakness, gait problem, headaches, light-headedness and seizures.  Hematological: Negative for adenopathy. Does not bruise/bleed easily.  Psychiatric/Behavioral: Negative for confusion, depression and sleep disturbance. The patient is not nervous/anxious.     PHYSICAL EXAMINATION:  Blood pressure (!) 152/74, pulse 69, temperature 98.1 F (36.7 C), temperature source Temporal, resp. rate 18, height _0  (1.549 m), weight 133 lb 6.4 oz (60.5 kg), SpO2 99 %.  ECOG PERFORMANCE STATUS: 0  Physical Exam  Constitutional: Oriented to person, place, and time and well-developed, well-nourished, and in no distress.  HENT:  Head: Normocephalic and atraumatic.  Mouth/Throat: Oropharynx is clear and moist. No oropharyngeal exudate.  Eyes: Conjunctivae are normal. Right eye exhibits no discharge. Left eye exhibits no discharge. No scleral icterus.  Neck: Normal range of motion. Neck supple.  Cardiovascular: Normal rate, regular rhythm, normal heart sounds and intact distal pulses.   Pulmonary/Chest: Effort normal and breath sounds  normal. No respiratory distress. No wheezes. No rales.  Abdominal: Soft. Bowel sounds are normal. Exhibits no distension and no mass. There is no tenderness.  Musculoskeletal: Normal range of motion. Exhibits no edema.  Lymphadenopathy:    No cervical adenopathy.  Neurological: Alert and oriented to person, place, and time. Exhibits normal muscle tone. Gait normal. Coordination normal.  Skin: Skin is warm and dry. No rash noted. Not diaphoretic. No erythema. No pallor.  Psychiatric: Mood, memory and judgment normal.  Vitals reviewed.  LABORATORY DATA: Lab Results  Component Value Date   WBC 6.4 05/12/2020   HGB 9.3 (L) 05/12/2020   HCT 30.2 (L) 05/12/2020   MCV 57.9 (L) 05/12/2020   PLT 223 05/12/2020      Chemistry      Component Value Date/Time   NA 142 05/12/2020 1307   K 4.6 05/12/2020 1307   CL 104 05/12/2020 1307   CO2 31 05/12/2020 1307   BUN 13 05/12/2020 1307   CREATININE 0.95 05/12/2020 1307   CREATININE 0.81 12/02/2013 1034      Component  Value Date/Time   CALCIUM 10.1 05/12/2020 1307   ALKPHOS 83 05/12/2020 1307   AST 17 05/12/2020 1307   ALT 15 05/12/2020 1307   BILITOT 1.4 (H) 05/12/2020 1307       RADIOGRAPHIC STUDIES: No results found.  ASSESSMENT: This very pleasant 74 year old African-American female diagnosed with thalassemia minor.  She was diagnosed initially in 1974   PLAN: The patient was seen with Dr. Julien Nordmann today.  The patient had a repeat lab work performed with a CBC, CMP, folate, vitamin B12, LDH, ferritin, and iron studies.  The patient CBC demonstrated stable hemoglobin at 9.3.  The patient's iron studies, LDH, CMP, folate, and vitamin B12 were all unremarkable except for elevated bilirubin at 1.4 likely secondary to hemolysis.  The patient's ferritin is 719 today.  Dr. Julien Nordmann recommends evaluating the patient's liver and heart for iron deposition within cardiac MRI and a liver MRI.  If the patient's ferritin is greater than 1000  with evidence of iron overload/organ damage then Dr. Julien Nordmann may consider starting the patient on iron chelation therapy in the future.  For now, the patient was advised to continue to limit her consumption of iron rich food.  She is also advised to avoid multivitamins with iron.  We will see the patient back for follow-up visit in 2 to 3 weeks for evaluation and a repeat ferritin and to review the results of her imaging studies.  The patient voices understanding of current disease status and treatment options and is in agreement with the current care plan.  All questions were answered. The patient knows to call the clinic with any problems, questions or concerns. We can certainly see the patient much sooner if necessary.  Thank you so much for allowing me to participate in the care of Hannah Landry. I will continue to follow up the patient with you and assist in her care.  The total time spent in the appointment was 60 minutes.  Disclaimer: This note was dictated with voice recognition software. Similar sounding words can inadvertently be transcribed and may not be corrected upon review.   Maverick Dieudonne L Travelle Mcclimans May 12, 2020, 3:10 PM  ADDENDUM: Hematology/Oncology Attending: I had a face-to-face encounter with the patient today.  I recommended her care plan.  This is a very pleasant 74 years old African-American female with history of beta thalassemia minor diagnosed in 7.  The patient also continues to have anemia with hemoglobin in the range of 9-10 g./dL for the last several years.  She was seen by a hematologist at the cancer center several years ago for evaluation of her condition and she was monitored closely. She continues to have elevated ferritin and her primary care physician has been checking her on annual basis. Over the last few years her ferritin level has been increasing.  The last ferritin level on Apr 09, 2020 was elevated at 1197.  The patient was referred by her  primary care physician for [place] for evaluation and recommendation regarding her condition.  She does not take any iron supplements and she is very careful with her dietary iron rich diets. Repeat ferritin level today was down to 719. 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 Will check MRI of the liver and heart to rule out any iron deposition.  If the patient has any concerning findings or ferritin level in the range of 1000-1500,  we may consider the patient for iron chelating agent. We will see her back for follow-up visit in few weeks for evaluation with repeat ferritin level and also to discuss results of the MRIs. The patient was advised to call immediately if she has any other concerning symptoms in the interval.  Disclaimer: This note was dictated with voice recognition software. Similar sounding words can inadvertently be transcribed and may be missed upon review. Eilleen Kempf, MD 05/12/20

## 2020-05-13 ENCOUNTER — Other Ambulatory Visit: Payer: Self-pay | Admitting: Physician Assistant

## 2020-05-13 DIAGNOSIS — D563 Thalassemia minor: Secondary | ICD-10-CM

## 2020-05-13 DIAGNOSIS — D561 Beta thalassemia: Secondary | ICD-10-CM

## 2020-05-14 ENCOUNTER — Telehealth: Payer: Self-pay | Admitting: Physician Assistant

## 2020-05-14 NOTE — Telephone Encounter (Signed)
Scheduled per los. Called and spoke with patient. Confirmed appt 

## 2020-05-19 ENCOUNTER — Telehealth: Payer: Self-pay | Admitting: *Deleted

## 2020-05-19 NOTE — Telephone Encounter (Signed)
Patient called to question if she should reschedule her follow up with Cassandra Heilingoetter, PA to after her MRI scan dates which include two dates after her already scheduled visit with Cassie.  She states these were the best dates she could get.  Routed to PA to advise if follow up with her needs to be pushed out further to after scans.  Pending response.

## 2020-05-26 ENCOUNTER — Telehealth: Payer: Self-pay | Admitting: Internal Medicine

## 2020-05-26 ENCOUNTER — Other Ambulatory Visit: Payer: Self-pay | Admitting: Physician Assistant

## 2020-05-26 ENCOUNTER — Other Ambulatory Visit: Payer: Medicare HMO

## 2020-05-26 ENCOUNTER — Ambulatory Visit: Payer: Medicare HMO | Admitting: Physician Assistant

## 2020-05-26 DIAGNOSIS — D563 Thalassemia minor: Secondary | ICD-10-CM

## 2020-05-26 NOTE — Telephone Encounter (Signed)
Release: [id number] Faxed medical records to [facility] @ [phone]

## 2020-06-01 ENCOUNTER — Other Ambulatory Visit: Payer: Self-pay

## 2020-06-01 ENCOUNTER — Ambulatory Visit (HOSPITAL_COMMUNITY)
Admission: RE | Admit: 2020-06-01 | Discharge: 2020-06-01 | Disposition: A | Discharge status: Home / Self Care | Payer: Medicare HMO | Source: Ambulatory Visit | Attending: Physician Assistant | Admitting: Physician Assistant

## 2020-06-01 DIAGNOSIS — D563 Thalassemia minor: Secondary | ICD-10-CM | POA: Insufficient documentation

## 2020-06-01 IMAGING — MR MR ABDOMEN W/O CM
26 of 28 series · 46 of 48 positions shown · non-contrast
Comparison: Abdominal sonogram from [XS]
COMPARISON: Abdominal sonogram from [XS]
COMPARISON: Abdominal sonogram from [XS]

Addendum:
CLINICAL DATA: 73-year-old female with history of thalassemia

EXAM:
MRI ABDOMEN WITHOUT CONTRAST
TECHNIQUE: Multiplanar multisequence MR imaging was performed without the
administration of intravenous contrast.

[Series 3: T2 · coronal · 6.0mm · 1.56mm/px · 1 of 28 slices shown (1 of 2)]
[im 1/28]
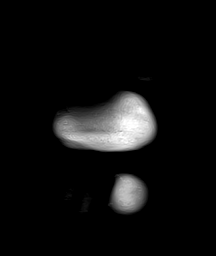

[Series 5: T2 fat-sat · axial · 6.0mm · 1.17mm/px · 1 of 40 slices shown]
[im 1/40]
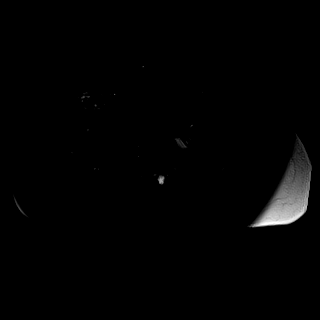

[Series 7: T1 · axial · 3.4mm · 1.16mm/px · 1 of 80 slices shown (1 of 3)]
[im 1/80]
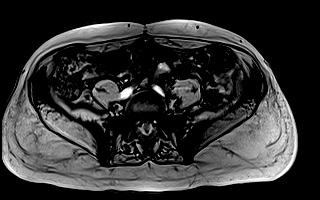

[Series 8: T1 · axial · 3.4mm · 1.16mm/px · 1 of 80 slices shown (2 of 3)]
[im 1/80]
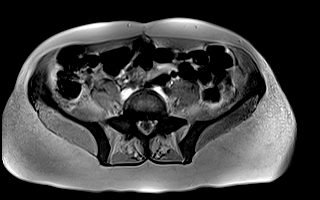

[Series 9: T1 · axial · 3.4mm · 1.16mm/px · z∈[-145,+124]mm · 2 of 80 slices shown (3 of 3)]
[im 1/80]
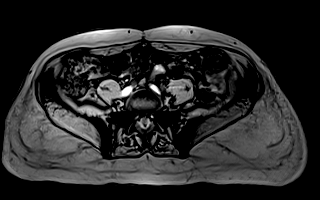
[im 80/80]
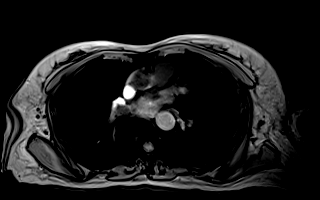

[Series 10: DWI · axial · 6.0mm · 1.38mm/px · z∈[-142,+131]mm · 2 of 78 slices shown (1 of 2)]
[im 1/78]
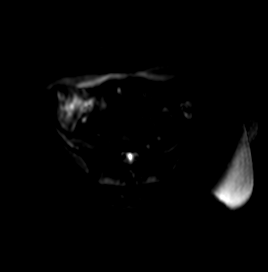
[im 78/78]
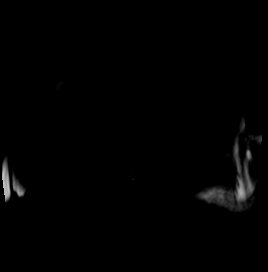

[Series 11: DWI · axial · 6.0mm · 1.38mm/px · 1 of 39 slices shown (2 of 2)]
[im 1/39]
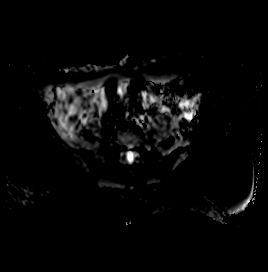

[Series 12: bSSFP · axial · 4.0mm · 0.74mm/px · z∈[-132,+112]mm · 2 of 62 slices shown]
[im 1/62]
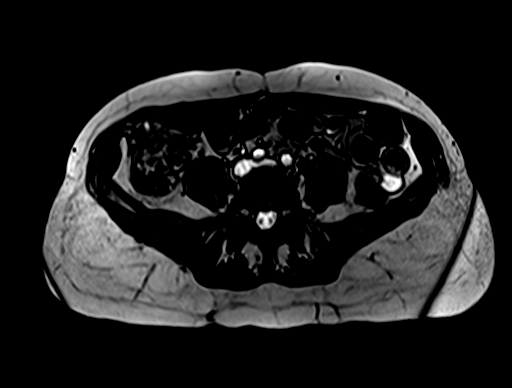
[im 62/62]
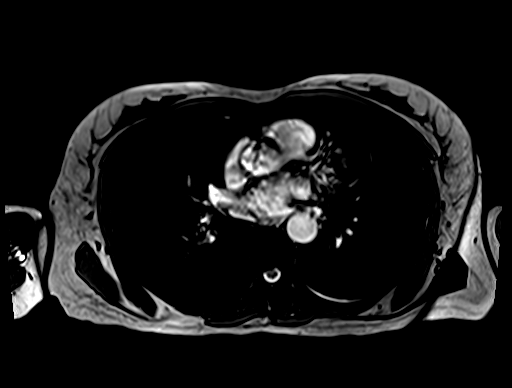

[Series 14: T1 dynamic · axial · 3.4mm · 1.12mm/px · z∈[-148,+120]mm · 2 of 80 slices shown]
[im 1/80]
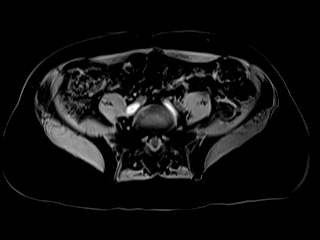
[im 80/80]
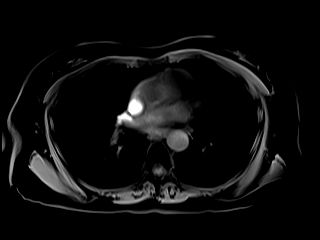

[Series 15: T2 · axial · 6.0mm · 1.45mm/px · 1 of 38 slices shown (2 of 2)]
[im 1/38]
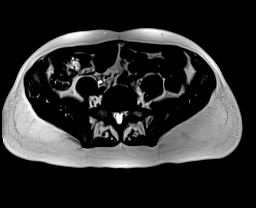

[Series 16: vibe_q-dixon_tra_bh_w · axial · 3.7mm · 1.12mm/px · z∈[-132,+101]mm · 2 of 64 slices shown]
[im 1/64]
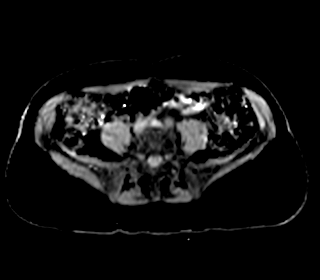
[im 64/64]
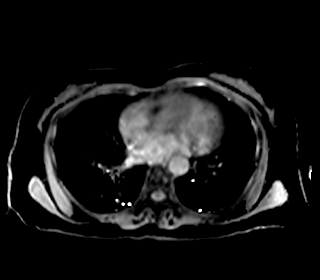

[Series 17: vibe_q-dixon_tra_bh_f · axial · 3.7mm · 1.12mm/px · z∈[-132,+101]mm · 2 of 63 slices shown]
[im 1/63]
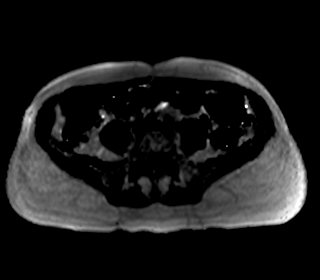
[im 63/63]
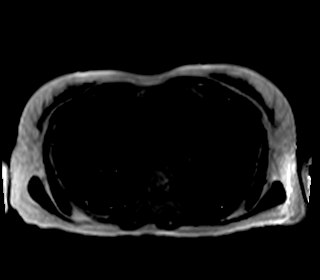

[Series 19: vibe_q-dixon_tra_bh_ff · axial · 3.7mm · 2.25mm/px · z∈[-132,+101]mm · 2 of 64 slices shown]
[im 1/64]
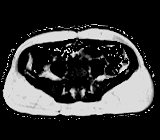
[im 64/64]
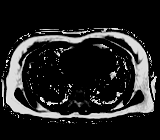

[Series 20: vibe_q-dixon_tra_bh_goodnessoffit · axial · 3.7mm · 2.25mm/px · z∈[-132,+101]mm · 2 of 64 slices shown]
[im 1/64]
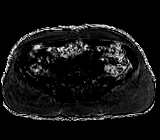
[im 64/64]
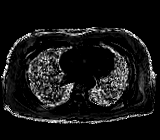

[Series 21: vibe_q-dixon_tra_bh_r2s_eff · axial · 3.7mm · 2.25mm/px · z∈[-132,+101]mm · 2 of 64 slices shown]
[im 1/64]
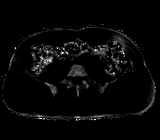
[im 64/64]
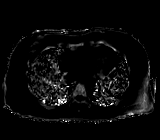

[Series 22: vibe_q-dixon_tra_bh_t2s_eff · axial · 3.7mm · 2.25mm/px · z∈[-132,+101]mm · 2 of 64 slices shown]
[im 1/64]
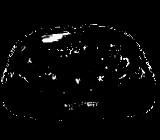
[im 64/64]
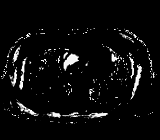

[Series 23: vibe_q-dixon_tra_bh_wf · axial · 3.7mm · 2.25mm/px · z∈[-132,+101]mm · 2 of 64 slices shown]
[im 1/64]
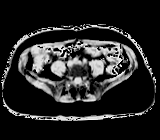
[im 64/64]
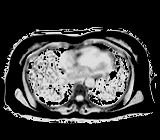

[Series 26: vibe_q-dixon_tra_bh lower_w · axial · 3.5mm · 1.12mm/px · z∈[-139,+109]mm · 2 of 72 slices shown]
[im 1/72]
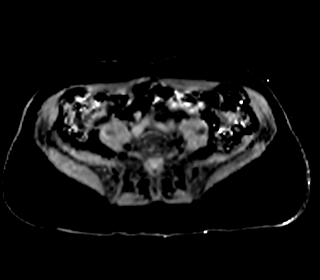
[im 72/72]
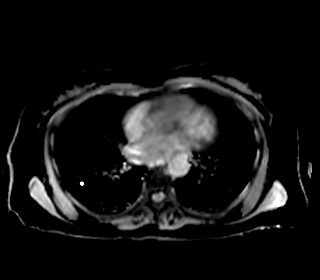

[Series 27: vibe_q-dixon_tra_bh lower_f · axial · 3.5mm · 1.12mm/px · z∈[-139,+109]mm · 2 of 71 slices shown]
[im 1/71]
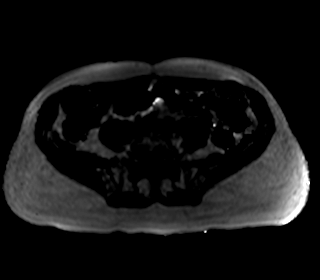
[im 71/71]
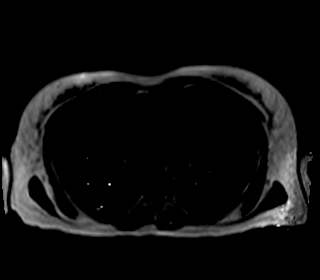

[Series 29: vibe_q-dixon_tra_bh lower_ff · axial · 3.5mm · 2.25mm/px · z∈[-139,+109]mm · 2 of 72 slices shown]
[im 1/72]
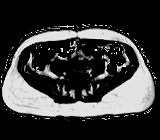
[im 72/72]
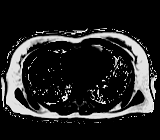

[Series 30: vibe_q-dixon_tra_bh lower_goodnessoffit · axial · 3.5mm · 2.25mm/px · z∈[-139,+109]mm · 2 of 72 slices shown]
[im 1/72]
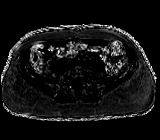
[im 72/72]
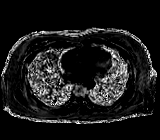

[Series 31: vibe_q-dixon_tra_bh lower_r2s_eff · axial · 3.5mm · 2.25mm/px · z∈[-139,+109]mm · 2 of 72 slices shown]
[im 1/72]
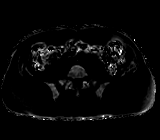
[im 72/72]
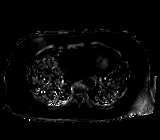

[Series 32: vibe_q-dixon_tra_bh lower_t2s_eff · axial · 3.5mm · 2.25mm/px · z∈[-139,+109]mm · 2 of 72 slices shown]
[im 1/72]
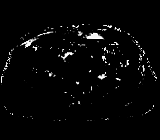
[im 72/72]
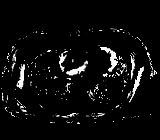

[Series 33: vibe_q-dixon_tra_bh lower_wf · axial · 3.5mm · 2.25mm/px · z∈[-139,+109]mm · 2 of 72 slices shown]
[im 1/72]
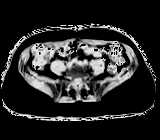
[im 72/72]
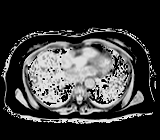

[Series 41: t1_vibe_e-dixon_tra_bh_pre_w · axial · 3.0mm · 1.03mm/px · z∈[-158,+79]mm · 2 of 80 slices shown]
[im 1/80]
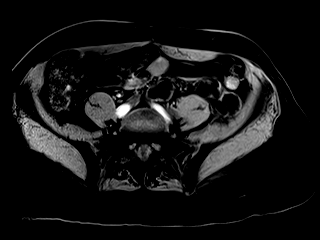
[im 80/80]
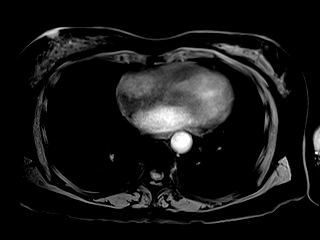

[Series 42: t1_vibe_e-dixon_tra_bh_pre_w_seg · axial · 3.0mm · 1.03mm/px · z∈[-158,+79]mm · 2 of 80 slices shown]
[im 1/80]
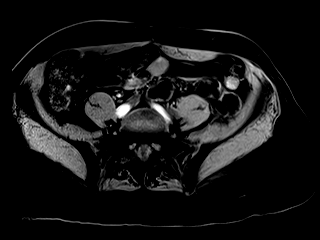
[im 80/80]
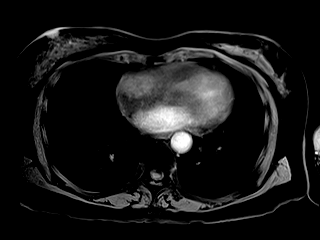

[46 of 48 positions shown; findings below may reference images not displayed]

FINDINGS: Lower chest: No consolidation or signs of pleural effusion. Limited
assessment of the chest on MRI.

Hepatobiliary: Signs of iron deposition in the liver with signal
loss on T2 and in phase as compared to opposed phase gradient echo
imaging. Cholelithiasis without biliary duct dilation.

Pancreas: Normal intrinsic T1 signal in the pancreas. No ductal
dilation or peripancreatic inflammation.

Spleen: Signs of splenic iron deposition with loss of signal on in
phase as compared to opposed phase T1 weighted gradient echo
imaging.

Adrenals/Urinary Tract: Adrenal glands are normal. Renal contours
are smooth. Limited assessment in the absence of intravenous
contrast.

Stomach/Bowel: Gastrointestinal tract without acute process to the
extent evaluated.

Vascular/Lymphatic: Vascular structures in the abdomen are patent to
the extent evaluated on noncontrast imaging.

Other:  No ascites.

Musculoskeletal: Findings that suggest some iron deposition in
marrow spaces due to signal characteristics of the spine on T2
weighted imaging and on in and out of phase gradient echo sequences.
IMPRESSION: 1. Signs of iron deposition in the liver, spleen and marrow spaces.
2. Based on R2* for the regions of interest, iron deposition at the
upper range of "mild" within the hepatic parenchyma is estimated.
This is extrapolated from liver iron content according to the
calibration curve from KLEVER et. al.
3. Cholelithiasis without biliary duct dilation.
4. Hepatic cysts.

ADDENDUM:
The patient is returning for additional imaging to complete the
liver lab evaluation there is iron deposition as above. Repeat
imaging will ensure that the most accurate data is presented.

ADDENDUM:
Fat percentage in the liver is approximately in the range of 1-5
percent fat fraction.

Values for hepatic iron, R2 star artifact range between fall in the
upper to midportion of the "mild" range as described on the previous
report. Approximately 6.4 milligrams/gram of iron based on an
approximate calculation for 1.5 tesla acquisition.

*** End of Addendum ***
Addendum:
FINDINGS: Lower chest: No consolidation or signs of pleural effusion. Limited
assessment of the chest on MRI.

Hepatobiliary: Signs of iron deposition in the liver with signal
loss on T2 and in phase as compared to opposed phase gradient echo
imaging. Cholelithiasis without biliary duct dilation.

Pancreas: Normal intrinsic T1 signal in the pancreas. No ductal
dilation or peripancreatic inflammation.

Spleen: Signs of splenic iron deposition with loss of signal on in
phase as compared to opposed phase T1 weighted gradient echo
imaging.

Adrenals/Urinary Tract: Adrenal glands are normal. Renal contours
are smooth. Limited assessment in the absence of intravenous
contrast.

Stomach/Bowel: Gastrointestinal tract without acute process to the
extent evaluated.

Vascular/Lymphatic: Vascular structures in the abdomen are patent to
the extent evaluated on noncontrast imaging.

Other:  No ascites.

Musculoskeletal: Findings that suggest some iron deposition in
marrow spaces due to signal characteristics of the spine on T2
weighted imaging and on in and out of phase gradient echo sequences.
IMPRESSION: 1. Signs of iron deposition in the liver, spleen and marrow spaces.
2. Based on R2* for the regions of interest, iron deposition at the
upper range of "mild" within the hepatic parenchyma is estimated.
This is extrapolated from liver iron content according to the
calibration curve from KLEVER et. al.
3. Cholelithiasis without biliary duct dilation.
4. Hepatic cysts.

ADDENDUM:
The patient is returning for additional imaging to complete the
liver lab evaluation there is iron deposition as above. Repeat
imaging will ensure that the most accurate data is presented.

*** End of Addendum ***
FINDINGS: Lower chest: No consolidation or signs of pleural effusion. Limited
assessment of the chest on MRI.

Hepatobiliary: Signs of iron deposition in the liver with signal
loss on T2 and in phase as compared to opposed phase gradient echo
imaging. Cholelithiasis without biliary duct dilation.

Pancreas: Normal intrinsic T1 signal in the pancreas. No ductal
dilation or peripancreatic inflammation.

Spleen: Signs of splenic iron deposition with loss of signal on in
phase as compared to opposed phase T1 weighted gradient echo
imaging.

Adrenals/Urinary Tract: Adrenal glands are normal. Renal contours
are smooth. Limited assessment in the absence of intravenous
contrast.

Stomach/Bowel: Gastrointestinal tract without acute process to the
extent evaluated.

Vascular/Lymphatic: Vascular structures in the abdomen are patent to
the extent evaluated on noncontrast imaging.

Other:  No ascites.

Musculoskeletal: Findings that suggest some iron deposition in
marrow spaces due to signal characteristics of the spine on T2
weighted imaging and on in and out of phase gradient echo sequences.
IMPRESSION: 1. Signs of iron deposition in the liver, spleen and marrow spaces.
2. Based on R2* for the regions of interest, iron deposition at the
upper range of "mild" within the hepatic parenchyma is estimated.
This is extrapolated from liver iron content according to the
calibration curve from KLEVER et. al.
3. Cholelithiasis without biliary duct dilation.
4. Hepatic cysts.

## 2020-06-11 ENCOUNTER — Encounter: Payer: Self-pay | Admitting: Family Medicine

## 2020-06-19 ENCOUNTER — Other Ambulatory Visit: Payer: Self-pay | Admitting: Physician Assistant

## 2020-06-25 ENCOUNTER — Telehealth (HOSPITAL_COMMUNITY): Payer: Self-pay | Admitting: Emergency Medicine

## 2020-06-25 ENCOUNTER — Other Ambulatory Visit (HOSPITAL_COMMUNITY): Payer: Medicare HMO

## 2020-06-25 NOTE — Telephone Encounter (Signed)
Reaching out to patient to inform her that her cMR appt for tomorrow 06/26/20 will need rescheduled as her particular exam will need a cardiologist present to assist in the protocol.   After I discuss the case with MR techs and cardiology to determine the new date/time for her appt, I will give her a call back.   She appreciated the call and information knowing we are doing whats best for her care.  Marchia Bond RN Navigator Cardiac Imaging [facility] [phone] Office  [phone] Cell

## 2020-06-26 ENCOUNTER — Ambulatory Visit (HOSPITAL_COMMUNITY): Admission: RE | Admit: 2020-06-26 | Payer: Medicare HMO | Source: Ambulatory Visit

## 2020-06-30 ENCOUNTER — Telehealth: Payer: Self-pay | Admitting: *Deleted

## 2020-06-30 NOTE — Telephone Encounter (Signed)
Patient called to advise that she had her MR Liver (Abdomen) completed at [facility]  However the MRI cardiac needed to be performed at [facility] and the Cardiologist has to be available during the exam therefore the exam isn't until 07/27/2020.  She has a visit with Cassie this week.  Needs to know if that appointment should be pushed out until the MRI Cardiac is also completed.  Routed to [facility] to advise.

## 2020-07-02 ENCOUNTER — Ambulatory Visit: Payer: Medicare HMO | Admitting: Physician Assistant

## 2020-07-02 ENCOUNTER — Other Ambulatory Visit: Payer: Medicare HMO

## 2020-07-24 ENCOUNTER — Telehealth (HOSPITAL_COMMUNITY): Payer: Self-pay | Admitting: Emergency Medicine

## 2020-07-24 NOTE — Telephone Encounter (Signed)
Reaching out to patient to offer assistance regarding upcoming cardiac imaging study; pt verbalizes understanding of appt date/time, parking situation and where to check in, pre-test NPO status and medications ordered, and verified current allergies; name and call back number provided for further questions should they arise Hannah Bond RN Navigator Cardiac Imaging [facility] [phone] office [phone] cell  Pt denies claustro, denies implants   Gardiner Rhyme to be present to help with her protocol.

## 2020-07-27 ENCOUNTER — Other Ambulatory Visit: Payer: Self-pay

## 2020-07-27 ENCOUNTER — Ambulatory Visit (HOSPITAL_COMMUNITY)
Admission: RE | Admit: 2020-07-27 | Discharge: 2020-07-27 | Disposition: A | Discharge status: Home / Self Care | Payer: Medicare HMO | Source: Ambulatory Visit | Attending: Physician Assistant | Admitting: Physician Assistant

## 2020-07-27 DIAGNOSIS — D563 Thalassemia minor: Secondary | ICD-10-CM | POA: Diagnosis present

## 2020-07-27 IMAGING — MR MR CARD MORPHOLOGY WO/W CM
44 of 47 series · 44 of 47 positions shown · IV contrast (gadavist)
Comparison: none

CLINICAL DATA: Evaluate for cardiac iron overload

EXAM:
CARDIAC MRI
TECHNIQUE: The patient was scanned on a 1.5 Tesla Siemens magnet. A dedicated
cardiac coil was used. Functional imaging was done using Fiesta
sequences. [DATE], and 4 chamber views were done to assess for RWMA's.
Modified ENDI rule using a short axis stack was used to
calculate an ejection fraction on a dedicated work station using
[employer] software. The patient received 7 cc of Gadavist. After 10
minutes inversion recovery sequences were used to assess for
infiltration and scar tissue.
CONTRAST:  7 cc  of Gadavist

[Series 4: [id number] · axial · 8.0mm · 1.33mm/px · 1 of 16 slices shown]
[im 1/16]
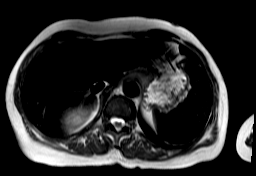

[Series 8: bSSFP · oblique · 8.0mm · 1.61mm/px · 1 of 25 slices shown (1 of 23)]
[im 1/25]
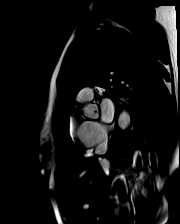

[Series 9: bSSFP · oblique · 8.0mm · 1.61mm/px · 1 of 25 slices shown (2 of 23)]
[im 1/25]
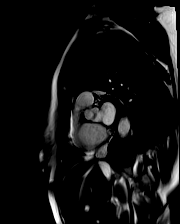

[Series 10: bSSFP · oblique · 8.0mm · 1.61mm/px · 1 of 25 slices shown (3 of 23)]
[im 1/25]
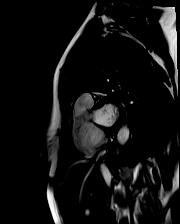

[Series 11: bSSFP · oblique · 8.0mm · 1.61mm/px · 1 of 25 slices shown (4 of 23)]
[im 1/25]
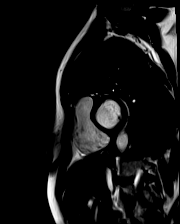

[Series 12: bSSFP · oblique · 8.0mm · 1.61mm/px · 1 of 25 slices shown (5 of 23)]
[im 1/25]
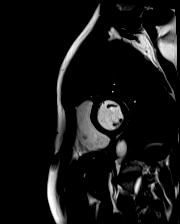

[Series 13: bSSFP · oblique · 8.0mm · 1.61mm/px · 1 of 25 slices shown (6 of 23)]
[im 1/25]
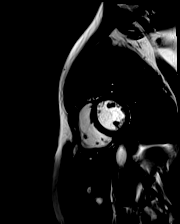

[Series 14: bSSFP · oblique · 8.0mm · 1.61mm/px · 1 of 25 slices shown (7 of 23)]
[im 1/25]
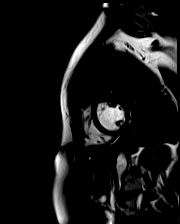

[Series 15: bSSFP · oblique · 8.0mm · 1.61mm/px · 1 of 25 slices shown (8 of 23)]
[im 1/25]
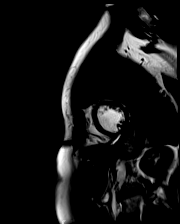

[Series 16: bSSFP · oblique · 8.0mm · 1.61mm/px · 1 of 25 slices shown (9 of 23)]
[im 1/25]
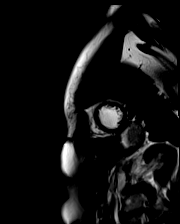

[Series 17: bSSFP · oblique · 8.0mm · 1.61mm/px · 1 of 25 slices shown (10 of 23)]
[im 1/25]
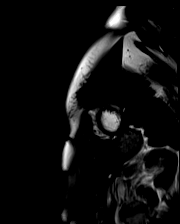

[Series 18: bSSFP · oblique · 8.0mm · 1.61mm/px · 1 of 25 slices shown (11 of 23)]
[im 1/25]
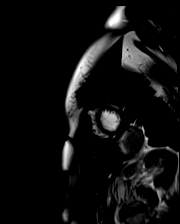

[Series 19: bSSFP · oblique · 8.0mm · 1.61mm/px · 1 of 25 slices shown (12 of 23)]
[im 1/25]
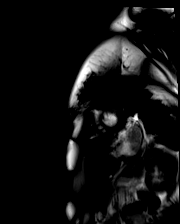

[Series 20: bSSFP · oblique · 8.0mm · 1.61mm/px · 1 of 25 slices shown (13 of 23)]
[im 1/25]
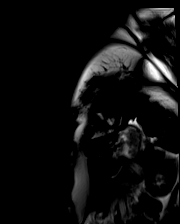

[Series 21: bSSFP · oblique · 8.0mm · 1.61mm/px · 1 of 25 slices shown (14 of 23)]
[im 1/25]
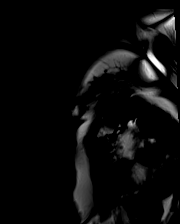

[Series 22: bSSFP · oblique · 8.0mm · 1.61mm/px · 1 of 25 slices shown (15 of 23)]
[im 1/25]
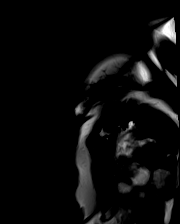

[Series 23: bSSFP · oblique · 8.0mm · 1.61mm/px · 1 of 25 slices shown (16 of 23)]
[im 1/25]
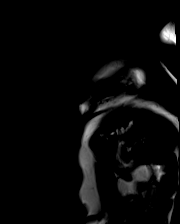

[Series 24: bSSFP · oblique · 8.0mm · 1.61mm/px · 1 of 25 slices shown (17 of 23)]
[im 1/25]
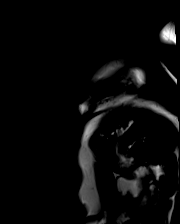

[Series 25: (id)_long_t1 · oblique · 8.0mm · 1.41mm/px · 1 of 24 slices shown]
[im 1/24]
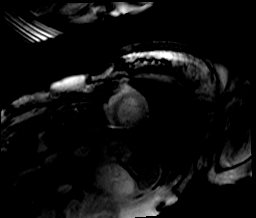

[Series 26: (id)_long_t1_moco · oblique · 8.0mm · 1.41mm/px · 1 of 24 slices shown]
[im 1/24]
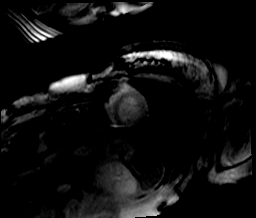

[Series 27: (id)_long_t1_moco_t1 · oblique · 8.0mm · 1.41mm/px · 1 of 6 slices shown]
[im 1/6]
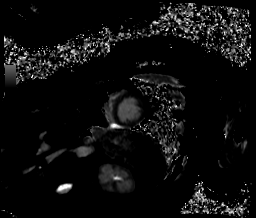

[Series 29: (id)_trufi · oblique · 8.0mm · 1.88mm/px · 1 of 9 slices shown]
[im 1/9]
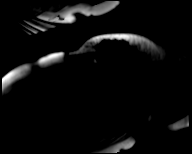

[Series 30: (id)_trufi_moco · oblique · 8.0mm · 1.88mm/px · 1 of 9 slices shown]
[im 1/9]
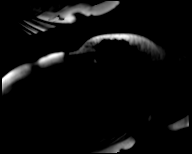

[Series 33: (id)__heart · oblique · 8.0mm · 1.79mm/px · 1 of 18 slices shown (1 of 2)]
[im 1/18]
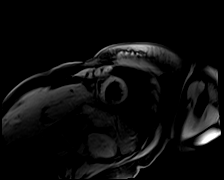

[Series 34: (id)__heart_(id) · oblique · 8.0mm · 1.79mm/px · 1 of 6 slices shown (1 of 2)]
[im 1/6]
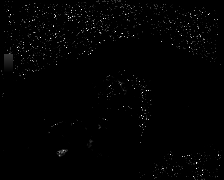

[Series 36: bSSFP · oblique · 6.0mm · 1.41mm/px · 1 of 25 slices shown (18 of 23)]
[im 1/25]
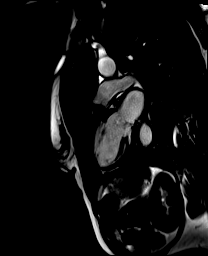

[Series 37: bSSFP · oblique · 6.0mm · 1.41mm/px · 1 of 25 slices shown (19 of 23)]
[im 1/25]
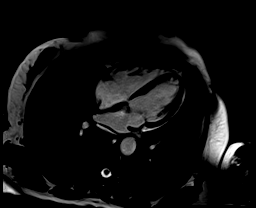

[Series 38: bSSFP · oblique · 6.0mm · 1.41mm/px · 1 of 25 slices shown (20 of 23)]
[im 1/25]
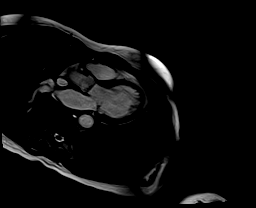

[Series 39: (id)__heart · oblique · 8.0mm · 1.79mm/px · 1 of 18 slices shown (2 of 2)]
[im 1/18]
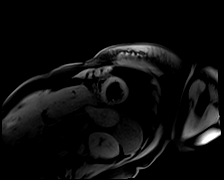

[Series 40: (id)__heart_(id) · oblique · 8.0mm · 1.79mm/px · 1 of 5 slices shown (2 of 2)]
[im 1/5]
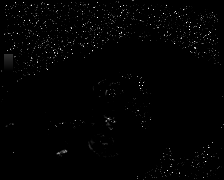

[Series 42: STIR · oblique · 8.0mm · 1.73mm/px · 1 of 17 slices shown]
[im 1/17]
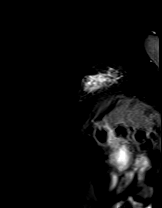

[Series 43: bSSFP · coronal · 6.0mm · 1.41mm/px · 1 of 25 slices shown (21 of 23)]
[im 1/25]
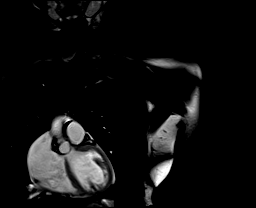

[Series 44: cine rvit · coronal · 6.0mm · 1.41mm/px · 1 of 25 slices shown]
[im 1/25]
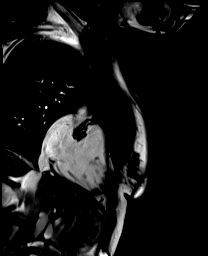

[Series 45: cine rvot · sagittal · 6.0mm · 1.41mm/px · 1 of 25 slices shown]
[im 1/25]
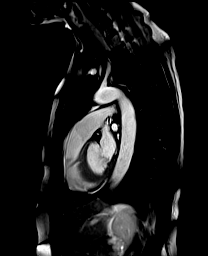

[Series 46: aortic valve cine · axial · 6.0mm · 1.41mm/px · 1 of 25 slices shown]
[im 1/25]
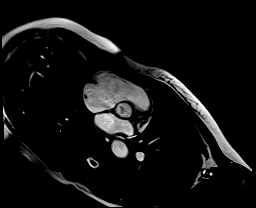

[Series 48: lge_single shot sa · oblique · 8.0mm · 1.98mm/px · 1 of 17 slices shown (1 of 2)]
[im 1/17]
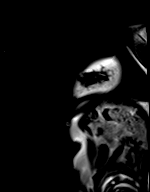

[Series 49: lge_single shot sa · oblique · 8.0mm · 1.98mm/px · 1 of 17 slices shown (2 of 2)]
[im 1/17]
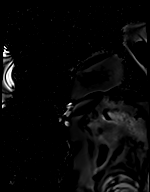

[Series 56: (id)_short_t1 · oblique · 8.0mm · 1.41mm/px · 1 of 27 slices shown]
[im 1/27]
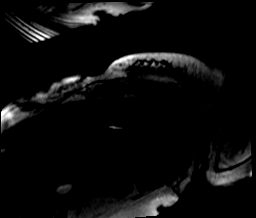

[Series 57: (id)_short_t1_moco · oblique · 8.0mm · 1.41mm/px · 1 of 27 slices shown]
[im 1/27]
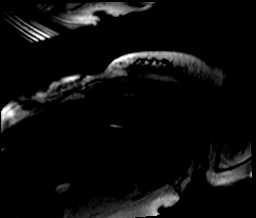

[Series 58: (id)_short_t1_moco_t1 · oblique · 8.0mm · 1.41mm/px · 1 of 6 slices shown]
[im 1/6]
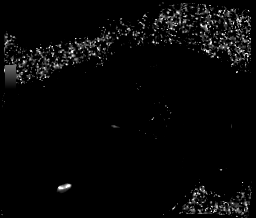

[Series 61: bSSFP · oblique · 8.0mm · 1.83mm/px · 1 of 17 slices shown (22 of 23)]
[im 1/17]
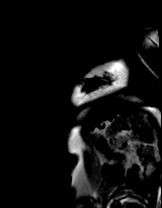

[Series 62: bSSFP · oblique · 8.0mm · 1.83mm/px · 1 of 17 slices shown (23 of 23)]
[im 1/17]
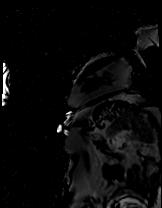

[Series 69: lge short axis · oblique · 8.0mm · 1.50mm/px · 1 of 15 slices shown (1 of 2)]
[im 1/15]
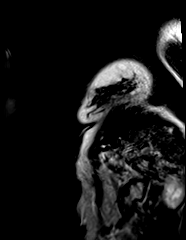

[Series 70: lge short axis · oblique · 8.0mm · 1.50mm/px · 1 of 15 slices shown (2 of 2)]
[im 1/15]
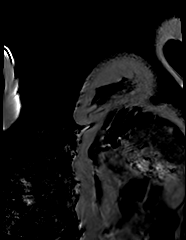

[44 of 47 positions shown; findings below may reference images not displayed]

FINDINGS: Left ventricle:

-Normal wall thickness

-Normal size

-Normal systolic function

-Mild nonspecific elevation in ECV (30%)

-Normal T2* (42 ms)

LV EF:  62% (Normal 56-78%)

Absolute volumes:

LV EDV: 100mL (Normal 52-141 mL)

LV ESV: 38mL (Normal 13-51 mL)

LV SV: 62mL (Normal 33-97 mL)

CO: 3.9L/min (Normal 2.7-6.0 L/min)

Indexed volumes:

LV EDV: 62mL/sq-m (Normal 41-81 mL/sq-m)

LV ESV: 23mL/sq-m (Normal 12-21 mL/sq-m)

LV SV: 39mL/sq-m (Normal 26-56 mL/sq-m)

CI: 2.4L/min/sq-m (Normal 1.8-3.8 L/min/sq-m)

Right ventricle: Normal size and systolic function

RV EF: 61% (Normal 47-80%)

Absolute volumes:

RV EDV: 108mL (Normal 58-154 mL)

RV ESV: 43mL (Normal 12-68 mL)

RV SV: 66mL (Normal 35-98 mL)

CO: 4.1L/min (Normal 2.7-6 L/min)

Indexed volumes:

RV EDV: 67mL/sq-m (Normal 48-87 mL/sq-m)

RV ESV: 26mL/sq-m (Normal 11-28 mL/sq-m)

RV SV: 41mL/sq-m (Normal 27-57 mL/sq-m)

CI: 2.6L/min/sq-m (Normal 1.8-3.8 L/min/sq-m)

Left atrium: Mild enlargement

Right atrium: Normal size

Mitral valve: No regurgitation

Aortic valve: Tricuspid.   No regurgitation

Tricuspid valve: No regurgitation

Pulmonic valve: No regurgitation

Aorta: Normal proximal ascending aorta

Pericardium: Normal
IMPRESSION: 1. No evidence of cardiac iron overload, with normal myocardial T2*
(42 ms)

2.  Normal LV size, wall thickness, and systolic function (EF 62%)

3.  Normal RV size and systolic function (EF 61 %)

4.  No late gadolinium enhancement to suggest myocardial scar

## 2020-07-27 MED ORDER — GADOBUTROL 1 MMOL/ML IV SOLN
7.0000 mL | Freq: Once | INTRAVENOUS | Status: AC | PRN
  Administered 2020-07-27: 7 mL via INTRAVENOUS

## 2020-07-28 NOTE — Progress Notes (Signed)
[facility] OFFICE PROGRESS NOTE  Hannah Covert, MD [place] [address]  DIAGNOSIS: Thalassemia minor  PRIOR THERAPY: None  CURRENT THERAPY: None   INTERVAL HISTORY: Hannah Landry 74 y.o. female returns to clinic today for a follow-up visit accompanied by her husband.  The patient was first seen in the clinic in June 2021 for thalassemia minor and an elevated ferritin.  The patient was feeling well at that visit without any concerning complaints.  Today, the patient continues to feel well without any significant fatigue or shortness of breath.  She denies any lightheadedness or syncope.  She denies any lightheadedness or palpitations.  She is avoiding red meat and food rich in iron. Denies pain, abdominal pain, nausea, or vomiting. Denies cough or palpitations. The patient had a cardiac MRI as well as an abdominal MRI to assess for iron deposition.  She is here today for evaluation, to review her scan results, and repeat CBC, ferritin, and iron studies.  MEDICAL HISTORY: Past Medical History:  Diagnosis Date  . Anemia   . Hx of adenomatous colonic polyps 09/18/2017  . Thalassanemia     ALLERGIES:  has No Known Allergies.  MEDICATIONS:  Current Outpatient Medications  Medication Sig Dispense Refill  . Cholecalciferol (VITAMIN D3) 10 MCG (400 UNIT) tablet Take 1 tablet by mouth 2 (two) times daily.    . fluticasone (CUTIVATE) 0.05 % cream Apply topically 2 (two) times daily. 30 g 2  . folic acid (FOLVITE) 154 MCG tablet Take 400 mcg by mouth daily.      . Garlic 0086 MG CAPS Take by mouth.    . Multiple Vitamins-Minerals (CENTRUM SILVER ADULT 50+) TABS Take 1 tablet by mouth daily.    . Tafluprost, PF, (ZIOPTAN) 0.0015 % SOLN Apply to eye at bedtime.    . Tafluprost, PF, (ZIOPTAN) 0.0015 % SOLN Place 1 drop into both eyes daily.    . Timolol Maleate 0.5 % (DAILY) SOLN Place 1 drop into both eyes daily.    . valACYclovir (VALTREX) 1000 MG  tablet Take 2 tabs now and repeat in 12 hours 12 tablet 2   No current facility-administered medications for this visit.    SURGICAL HISTORY:  Past Surgical History:  Procedure Laterality Date  . ABDOMINAL HYSTERECTOMY    . CESAREAN SECTION    . COLONOSCOPY      REVIEW OF SYSTEMS:   Review of Systems  Constitutional: Negative for appetite change, chills, fatigue, fever and unexpected weight change.  HENT: Negative for mouth sores, nosebleeds, sore throat and trouble swallowing.   Eyes: Negative for eye problems and icterus.  Respiratory: Negative for cough, hemoptysis, shortness of breath and wheezing.   Cardiovascular: Negative for chest pain and leg swelling.  Gastrointestinal: Negative for abdominal pain, constipation, diarrhea, nausea and vomiting.  Genitourinary: Negative for bladder incontinence, difficulty urinating, dysuria, frequency and hematuria.   Musculoskeletal: Negative for back pain, gait problem, neck pain and neck stiffness.  Skin: Negative for itching and rash.  Neurological: Negative for dizziness, extremity weakness, gait problem, headaches, light-headedness and seizures.  Hematological: Negative for adenopathy. Does not bruise/bleed easily.  Psychiatric/Behavioral: Negative for confusion, depression and sleep disturbance. The patient is not nervous/anxious.     PHYSICAL EXAMINATION:  Blood pressure (!) 142/66, pulse 64, temperature 97.8 F (36.6 C), temperature source Tympanic, resp. rate 18, height 5\' 1"  (1.549 m), weight 132 lb 11.2 oz (60.2 kg), SpO2 100 %.  ECOG PERFORMANCE STATUS: 1 - Symptomatic  but completely ambulatory  Physical Exam  Constitutional: Oriented to person, place, and time and well-developed, well-nourished, and in no distress.  HENT:  Head: Normocephalic and atraumatic.  Mouth/Throat: Oropharynx is clear and moist. No oropharyngeal exudate.  Eyes: Conjunctivae are normal. Right eye exhibits no discharge. Left eye exhibits no  discharge. No scleral icterus.  Neck: Normal range of motion. Neck supple.  Cardiovascular: Normal rate, regular rhythm, normal heart sounds and intact distal pulses.   Pulmonary/Chest: Effort normal and breath sounds normal. No respiratory distress. No wheezes. No rales.  Abdominal: Soft. Bowel sounds are normal. Exhibits no distension and no mass. There is no tenderness.  Musculoskeletal: Normal range of motion. Exhibits no edema.  Lymphadenopathy:    No cervical adenopathy.  Neurological: Alert and oriented to person, place, and time. Exhibits normal muscle tone. Gait normal. Coordination normal.  Skin: Skin is warm and dry. No rash noted. Not diaphoretic. No erythema. No pallor.  Psychiatric: Mood, memory and judgment normal.  Vitals reviewed.  LABORATORY DATA: Lab Results  Component Value Date   WBC 6.5 07/30/2020   HGB 8.9 (L) 07/30/2020   HCT 29.2 (L) 07/30/2020   MCV 58.4 (L) 07/30/2020   PLT 230 07/30/2020      Chemistry      Component Value Date/Time   NA 142 05/12/2020 1307   K 4.6 05/12/2020 1307   CL 104 05/12/2020 1307   CO2 31 05/12/2020 1307   BUN 13 05/12/2020 1307   CREATININE 0.95 05/12/2020 1307   CREATININE 0.81 12/02/2013 1034      Component Value Date/Time   CALCIUM 10.1 05/12/2020 1307   ALKPHOS 83 05/12/2020 1307   AST 17 05/12/2020 1307   ALT 15 05/12/2020 1307   BILITOT 1.4 (H) 05/12/2020 1307       RADIOGRAPHIC STUDIES:  MR CARDIAC MORPHOLOGY W WO CONTRAST  Result Date: 07/27/2020 CLINICAL DATA:  Evaluate for cardiac iron overload EXAM: CARDIAC MRI TECHNIQUE: The patient was scanned on a 1.5 Tesla Siemens magnet. A dedicated cardiac coil was used. Functional imaging was done using Fiesta sequences. 2,3, and 4 chamber views were done to assess for RWMA's. Modified Simpson's rule using a short axis stack was used to calculate an ejection fraction on a dedicated work [employer] officer, nature. The patient received 7 cc of Gadavist. After 10  minutes inversion recovery sequences were used to assess for infiltration and scar tissue. CONTRAST:  7 cc  of Gadavist FINDINGS: Left ventricle: -Normal wall thickness -Normal size -Normal systolic function -Mild nonspecific elevation in ECV (30%) -Normal T2* (42 ms) LV EF:  62% (Normal 56-78%) Absolute volumes: LV EDV: 118mL (Normal 52-141 mL) LV ESV: 80mL (Normal 13-51 mL) LV SV: 59mL (Normal 33-97 mL) CO: 3.9L/min (Normal 2.7-6.0 L/min) Indexed volumes: LV EDV: 50mL/sq-m (Normal 41-81 mL/sq-m) LV ESV: 39mL/sq-m (Normal 12-21 mL/sq-m) LV SV: 1mL/sq-m (Normal 26-56 mL/sq-m) CI: 2.4L/min/sq-m (Normal 1.8-3.8 L/min/sq-m) Right ventricle: Normal size and systolic function RV EF: 22% (Normal 47-80%) Absolute volumes: RV EDV: 173mL (Normal 58-154 mL) RV ESV: 75mL (Normal 12-68 mL) RV SV: 93mL (Normal 35-98 mL) CO: 4.1L/min (Normal 2.7-6 L/min) Indexed volumes: RV EDV: 23mL/sq-m (Normal 48-87 mL/sq-m) RV ESV: 76mL/sq-m (Normal 11-28 mL/sq-m) RV SV: 60mL/sq-m (Normal 27-57 mL/sq-m) CI: 2.6L/min/sq-m (Normal 1.8-3.8 L/min/sq-m) Left atrium: Mild enlargement Right atrium: Normal size Mitral valve: No regurgitation Aortic valve: Tricuspid.   No regurgitation Tricuspid valve: No regurgitation Pulmonic valve: No regurgitation Aorta: Normal proximal ascending aorta Pericardium: Normal IMPRESSION: 1. No evidence of  cardiac iron overload, with normal myocardial T2* (42 ms) 2.  Normal LV size, wall thickness, and systolic function (EF 75%) 3.  Normal RV size and systolic function (EF 61 %) 4.  No late gadolinium enhancement to suggest myocardial scar Electronically Signed   By: Oswaldo Milian MD   On: 07/27/2020 12:26     ASSESSMENT/PLAN:  This very pleasant 74 year old African-American female diagnosed with thalassemia minor.  She was diagnosed initially in 1975  The patient was seen with Dr. Julien Nordmann today.  Dr. Julien Nordmann personally independently reviewed the patient's recent MRI of the heart and abdomen.  The scans  noted mild deposition of iron on her abdominal MRI. Her cardiac MRI was unremarkable.  Additionally, the patient CBC from today demonstrated a stable hemoglobin of 8.9.  The patient's ferritin is 753 today.   Dr. Julien Nordmann recommends the patient continue on observation with a repeat  ferritin performed in 6 months.  If the patient's ferritin is ever greater than ~1000-1500 and/or evidence of iron overload/organ damage, then Dr. Julien Nordmann may consider starting the patient on iron chelation therapy in the future.  The patient will continue to limit her consumption of iron rich food.  The patient was advised to call immediately if she has any concerning symptoms in the interval. The patient voices understanding of current disease status and treatment options and is in agreement with the current care plan. All questions were answered. The patient knows to call the clinic with any problems, questions or concerns. We can certainly see the patient much sooner if necessary   Orders Placed This Encounter  Procedures  . Ferritin    Standing Status:   Future    Standing Expiration Date:   07/30/2021     Hannah Vetsch L Saavi Mceachron, PA-C 07/30/20  ADDENDUM: Hematology/Oncology Attending: I had a face-to-face encounter with the patient today.  I recommended her care plan.  This is a very pleasant 74 years old African-American female with history of thalassemia minor with mild hemolysis.  The patient had significantly elevated ferritin level and she underwent MRIs of the heart as well as abdomen for evaluation of any iron deposition.  Her imaging studies were if not remarkable especially in the heart with mild deposition in the liver. Ferritin level recently is 753.  I discussed the lab results as well as the imaging studies with the patient and recommended for her to continue on observation for now.  I do not see the need for the patient to undergo any iron chelation unless her serum ferritin is over  1500. She will come back for follow-up visit in 6 months for evaluation and repeat CBC, iron study and ferritin. She was also advised to avoid consumption of any iron rich diets. The patient was advised to call immediately if she has any concerning symptoms in the interval.  Disclaimer: This note was dictated with voice recognition software. Similar sounding words can inadvertently be transcribed and may be missed upon review. Eilleen Kempf, MD 07/30/20

## 2020-07-30 ENCOUNTER — Telehealth: Payer: Self-pay | Admitting: Physician Assistant

## 2020-07-30 ENCOUNTER — Inpatient Hospital Stay: Payer: Medicare HMO | Attending: Physician Assistant

## 2020-07-30 ENCOUNTER — Other Ambulatory Visit: Payer: Self-pay | Admitting: *Deleted

## 2020-07-30 ENCOUNTER — Encounter: Payer: Self-pay | Admitting: Physician Assistant

## 2020-07-30 ENCOUNTER — Other Ambulatory Visit: Payer: Self-pay

## 2020-07-30 ENCOUNTER — Inpatient Hospital Stay (HOSPITAL_BASED_OUTPATIENT_CLINIC_OR_DEPARTMENT_OTHER): Payer: Medicare HMO | Admitting: Physician Assistant

## 2020-07-30 VITALS — BP 142/66 | HR 64 | Temp 97.8°F | Resp 18 | Ht 61.0 in | Wt 132.7 lb

## 2020-07-30 DIAGNOSIS — R7989 Other specified abnormal findings of blood chemistry: Secondary | ICD-10-CM | POA: Insufficient documentation

## 2020-07-30 DIAGNOSIS — D569 Thalassemia, unspecified: Secondary | ICD-10-CM | POA: Insufficient documentation

## 2020-07-30 DIAGNOSIS — D563 Thalassemia minor: Secondary | ICD-10-CM

## 2020-07-30 LAB — CBC WITH DIFFERENTIAL (CANCER CENTER ONLY)
Abs Immature Granulocytes: 0.01 10*3/uL (ref 0.00–0.07)
Basophils Absolute: 0 10*3/uL (ref 0.0–0.1)
Basophils Relative: 1 %
Eosinophils Absolute: 0.1 10*3/uL (ref 0.0–0.5)
Eosinophils Relative: 2 %
HCT: 29.2 % — ABNORMAL LOW (ref 36.0–46.0)
Hemoglobin: 8.9 g/dL — ABNORMAL LOW (ref 12.0–15.0)
Immature Granulocytes: 0 %
Lymphocytes Relative: 23 %
Lymphs Abs: 1.5 10*3/uL (ref 0.7–4.0)
MCH: 17.8 pg — ABNORMAL LOW (ref 26.0–34.0)
MCHC: 30.5 g/dL (ref 30.0–36.0)
MCV: 58.4 fL — ABNORMAL LOW (ref 80.0–100.0)
Monocytes Absolute: 0.4 10*3/uL (ref 0.1–1.0)
Monocytes Relative: 7 %
Neutro Abs: 4.4 10*3/uL (ref 1.7–7.7)
Neutrophils Relative %: 67 %
Platelet Count: 230 10*3/uL (ref 150–400)
RBC: 5 MIL/uL (ref 3.87–5.11)
RDW: 19.2 % — ABNORMAL HIGH (ref 11.5–15.5)
WBC Count: 6.5 10*3/uL (ref 4.0–10.5)
nRBC: 0.9 % — ABNORMAL HIGH (ref 0.0–0.2)

## 2020-07-30 LAB — FERRITIN: Ferritin: 753 ng/mL — ABNORMAL HIGH (ref 11–307)

## 2020-07-30 NOTE — Telephone Encounter (Signed)
Scheduled per los. Called and left msg. Mailed printout  °

## 2020-08-24 ENCOUNTER — Encounter: Payer: Self-pay | Admitting: Family Medicine

## 2020-09-15 ENCOUNTER — Ambulatory Visit (INDEPENDENT_AMBULATORY_CARE_PROVIDER_SITE_OTHER): Payer: Medicare HMO | Admitting: Family Medicine

## 2020-09-15 ENCOUNTER — Encounter: Payer: Self-pay | Admitting: Family Medicine

## 2020-09-15 ENCOUNTER — Other Ambulatory Visit: Payer: Self-pay

## 2020-09-15 VITALS — BP 137/65 | HR 66 | Wt 131.0 lb

## 2020-09-15 DIAGNOSIS — D563 Thalassemia minor: Secondary | ICD-10-CM

## 2020-09-15 DIAGNOSIS — Z23 Encounter for immunization: Secondary | ICD-10-CM

## 2020-09-15 DIAGNOSIS — Z0000 Encounter for general adult medical examination without abnormal findings: Secondary | ICD-10-CM

## 2020-09-15 DIAGNOSIS — E785 Hyperlipidemia, unspecified: Secondary | ICD-10-CM

## 2020-09-15 NOTE — Assessment & Plan Note (Signed)
Lab Results  Component Value Date   CHOL 196 10/02/2019   HDL 79 10/02/2019   LDLCALC 99 10/02/2019   TRIG 100 10/02/2019   CHOLHDL 2.5 10/02/2019   At goal continue diet control

## 2020-09-15 NOTE — Patient Instructions (Signed)
Good to see you today!  Thanks for coming in.  Keep doing what you are doing

## 2020-09-16 NOTE — Progress Notes (Signed)
.   Subjective:   Hannah Landry is a 74 y.o. female who presents for Medicare Annual (Subsequent) preventive examination.  Review of Systems    Feels well.  No complaints.  No weight loss or gi complaints or bleeding or masses       Objective:    Today's Vitals   09/15/20 0847  BP: 137/65  Pulse: 66  SpO2: 98%  Weight: 131 lb (59.4 kg)  PainSc: 0-No pain   Body mass index is 24.75 kg/m.  Advanced Directives 05/12/2020 10/02/2019 09/25/2018 09/20/2017 09/12/2017 08/17/2017 09/14/2016  Does Patient Have a Medical Advance Directive? No No No Yes Yes Yes Yes  Type of Advance Directive - - - Living will - [place];Living will Living will  Does patient want to make changes to medical advance directive? - - - Yes (Inpatient - patient defers changing a medical advance directive at this time) - - -  Copy of [place] in Chart? - - - - - - No - copy requested  Would patient like information on creating a medical advance directive? No - Patient declined No - Patient declined No - Patient declined - - - -    Current Medications (verified) Outpatient Encounter Medications as of 09/15/2020  Medication Sig  . Calcium Carbonate-Vit D-Min (CALTRATE 600+D PLUS MINERALS) 600-800 MG-UNIT TABS Take by mouth.  . folic acid (FOLVITE) 536 MCG tablet Take 400 mcg by mouth daily.    . Garlic 6440 MG CAPS Take by mouth.  . Multiple Vitamins-Minerals (CENTRUM SILVER ADULT 50+) TABS Take 1 tablet by mouth daily.  . Tafluprost, PF, (ZIOPTAN) 0.0015 % SOLN Place 1 drop into both eyes daily.  . Timolol Maleate 0.5 % (DAILY) SOLN Place 1 drop into both eyes daily.  . [DISCONTINUED] Tafluprost, PF, (ZIOPTAN) 0.0015 % SOLN Apply to eye at bedtime.  . fluticasone (CUTIVATE) 0.05 % cream Apply topically 2 (two) times daily. (Patient not taking: Reported on 09/15/2020)  . valACYclovir (VALTREX) 1000 MG tablet Take 2 tabs now and repeat in 12 hours (Patient not taking: Reported on  09/15/2020)  . [DISCONTINUED] Cholecalciferol (VITAMIN D3) 10 MCG (400 UNIT) tablet Take 1 tablet by mouth 2 (two) times daily.   No facility-administered encounter medications on file as of 09/15/2020.    Allergies (verified) Patient has no known allergies.   History: Past Medical History:  Diagnosis Date  . Anemia   . Hx of adenomatous colonic polyps 09/18/2017  . Thalassanemia    Past Surgical History:  Procedure Laterality Date  . ABDOMINAL HYSTERECTOMY    . CESAREAN SECTION    . COLONOSCOPY     Family History  Problem Relation Age of Onset  . Colon polyps Mother   . Colon cancer Neg Hx   . Esophageal cancer Neg Hx   . Rectal cancer Neg Hx   . Stomach cancer Neg Hx    Social History   Socioeconomic History  . Marital status: Married    Spouse name: Not on file  . Number of children: Not on file  . Years of education: Not on file  . Highest education level: Not on file  Occupational History  . Not on file  Tobacco Use  . Smoking status: Never Smoker  . Smokeless tobacco: Never Used  Substance and Sexual Activity  . Alcohol use: No  . Drug use: No  . Sexual activity: Not on file  Other Topics Concern  . Not on file  Social History  Narrative   Lives with husband Hannah Landry   Retired    Family in [place]    Social Determinants of Health   Financial Resource Strain:   . Difficulty of Paying Living Expenses: Not on file  Food Insecurity:   . Worried About Charity fundraiser in the Last Year: Not on file  . Ran Out of Food in the Last Year: Not on file  Transportation Needs:   . Lack of Transportation (Medical): Not on file  . Lack of Transportation (Non-Medical): Not on file  Physical Activity:   . Days of Exercise per Week: Not on file  . Minutes of Exercise per Session: Not on file  Stress:   . Feeling of Stress : Not on file  Social Connections:   . Frequency of Communication with Friends and Family: Not on file  . Frequency of Social Gatherings with  Friends and Family: Not on file  . Attends Religious Services: Not on file  . Active Member of Clubs or Organizations: Not on file  . Attends Archivist Meetings: Not on file  . Marital Status: Not on file    Tobacco Counseling Counseling given: Not Answered No tobacco  Clinical Intake:     Pain Score: 0-No pain           Diabetic?No    Activities of Daily Living No flowsheet data found.  Normal gait and ambulation  Patient Care Team: Lind Covert, MD as PCP - General  Indicate any recent Medical Services you may have received from other than [facility] providers in the past year (date may be approximate).     Assessment:   This is a routine wellness examination for Hannah Landry.  Hearing/Vision screen No exam data present  Dietary issues and exercise activities discussed:   At goal  Goals   None    Depression Screen PHQ 2/9 Scores 10/02/2019 09/25/2018 09/20/2017 09/14/2016 09/09/2015 12/02/2013 08/21/2013  PHQ - 2 Score 0 0 0 0 0 0 0    Fall Risk Fall Risk  10/09/2019 10/02/2019 10/04/2018 09/20/2017 10/21/2016  Falls in the past year? 0 0 0 No No  Comment Emmi Telephone Survey: data to providers prior to load - Emmi Telephone Survey: data to providers prior to load - Emmi Telephone Survey: data to providers prior to load  Number falls in past yr: - 0 - - -     TIMED UP AND GO:  Mobility:able to get up and down from exam table without assistance or distress   Cognitive Function:  Normal     Immunizations Immunization History  Administered Date(s) Administered  . DTaP 01/22/2004  . Fluad Quad(high Dose 65+) 10/02/2019  . Influenza Split 11/21/2012  . Influenza,inj,Quad PF,6+ Mos 08/21/2013, 08/27/2014, 09/09/2015, 09/14/2016, 09/20/2017, 09/25/2018, 09/15/2020  . PFIZER SARS-COV-2 Vaccination 12/26/2019, 01/16/2020, 08/13/2020  . Pneumococcal Conjugate-13 09/09/2015  . Pneumococcal Polysaccharide-23 05/09/2011  . Tdap 03/18/2014  .  Zoster 11/27/2008  . Zoster Recombinat (Shingrix) 11/27/2018, 05/15/2019    TDAP status: Up to date Flu Vaccine status: Up to date Pneumococcal vaccine status: Up to date Covid-19 vaccine status: Completed vaccines  Qualifies for Shingles Vaccine? No   Zostavax completed Yes   Shingrix Completed?: Yes  Screening Tests Health Maintenance  Topic Date Due  . DTAP VACCINES (1) 12/19/1946  . MAMMOGRAM  06/11/2022  . COLONOSCOPY  09/12/2022  . DTaP/Tdap/Td (3 - Td or Tdap) 03/18/2024  . TETANUS/TDAP  03/18/2024  . INFLUENZA VACCINE  Completed  .  DEXA SCAN  Completed  . COVID-19 Vaccine  Completed  . Hepatitis C Screening  Completed  . PNA vac Low Risk Adult  Completed    Health Maintenance  Health Maintenance Due  Topic Date Due  . DTAP VACCINES (1) 12/19/1946    Current on all screening   Additional Screening:   Vision Screening: Recommended annual ophthalmology exams for early detection of glaucoma and other disorders of the eye. Is the patient up to date with their annual eye exam?  Yes  Who is the provider or what is the name of the office in which the patient attends annual eye exams?  Dental Screening: Recommended annual dental exams for proper oral hygiene  Community Resource Referral / Chronic Care Management: CRR required this visit?  No   CCM required this visit?  No    Exam Heart - Regular rate and rhythm.  No murmurs, gallops or rubs.    Lungs:  Normal respiratory effort, chest expands symmetrically. Lungs are clear to auscultation, no crackles or wheezes. Skin:  Intact without suspicious lesions or rashes Abdomen: soft and non-tender without masses, organomegaly or hernias noted.  No guarding or rebound Extremities:  No cyanosis, edema, or deformity noted with good range of motion of all major joints.   Neck:  No deformities, thyromegaly, masses, or tenderness noted.   Supple with full range of motion without pain. Ears:  External ear exam shows no  significant lesions or deformities.  Otoscopic examination reveals clear canals, tympanic membranes are intact bilaterally without bulging, retraction, inflammation or discharge. Hearing is grossly normal bilaterall     Plan:     I have personally reviewed and noted the following in the patient's chart:   . Medical and social history . Use of alcohol, tobacco or illicit drugs  . Current medications and supplements . Functional ability and status . Nutritional status . Physical activity . Advanced directives . List of other physicians . Hospitalizations, surgeries, and ER visits in previous 12 months . Vitals . Screenings to include cognitive, depression, and falls . Referrals and appointments  In addition, I have reviewed and discussed with patient certain preventive protocols, quality metrics, and best practice recommendations. A written personalized care plan for preventive services as well as general preventive health recommendations were provided to patient.     Lind Covert, MD   09/16/2020

## 2021-01-27 ENCOUNTER — Other Ambulatory Visit: Payer: Self-pay

## 2021-01-27 ENCOUNTER — Inpatient Hospital Stay: Payer: Medicare HMO

## 2021-01-27 ENCOUNTER — Inpatient Hospital Stay: Payer: Medicare HMO | Attending: Internal Medicine | Admitting: Internal Medicine

## 2021-01-27 ENCOUNTER — Other Ambulatory Visit: Payer: Self-pay | Admitting: Medical Oncology

## 2021-01-27 ENCOUNTER — Encounter: Payer: Self-pay | Admitting: Internal Medicine

## 2021-01-27 VITALS — BP 130/68 | HR 65 | Temp 98.6°F | Resp 16 | Ht 61.0 in | Wt 131.2 lb

## 2021-01-27 DIAGNOSIS — Z79899 Other long term (current) drug therapy: Secondary | ICD-10-CM | POA: Insufficient documentation

## 2021-01-27 DIAGNOSIS — D563 Thalassemia minor: Secondary | ICD-10-CM

## 2021-01-27 DIAGNOSIS — Z86010 Personal history of colonic polyps: Secondary | ICD-10-CM | POA: Diagnosis not present

## 2021-01-27 DIAGNOSIS — R5383 Other fatigue: Secondary | ICD-10-CM | POA: Diagnosis not present

## 2021-01-27 DIAGNOSIS — R42 Dizziness and giddiness: Secondary | ICD-10-CM | POA: Insufficient documentation

## 2021-01-27 DIAGNOSIS — R7989 Other specified abnormal findings of blood chemistry: Secondary | ICD-10-CM | POA: Insufficient documentation

## 2021-01-27 LAB — CBC WITH DIFFERENTIAL (CANCER CENTER ONLY)
Abs Immature Granulocytes: 0.03 10*3/uL (ref 0.00–0.07)
Basophils Absolute: 0 10*3/uL (ref 0.0–0.1)
Basophils Relative: 0 %
Eosinophils Absolute: 0.1 10*3/uL (ref 0.0–0.5)
Eosinophils Relative: 1 %
HCT: 29.6 % — ABNORMAL LOW (ref 36.0–46.0)
Hemoglobin: 9.2 g/dL — ABNORMAL LOW (ref 12.0–15.0)
Immature Granulocytes: 0 %
Lymphocytes Relative: 16 %
Lymphs Abs: 1.4 10*3/uL (ref 0.7–4.0)
MCH: 17.9 pg — ABNORMAL LOW (ref 26.0–34.0)
MCHC: 31.1 g/dL (ref 30.0–36.0)
MCV: 57.7 fL — ABNORMAL LOW (ref 80.0–100.0)
Monocytes Absolute: 0.4 10*3/uL (ref 0.1–1.0)
Monocytes Relative: 5 %
Neutro Abs: 6.4 10*3/uL (ref 1.7–7.7)
Neutrophils Relative %: 78 %
Platelet Count: 238 10*3/uL (ref 150–400)
RBC: 5.13 MIL/uL — ABNORMAL HIGH (ref 3.87–5.11)
RDW: 19.5 % — ABNORMAL HIGH (ref 11.5–15.5)
WBC Count: 8.4 10*3/uL (ref 4.0–10.5)
nRBC: 1 % — ABNORMAL HIGH (ref 0.0–0.2)

## 2021-01-27 LAB — IRON AND TIBC
Iron: 59 ug/dL (ref 41–142)
Saturation Ratios: 20 % — ABNORMAL LOW (ref 21–57)
TIBC: 290 ug/dL (ref 236–444)
UIBC: 231 ug/dL (ref 120–384)

## 2021-01-27 LAB — CMP (CANCER CENTER ONLY)
ALT: 11 U/L (ref 0–44)
AST: 14 U/L — ABNORMAL LOW (ref 15–41)
Albumin: 4.7 g/dL (ref 3.5–5.0)
Alkaline Phosphatase: 71 U/L (ref 38–126)
Anion gap: 9 (ref 5–15)
BUN: 17 mg/dL (ref 8–23)
CO2: 28 mmol/L (ref 22–32)
Calcium: 9.5 mg/dL (ref 8.9–10.3)
Chloride: 104 mmol/L (ref 98–111)
Creatinine: 0.92 mg/dL (ref 0.44–1.00)
GFR, Estimated: >60 mL/min (ref >60)
Glucose, Bld: 99 mg/dL (ref 70–99)
Potassium: 4.3 mmol/L (ref 3.5–5.1)
Sodium: 141 mmol/L (ref 135–145)
Total Bilirubin: 1.8 mg/dL — ABNORMAL HIGH (ref 0.3–1.2)
Total Protein: 7.4 g/dL (ref 6.5–8.1)

## 2021-01-27 LAB — FERRITIN: Ferritin: 726 ng/mL — ABNORMAL HIGH (ref 11–307)

## 2021-01-27 NOTE — Progress Notes (Signed)
[place] [phone]   [phone]  OFFICE PROGRESS NOTE  Lind Covert, MD [place] [address]  DIAGNOSIS: Thalassemia minor  PRIOR THERAPY: None  CURRENT THERAPY: Observation  INTERVAL HISTORY: Hannah Landry 75 y.o. female returns to the clinic today for follow-up visit accompanied by her husband the patient is feeling fine today with no concerning complaints except for mild fatigue.  She has occasional dizzy spells.  She denied having any chest pain, shortness of breath, cough or hemoptysis.  She denied having any weight loss or night sweats.  She has no nausea, vomiting, diarrhea or constipation.  She has no headache or visual changes.  The patient is here today for evaluation and repeat blood work including CBC, comprehensive metabolic panel, iron study and ferritin.  MEDICAL HISTORY: Past Medical History:  Diagnosis Date  . Anemia   . Hx of adenomatous colonic polyps 09/18/2017  . Thalassanemia     ALLERGIES:  has No Known Allergies.  MEDICATIONS:  Current Outpatient Medications  Medication Sig Dispense Refill  . Calcium Carbonate-Vit D-Min (CALTRATE 600+D PLUS MINERALS) 600-800 MG-UNIT TABS Take by mouth.    . fluticasone (CUTIVATE) 0.05 % cream Apply topically 2 (two) times daily. (Patient not taking: Reported on 17/0/0174) 30 g 2  . folic acid (FOLVITE) 944 MCG tablet Take 400 mcg by mouth daily.      . Garlic 9675 MG CAPS Take by mouth.    . Multiple Vitamins-Minerals (CENTRUM SILVER ADULT 50+) TABS Take 1 tablet by mouth daily.    . Tafluprost, PF, (ZIOPTAN) 0.0015 % SOLN Place 1 drop into both eyes daily.    . Timolol Maleate 0.5 % (DAILY) SOLN Place 1 drop into both eyes daily.    . valACYclovir (VALTREX) 1000 MG tablet Take 2 tabs now and repeat in 12 hours (Patient not taking: Reported on 09/15/2020) 12 tablet 2   No current facility-administered medications for this visit.    SURGICAL  HISTORY:  Past Surgical History:  Procedure Laterality Date  . ABDOMINAL HYSTERECTOMY    . CESAREAN SECTION    . COLONOSCOPY      REVIEW OF SYSTEMS:  A comprehensive review of systems was negative except for: Constitutional: positive for fatigue   PHYSICAL EXAMINATION: General appearance: alert, cooperative, fatigued and no distress Head: Normocephalic, without obvious abnormality, atraumatic Neck: no adenopathy, no JVD, supple, symmetrical, trachea midline and thyroid not enlarged, symmetric, no tenderness/mass/nodules Lymph nodes: Cervical, supraclavicular, and axillary nodes normal. Resp: clear to auscultation bilaterally Back: symmetric, no curvature. ROM normal. No CVA tenderness. Cardio: regular rate and rhythm, S1, S2 normal, no murmur, click, rub or gallop GI: soft, non-tender; bowel sounds normal; no masses,  no organomegaly Extremities: extremities normal, atraumatic, no cyanosis or edema  ECOG PERFORMANCE STATUS: 1 - Symptomatic but completely ambulatory  Blood pressure 130/68, pulse 65, temperature 98.6 F (37 C), temperature source Tympanic, resp. rate 16, height 5\' 1"  (1.549 m), weight 131 lb 3.2 oz (59.5 kg), SpO2 99 %.  LABORATORY DATA: Lab Results  Component Value Date   WBC 8.4 01/27/2021   HGB 9.2 (L) 01/27/2021   HCT 29.6 (L) 01/27/2021   MCV 57.7 (L) 01/27/2021   PLT 238 01/27/2021      Chemistry      Component Value Date/Time   NA 142 05/12/2020 1307   K 4.6 05/12/2020 1307   CL 104 05/12/2020 1307   CO2 31 05/12/2020 1307  BUN 13 05/12/2020 1307   CREATININE 0.95 05/12/2020 1307   CREATININE 0.81 12/02/2013 1034      Component Value Date/Time   CALCIUM 10.1 05/12/2020 1307   ALKPHOS 83 05/12/2020 1307   AST 17 05/12/2020 1307   ALT 15 05/12/2020 1307   BILITOT 1.4 (H) 05/12/2020 1307       RADIOGRAPHIC STUDIES: No results found.  ASSESSMENT AND PLAN: This is a very pleasant 75 years old African-American female with history of  thalassemia minor as well as iron overload.  The patient is currently on observation and feeling fine. Repeat blood work today is unremarkable except for the persistent anemia and elevated ferritin level.  Her ferritin level today 726. I recommended for the patient to continue on observation with repeat blood work in 6 months. She had extensive imaging studies in the past that showed no concerning findings for iron deposition in the liver or heart. She was advised to call immediately if she has any concerning symptoms in the interval. The patient voices understanding of current disease status and treatment options and is in agreement with the current care plan.  All questions were answered. The patient knows to call the clinic with any problems, questions or concerns. We can certainly see the patient much sooner if necessary.   Disclaimer: This note was dictated with voice recognition software. Similar sounding words can inadvertently be transcribed and may not be corrected upon review.

## 2021-03-13 ENCOUNTER — Encounter: Payer: Self-pay | Admitting: Family Medicine

## 2021-07-06 ENCOUNTER — Encounter: Payer: Self-pay | Admitting: Family Medicine

## 2021-07-29 ENCOUNTER — Other Ambulatory Visit: Payer: Self-pay

## 2021-07-29 ENCOUNTER — Inpatient Hospital Stay: Payer: Medicare HMO | Attending: Internal Medicine

## 2021-07-29 ENCOUNTER — Telehealth: Payer: Self-pay | Admitting: Internal Medicine

## 2021-07-29 ENCOUNTER — Inpatient Hospital Stay (HOSPITAL_BASED_OUTPATIENT_CLINIC_OR_DEPARTMENT_OTHER): Payer: Medicare HMO | Admitting: Internal Medicine

## 2021-07-29 VITALS — BP 145/64 | HR 74 | Temp 96.7°F | Resp 19 | Ht 61.0 in | Wt 132.6 lb

## 2021-07-29 DIAGNOSIS — Z86010 Personal history of colonic polyps: Secondary | ICD-10-CM | POA: Insufficient documentation

## 2021-07-29 DIAGNOSIS — R5383 Other fatigue: Secondary | ICD-10-CM | POA: Diagnosis not present

## 2021-07-29 DIAGNOSIS — D563 Thalassemia minor: Secondary | ICD-10-CM | POA: Diagnosis not present

## 2021-07-29 LAB — CBC WITH DIFFERENTIAL (CANCER CENTER ONLY)
Abs Immature Granulocytes: 0.02 10*3/uL (ref 0.00–0.07)
Basophils Absolute: 0 10*3/uL (ref 0.0–0.1)
Basophils Relative: 0 %
Eosinophils Absolute: 0.1 10*3/uL (ref 0.0–0.5)
Eosinophils Relative: 1 %
HCT: 28 % — ABNORMAL LOW (ref 36.0–46.0)
Hemoglobin: 8.8 g/dL — ABNORMAL LOW (ref 12.0–15.0)
Immature Granulocytes: 0 %
Lymphocytes Relative: 20 %
Lymphs Abs: 1.2 10*3/uL (ref 0.7–4.0)
MCH: 18 pg — ABNORMAL LOW (ref 26.0–34.0)
MCHC: 31.4 g/dL (ref 30.0–36.0)
MCV: 57.4 fL — ABNORMAL LOW (ref 80.0–100.0)
Monocytes Absolute: 0.4 10*3/uL (ref 0.1–1.0)
Monocytes Relative: 7 %
Neutro Abs: 4 10*3/uL (ref 1.7–7.7)
Neutrophils Relative %: 72 %
Platelet Count: 220 10*3/uL (ref 150–400)
RBC: 4.88 MIL/uL (ref 3.87–5.11)
RDW: 18.9 % — ABNORMAL HIGH (ref 11.5–15.5)
WBC Count: 5.7 10*3/uL (ref 4.0–10.5)
nRBC: 0.9 % — ABNORMAL HIGH (ref 0.0–0.2)

## 2021-07-29 LAB — CMP (CANCER CENTER ONLY)
ALT: 12 U/L (ref 0–44)
AST: 14 U/L — ABNORMAL LOW (ref 15–41)
Albumin: 4.4 g/dL (ref 3.5–5.0)
Alkaline Phosphatase: 72 U/L (ref 38–126)
Anion gap: 9 (ref 5–15)
BUN: 16 mg/dL (ref 8–23)
CO2: 26 mmol/L (ref 22–32)
Calcium: 9.5 mg/dL (ref 8.9–10.3)
Chloride: 105 mmol/L (ref 98–111)
Creatinine: 0.85 mg/dL (ref 0.44–1.00)
GFR, Estimated: >60 mL/min (ref >60)
Glucose, Bld: 93 mg/dL (ref 70–99)
Potassium: 4.1 mmol/L (ref 3.5–5.1)
Sodium: 140 mmol/L (ref 135–145)
Total Bilirubin: 1.8 mg/dL — ABNORMAL HIGH (ref 0.3–1.2)
Total Protein: 6.8 g/dL (ref 6.5–8.1)

## 2021-07-29 LAB — IRON AND TIBC
Iron: 74 ug/dL (ref 41–142)
Saturation Ratios: 29 % (ref 21–57)
TIBC: 251 ug/dL (ref 236–444)
UIBC: 178 ug/dL (ref 120–384)

## 2021-07-29 LAB — RETICULOCYTES
Immature Retic Fract: 19.1 % — ABNORMAL HIGH (ref 2.3–15.9)
RBC.: 4.82 MIL/uL (ref 3.87–5.11)
Retic Count, Absolute: 157.6 10*3/uL (ref 19.0–186.0)
Retic Ct Pct: 3.3 % — ABNORMAL HIGH (ref 0.4–3.1)

## 2021-07-29 LAB — FERRITIN: Ferritin: 719 ng/mL — ABNORMAL HIGH (ref 11–307)

## 2021-07-29 NOTE — Progress Notes (Signed)
[place] [phone]   [phone]  OFFICE PROGRESS NOTE  Lind Covert, MD [place] [address]  DIAGNOSIS: Thalassemia minor  PRIOR THERAPY: None  CURRENT THERAPY: Observation  INTERVAL HISTORY: Hannah Landry 75 y.o. female returns to the clinic today for follow-up visit accompanied by her husband.  The patient is feeling fine today with no concerning complaints except for mild fatigue.  She still very active and exercise at regular basis.  She denied having any current chest pain, shortness of breath, cough or hemoptysis.  She denied having any fever or chills.  She has no nausea, vomiting, diarrhea or constipation.  She has no bleeding, bruises or ecchymosis.  She is here today for evaluation with repeat CBC, iron study and ferritin.  MEDICAL HISTORY: Past Medical History:  Diagnosis Date   Anemia    Hx of adenomatous colonic polyps 09/18/2017   Thalassanemia     ALLERGIES:  has No Known Allergies.  MEDICATIONS:  Current Outpatient Medications  Medication Sig Dispense Refill   Calcium Carbonate-Vit D-Min (CALTRATE 600+D PLUS MINERALS) 600-800 MG-UNIT TABS Take by mouth.     fluticasone (CUTIVATE) 0.05 % cream Apply topically 2 (two) times daily. (Patient not taking: Reported on 99991111) 30 g 2   folic acid (FOLVITE) A999333 MCG tablet Take 400 mcg by mouth daily.       Garlic 123XX123 MG CAPS Take by mouth.     Multiple Vitamins-Minerals (CENTRUM SILVER ADULT 50+) TABS Take 1 tablet by mouth daily.     Tafluprost, PF, (ZIOPTAN) 0.0015 % SOLN Place 1 drop into both eyes daily.     Timolol Maleate 0.5 % (DAILY) SOLN Place 1 drop into both eyes daily.     valACYclovir (VALTREX) 1000 MG tablet Take 2 tabs now and repeat in 12 hours (Patient not taking: Reported on 09/15/2020) 12 tablet 2   No current facility-administered medications for this visit.    SURGICAL HISTORY:  Past Surgical History:  Procedure  Laterality Date   ABDOMINAL HYSTERECTOMY     CESAREAN SECTION     COLONOSCOPY      REVIEW OF SYSTEMS:  A comprehensive review of systems was negative except for: Constitutional: positive for fatigue   PHYSICAL EXAMINATION: General appearance: alert, cooperative, fatigued, and no distress Head: Normocephalic, without obvious abnormality, atraumatic Neck: no adenopathy, no JVD, supple, symmetrical, trachea midline, and thyroid not enlarged, symmetric, no tenderness/mass/nodules Lymph nodes: Cervical, supraclavicular, and axillary nodes normal. Resp: clear to auscultation bilaterally Back: symmetric, no curvature. ROM normal. No CVA tenderness. Cardio: regular rate and rhythm, S1, S2 normal, no murmur, click, rub or gallop GI: soft, non-tender; bowel sounds normal; no masses,  no organomegaly Extremities: extremities normal, atraumatic, no cyanosis or edema  ECOG PERFORMANCE STATUS: 1 - Symptomatic but completely ambulatory  Blood pressure (!) 145/64, pulse 74, temperature (!) 96.7 F (35.9 C), temperature source Tympanic, resp. rate 19, height '5\' 1"'$  (1.549 m), weight 132 lb 9.6 oz (60.1 kg), SpO2 100 %.  LABORATORY DATA: Lab Results  Component Value Date   WBC 5.7 07/29/2021   HGB 8.8 (L) 07/29/2021   HCT 28.0 (L) 07/29/2021   MCV 57.4 (L) 07/29/2021   PLT 220 07/29/2021      Chemistry      Component Value Date/Time   NA 141 01/27/2021 1053   K 4.3 01/27/2021 1053   CL 104 01/27/2021 1053   CO2 28 01/27/2021 1053   BUN  17 01/27/2021 1053   CREATININE 0.92 01/27/2021 1053   CREATININE 0.81 12/02/2013 1034      Component Value Date/Time   CALCIUM 9.5 01/27/2021 1053   ALKPHOS 71 01/27/2021 1053   AST 14 (L) 01/27/2021 1053   ALT 11 01/27/2021 1053   BILITOT 1.8 (H) 01/27/2021 1053       RADIOGRAPHIC STUDIES: No results found.  ASSESSMENT AND PLAN: This is a very pleasant 75 years old African-American female with history of thalassemia minor as well as iron  overload.  The patient is feeling fine today with no concerning complaints even with the persistent anemia.  Her hemoglobin is down to 8.8 today. Iron study and ferritin are still pending.  I will also order reticulocyte count to evaluate her level of hemolysis. If no change in the iron study, I will see her back for follow-up visit in 6 months with repeat CBC, iron study, ferritin, LDH, reticulocyte count as well as haptoglobin. The patient was advised to call immediately if she has any concerning symptoms in the interval. The patient voices understanding of current disease status and treatment options and is in agreement with the current care plan.  All questions were answered. The patient knows to call the clinic with any problems, questions or concerns. We can certainly see the patient much sooner if necessary.   Disclaimer: This note was dictated with voice recognition software. Similar sounding words can inadvertently be transcribed and may not be corrected upon review.

## 2021-07-29 NOTE — Telephone Encounter (Signed)
Scheduled appt per 9/15 los - mailed letter with appt date and time   

## 2021-09-09 ENCOUNTER — Encounter: Payer: Self-pay | Admitting: Family Medicine

## 2021-09-21 ENCOUNTER — Ambulatory Visit (INDEPENDENT_AMBULATORY_CARE_PROVIDER_SITE_OTHER): Payer: Medicare HMO | Admitting: Family Medicine

## 2021-09-21 ENCOUNTER — Other Ambulatory Visit: Payer: Self-pay

## 2021-09-21 VITALS — BP 146/69 | HR 73 | Temp 98.1°F | Wt 131.4 lb

## 2021-09-21 DIAGNOSIS — E785 Hyperlipidemia, unspecified: Secondary | ICD-10-CM

## 2021-09-21 DIAGNOSIS — R030 Elevated blood-pressure reading, without diagnosis of hypertension: Secondary | ICD-10-CM | POA: Diagnosis not present

## 2021-09-21 DIAGNOSIS — Z23 Encounter for immunization: Secondary | ICD-10-CM

## 2021-09-21 MED ORDER — FLUTICASONE PROPIONATE 0.05 % EX CREA
TOPICAL_CREAM | Freq: Two times a day (BID) | CUTANEOUS | 2 refills | Status: DC
Start: 2021-09-21 — End: 2022-10-04

## 2021-09-21 MED ORDER — VALACYCLOVIR HCL 1 G PO TABS
ORAL_TABLET | ORAL | 2 refills | Status: DC
Start: 2021-09-21 — End: 2022-10-04

## 2021-09-21 NOTE — Patient Instructions (Signed)
Good to see you today - Thank you for coming in  Things we discussed today:  Your goal blood pressure is less than 140/90.  Check your blood pressure several times a week.  If regularly higher than this please let me know - either with MyChart or leaving a phone message. Next visit please bring in your blood pressure cuff.     Let me know if the balance is changing any

## 2021-09-21 NOTE — Progress Notes (Signed)
Subjective:   Hannah Landry is a 75 y.o. female who presents for Medicare Annual (Subsequent) preventive examination.      Objective:     Vitals: BP (!) 146/69   Pulse 73   Temp 98.1 F (36.7 C)   Wt 131 lb 6.4 oz (59.6 kg)   SpO2 98%   BMI 24.83 kg/m   Body mass index is 24.83 kg/m.  Advanced Directives 05/12/2020 10/02/2019 09/25/2018 09/20/2017 09/12/2017 08/17/2017 09/14/2016  Does Patient Have a Medical Advance Directive? No No No Yes Yes Yes Yes  Type of Advance Directive - - - Living will - [place];Living will Living will  Does patient want to make changes to medical advance directive? - - - Yes (Inpatient - patient defers changing a medical advance directive at this time) - - -  Copy of [place] in Chart? - - - - - - No - copy requested  Would patient like information on creating a medical advance directive? No - Patient declined No - Patient declined No - Patient declined - - - -    Tobacco Social History   Tobacco Use  Smoking Status Never  Smokeless Tobacco Never     Counseling given: Not Answered   Clinical Intake:     Pain Score: 0-No pain                 Past Medical History:  Diagnosis Date   Anemia    Hx of adenomatous colonic polyps 09/18/2017   Thalassanemia    Past Surgical History:  Procedure Laterality Date   ABDOMINAL HYSTERECTOMY     CESAREAN SECTION     COLONOSCOPY     Family History  Problem Relation Age of Onset   Colon polyps Mother    Colon cancer Neg Hx    Esophageal cancer Neg Hx    Rectal cancer Neg Hx    Stomach cancer Neg Hx    Social History   Socioeconomic History   Marital status: Married    Spouse name: Not on file   Number of children: Not on file   Years of education: Not on file   Highest education level: Not on file  Occupational History   Not on file  Tobacco Use   Smoking status: Never   Smokeless tobacco: Never  Substance and Sexual Activity    Alcohol use: No   Drug use: No   Sexual activity: Not on file  Other Topics Concern   Not on file  Social History Narrative   Lives with husband Hannah Landry   Retired    Family in [place]    Social Determinants of Health   Financial Resource Strain: Not on [employer] Insecurity: Not on file  Transportation Needs: Not on file  Physical Activity: Not on file  Stress: Not on file  Social Connections: Not on file    Outpatient Encounter Medications as of 09/21/2021  Medication Sig   Calcium Carbonate-Vit D-Min (CALTRATE 600+D PLUS MINERALS) 600-800 MG-UNIT TABS Take by mouth.   folic acid (FOLVITE) 242 MCG tablet Take 400 mcg by mouth daily.     Garlic 3536 MG CAPS Take by mouth.   Multiple Vitamins-Minerals (CENTRUM SILVER ADULT 50+) TABS Take 1 tablet by mouth daily.   Tafluprost, PF, (ZIOPTAN) 0.0015 % SOLN Place 1 drop into both eyes daily.   Timolol Maleate 0.5 % (DAILY) SOLN Place 1 drop into both eyes daily.   fluticasone (CUTIVATE) 0.05 %  cream Apply topically 2 (two) times daily.   valACYclovir (VALTREX) 1000 MG tablet Take 2 tabs now and repeat in 12 hours   [DISCONTINUED] fluticasone (CUTIVATE) 0.05 % cream Apply topically 2 (two) times daily. (Patient not taking: No sig reported)   [DISCONTINUED] valACYclovir (VALTREX) 1000 MG tablet Take 2 tabs now and repeat in 12 hours (Patient not taking: No sig reported)   No facility-administered encounter medications on file as of 09/21/2021.    Activities of Daily Living No flowsheet data found.  Patient Care Team: Lind Covert, MD as PCP - General    Assessment:   This is a routine wellness examination for Hannah Landry.  Exercise Activities and Dietary recommendations     Goals   None     Fall Risk Fall Risk  10/09/2019 10/02/2019 10/04/2018 09/20/2017 10/21/2016  Falls in the past year? 0 0 0 No No  Comment Emmi Telephone Survey: data to providers prior to load - Emmi Telephone Survey: data to providers prior to  load - Emmi Telephone Survey: data to providers prior to load  Number falls in past yr: - 0 - - -   Is the patient's home free of loose throw rugs in walkways, pet beds, electrical cords, etc?   yes      Grab bars in the bathroom? yes      Handrails on the stairs?   yes      Adequate lighting?   yes   Depression Screen PHQ 2/9 Scores 10/02/2019 09/25/2018 09/20/2017 09/14/2016  PHQ - 2 Score 0 0 0 0     Cognitive Function        Immunization History  Administered Date(s) Administered   DTaP 01/22/2004   Fluad Quad(high Dose 65+) 10/02/2019   Influenza Split 11/21/2012   Influenza,inj,Quad PF,6+ Mos 08/21/2013, 08/27/2014, 09/09/2015, 09/14/2016, 09/20/2017, 09/25/2018, 09/15/2020   PFIZER(Purple Top)SARS-COV-2 Vaccination 08/13/2020, 03/12/2021   Pneumococcal Conjugate-13 09/09/2015   Pneumococcal Polysaccharide-23 05/09/2011   Tdap 03/18/2014   Unspecified SARS-COV-2 Vaccination 12/26/2019, 01/16/2020, 09/09/2021   Zoster Recombinat (Shingrix) 11/27/2018, 05/15/2019   Zoster, Live 11/27/2008     Screening Tests Health Maintenance  Topic Date Due   DTAP VACCINES (1) 12/19/1946   INFLUENZA VACCINE  06/14/2021   COVID-19 Vaccine (6 - Booster) 11/04/2021   COLONOSCOPY (Pts 45-31yrs Insurance coverage will need to be confirmed)  09/12/2022   DTaP/Tdap/Td (3 - Td or Tdap) 03/18/2024   TETANUS/TDAP  03/18/2024   Pneumonia Vaccine 62+ Years old  Completed   DEXA SCAN  Completed   Hepatitis C Screening  Completed   Zoster Vaccines- Shingrix  Completed   HPV VACCINES  Aged Out    Cancer Screenings: Lung: Low Dose CT Chest recommended if Age 57-80 years, 30 pack-year currently smoking OR have quit w/in 15years. Patient does not qualify. Breast:  Up to date on Mammogram? No   Up to date of Bone Density/Dexa? Yes Colorectal: current  Additional Screenings: : Hepatitis C Screening:      Plan:    I have personally reviewed and noted the following in the patient's  chart:   Medical and social history Use of alcohol, tobacco or illicit drugs  Current medications and supplements Functional ability and status Nutritional status Physical activity Advanced directives List of other physicians Hospitalizations, surgeries, and ER visits in previous 12 months Vitals Screenings to include cognitive, depression, and falls Referrals and appointments  In addition, I have reviewed and discussed with patient certain preventive protocols, quality metrics, and best practice  recommendations. A written personalized care plan for preventive services as well as general preventive health recommendations were provided to patient.    Exam Heart - Regular rate and rhythm.  No murmurs, gallops or rubs.    Lungs:  Normal respiratory effort, chest expands symmetrically. Lungs are clear to auscultation, no crackles or wheezes. Abdomen: soft and non-tender without masses, organomegaly or hernias noted.  No guarding or rebound Extremities:  No cyanosis, edema, or deformity noted with good range of motion of all major joints.   Skin:  Intact without suspicious lesions or rashes Mobility:able to get up and down from exam table without assistance or distress Neurologic exam : Cn 2-7 intact Strength equal & normal in upper & lower extremities Able to walk on heels and toes.   Balance normal  Romberg normal, finger to nose normal  See Problem List      Lind Covert, MD  09/21/2021

## 2021-09-22 ENCOUNTER — Encounter: Payer: Self-pay | Admitting: Family Medicine

## 2021-09-22 DIAGNOSIS — I10 Essential (primary) hypertension: Secondary | ICD-10-CM

## 2021-09-22 DIAGNOSIS — R030 Elevated blood-pressure reading, without diagnosis of hypertension: Secondary | ICD-10-CM | POA: Insufficient documentation

## 2021-09-22 HISTORY — DX: Essential (primary) hypertension: I10

## 2021-09-22 LAB — LIPID PANEL
Chol/HDL Ratio: 3.2 ratio (ref 0.0–4.4)
Cholesterol, Total: 230 mg/dL — ABNORMAL HIGH (ref 100–199)
HDL: 73 mg/dL (ref >39)
LDL Chol Calc (NIH): 139 mg/dL — ABNORMAL HIGH (ref 0–99)
Triglycerides: 106 mg/dL (ref 0–149)
VLDL Cholesterol Cal: 18 mg/dL (ref 5–40)

## 2021-09-22 NOTE — Assessment & Plan Note (Signed)
Mild elevation of systolic pressure.  Given lack of other risk factors and intermittent lightheadness with quick standing do not think medication treatment would be beneficial

## 2021-09-27 ENCOUNTER — Encounter: Payer: Self-pay | Admitting: Family Medicine

## 2021-09-27 DIAGNOSIS — E785 Hyperlipidemia, unspecified: Secondary | ICD-10-CM

## 2021-10-01 ENCOUNTER — Other Ambulatory Visit: Payer: Self-pay | Admitting: Family Medicine

## 2021-10-01 DIAGNOSIS — E785 Hyperlipidemia, unspecified: Secondary | ICD-10-CM

## 2021-10-01 NOTE — Telephone Encounter (Signed)
Discsssed with her an husband about cholesterols  They both want to work on diet and recheck in 6 months  Her blood pressure at home averaging 120s/70s

## 2022-01-25 ENCOUNTER — Other Ambulatory Visit: Payer: Self-pay

## 2022-01-26 ENCOUNTER — Inpatient Hospital Stay: Payer: Medicare HMO

## 2022-01-26 ENCOUNTER — Other Ambulatory Visit: Payer: Self-pay

## 2022-01-26 ENCOUNTER — Inpatient Hospital Stay: Payer: Medicare HMO | Attending: Internal Medicine | Admitting: Internal Medicine

## 2022-01-26 VITALS — BP 126/67 | HR 86 | Temp 97.4°F | Resp 18

## 2022-01-26 DIAGNOSIS — R5383 Other fatigue: Secondary | ICD-10-CM | POA: Diagnosis not present

## 2022-01-26 DIAGNOSIS — D563 Thalassemia minor: Secondary | ICD-10-CM

## 2022-01-26 LAB — CBC WITH DIFFERENTIAL (CANCER CENTER ONLY)
Abs Immature Granulocytes: 0.02 10*3/uL (ref 0.00–0.07)
Basophils Absolute: 0 10*3/uL (ref 0.0–0.1)
Basophils Relative: 0 %
Eosinophils Absolute: 0.1 10*3/uL (ref 0.0–0.5)
Eosinophils Relative: 1 %
HCT: 28.2 % — ABNORMAL LOW (ref 36.0–46.0)
Hemoglobin: 8.8 g/dL — ABNORMAL LOW (ref 12.0–15.0)
Immature Granulocytes: 0 %
Lymphocytes Relative: 19 %
Lymphs Abs: 1.2 10*3/uL (ref 0.7–4.0)
MCH: 17.8 pg — ABNORMAL LOW (ref 26.0–34.0)
MCHC: 31.2 g/dL (ref 30.0–36.0)
MCV: 57 fL — ABNORMAL LOW (ref 80.0–100.0)
Monocytes Absolute: 0.3 10*3/uL (ref 0.1–1.0)
Monocytes Relative: 5 %
Neutro Abs: 4.7 10*3/uL (ref 1.7–7.7)
Neutrophils Relative %: 75 %
Platelet Count: 216 10*3/uL (ref 150–400)
RBC: 4.95 MIL/uL (ref 3.87–5.11)
RDW: 18.5 % — ABNORMAL HIGH (ref 11.5–15.5)
WBC Count: 6.3 10*3/uL (ref 4.0–10.5)
nRBC: 0.8 % — ABNORMAL HIGH (ref 0.0–0.2)

## 2022-01-26 LAB — IRON AND IRON BINDING CAPACITY (CC-WL,HP ONLY)
Iron: 96 ug/dL (ref 28–170)
Saturation Ratios: 33 % — ABNORMAL HIGH (ref 10.4–31.8)
TIBC: 295 ug/dL (ref 250–450)
UIBC: 199 ug/dL (ref 148–442)

## 2022-01-26 LAB — FERRITIN: Ferritin: 804 ng/mL — ABNORMAL HIGH (ref 11–307)

## 2022-01-26 LAB — VITAMIN B12: Vitamin B-12: 374 pg/mL (ref 180–914)

## 2022-01-26 LAB — RETICULOCYTES
Immature Retic Fract: 14.5 % (ref 2.3–15.9)
RBC.: 4.91 MIL/uL (ref 3.87–5.11)
Retic Count, Absolute: 129.6 10*3/uL (ref 19.0–186.0)
Retic Ct Pct: 2.6 % (ref 0.4–3.1)

## 2022-01-26 LAB — LACTATE DEHYDROGENASE: LDH: 133 U/L (ref 98–192)

## 2022-01-26 NOTE — Progress Notes (Signed)
?    [facility] ?[phone]   [phone] ? ?OFFICE PROGRESS NOTE ? ?Lind Covert, MD ?[address] ?[place] [address] ? ?DIAGNOSIS: Thalassemia minor ? ?PRIOR THERAPY: None ? ?CURRENT THERAPY: Observation ? ?INTERVAL HISTORY: ?Hannah Landry 76 y.o. female returns to the clinic today for follow-up visit accompanied by her husband.  The patient is feeling fine today with no concerning complaints except for mild fatigue.  She denied having any dizzy spells.  She has no shortness of breath.  She has no craving for food.  She has no nausea, vomiting, diarrhea or constipation.  She has no chest pain, cough or hemoptysis.  She has been on observation and taking multivitamins.  She is here today for evaluation with repeat blood work for evaluation of her anemia. ? ?MEDICAL HISTORY: ?Past Medical History:  ?Diagnosis Date  ? Anemia   ? Hx of adenomatous colonic polyps 09/18/2017  ? Thalassanemia   ? ? ?ALLERGIES:  has No Known Allergies. ? ?MEDICATIONS:  ?Current Outpatient Medications  ?Medication Sig Dispense Refill  ? Calcium Carbonate-Vit D-Min (CALTRATE 600+D PLUS MINERALS) 600-800 MG-UNIT TABS Take by mouth.    ? fluticasone (CUTIVATE) 0.05 % cream Apply topically 2 (two) times daily. 30 g 2  ? folic acid (FOLVITE) 622 MCG tablet Take 400 mcg by mouth daily.      ? Garlic 2979 MG CAPS Take by mouth.    ? Multiple Vitamins-Minerals (CENTRUM SILVER ADULT 50+) TABS Take 1 tablet by mouth daily.    ? Tafluprost, PF, (ZIOPTAN) 0.0015 % SOLN Place 1 drop into both eyes daily.    ? Timolol Maleate 0.5 % (DAILY) SOLN Place 1 drop into both eyes daily.    ? valACYclovir (VALTREX) 1000 MG tablet Take 2 tabs now and repeat in 12 hours 12 tablet 2  ? ?No current facility-administered medications for this visit.  ? ? ?SURGICAL HISTORY:  ?Past Surgical History:  ?Procedure Laterality Date  ? ABDOMINAL HYSTERECTOMY    ? CESAREAN SECTION    ? COLONOSCOPY    ? ? ?REVIEW OF  SYSTEMS:  A comprehensive review of systems was negative except for: Constitutional: positive for fatigue  ? ?PHYSICAL EXAMINATION: General appearance: alert, cooperative, fatigued, and no distress ?Head: Normocephalic, without obvious abnormality, atraumatic ?Neck: no adenopathy, no JVD, supple, symmetrical, trachea midline, and thyroid not enlarged, symmetric, no tenderness/mass/nodules ?Lymph nodes: Cervical, supraclavicular, and axillary nodes normal. ?Resp: clear to auscultation bilaterally ?Back: symmetric, no curvature. ROM normal. No CVA tenderness. ?Cardio: regular rate and rhythm, S1, S2 normal, no murmur, click, rub or gallop ?GI: soft, non-tender; bowel sounds normal; no masses,  no organomegaly ?Extremities: extremities normal, atraumatic, no cyanosis or edema ? ?ECOG PERFORMANCE STATUS: 1 - Symptomatic but completely ambulatory ? ?Blood pressure 126/67, pulse 86, temperature (!) 97.4 ?F (36.3 ?C), temperature source Tympanic, resp. rate 18, SpO2 98 %. ? ?LABORATORY DATA: ?Lab Results  ?Component Value Date  ? WBC 6.3 01/26/2022  ? HGB 8.8 (L) 01/26/2022  ? HCT 28.2 (L) 01/26/2022  ? MCV 57.0 (L) 01/26/2022  ? PLT 216 01/26/2022  ? ? ?  Chemistry   ?   ?Component Value Date/Time  ? NA 140 07/29/2021 0948  ? K 4.1 07/29/2021 0948  ? CL 105 07/29/2021 0948  ? CO2 26 07/29/2021 0948  ? BUN 16 07/29/2021 0948  ? CREATININE 0.85 07/29/2021 0948  ? CREATININE 0.81 12/02/2013 1034  ?    ?Component Value Date/Time  ?  CALCIUM 9.5 07/29/2021 0948  ? ALKPHOS 72 07/29/2021 0948  ? AST 14 (L) 07/29/2021 0948  ? ALT 12 07/29/2021 0948  ? BILITOT 1.8 (H) 07/29/2021 0948  ?  ? ? ? ?RADIOGRAPHIC STUDIES: ?No results found. ? ?ASSESSMENT AND PLAN: This is a very pleasant 76 years old African-American female with history of thalassemia minor as well as iron overload.  ?The patient is feeling fine today with no concerning complaints even with the persistent anemia.   ?Her CBC today showed a stable hemoglobin and hematocrit  of 8.8 and 28.2%.  The other lab results including iron study, ferritin, vitamin B12 level, serum folate as well as haptoglobin are still pending ?Reticulocyte count is unremarkable. ?I recommended for the patient continue on observation for now but I will call her with additional recommendation based on the pending lab work. ?She was advised to increase her iron rich diet. ?The patient will come back for follow-up visit in 6 months for evaluation and repeat blood work. ?She was advised to call immediately if she has any other concerning issues in the interval. ?The patient voices understanding of current disease status and treatment options and is in agreement with the current care plan. ? ?All questions were answered. The patient knows to call the clinic with any problems, questions or concerns. We can certainly see the patient much sooner if necessary. ? ? ?Disclaimer: This note was dictated with voice recognition software. Similar sounding words can inadvertently be transcribed and may not be corrected upon review. ? ? ?  ?  ?

## 2022-01-27 LAB — HAPTOGLOBIN: Haptoglobin: 113 mg/dL (ref 42–346)

## 2022-04-08 ENCOUNTER — Other Ambulatory Visit: Payer: Medicare HMO

## 2022-04-08 DIAGNOSIS — E785 Hyperlipidemia, unspecified: Secondary | ICD-10-CM

## 2022-04-09 LAB — LIPID PANEL
Chol/HDL Ratio: 2.7 ratio (ref 0.0–4.4)
Cholesterol, Total: 195 mg/dL (ref 100–199)
HDL: 72 mg/dL (ref >39)
LDL Chol Calc (NIH): 103 mg/dL — ABNORMAL HIGH (ref 0–99)
Triglycerides: 113 mg/dL (ref 0–149)
VLDL Cholesterol Cal: 20 mg/dL (ref 5–40)

## 2022-04-19 ENCOUNTER — Encounter: Payer: Self-pay | Admitting: *Deleted

## 2022-07-04 ENCOUNTER — Encounter: Payer: Self-pay | Admitting: Family Medicine

## 2022-07-13 ENCOUNTER — Encounter: Payer: Self-pay | Admitting: Internal Medicine

## 2022-07-28 ENCOUNTER — Inpatient Hospital Stay: Payer: Medicare HMO | Attending: Internal Medicine

## 2022-07-28 ENCOUNTER — Encounter: Payer: Self-pay | Admitting: Internal Medicine

## 2022-07-28 ENCOUNTER — Inpatient Hospital Stay (HOSPITAL_BASED_OUTPATIENT_CLINIC_OR_DEPARTMENT_OTHER): Payer: Medicare HMO | Admitting: Internal Medicine

## 2022-07-28 VITALS — BP 144/61 | HR 61 | Temp 98.1°F | Resp 15 | Wt 132.3 lb

## 2022-07-28 DIAGNOSIS — D563 Thalassemia minor: Secondary | ICD-10-CM | POA: Diagnosis not present

## 2022-07-28 DIAGNOSIS — R5383 Other fatigue: Secondary | ICD-10-CM | POA: Insufficient documentation

## 2022-07-28 DIAGNOSIS — D367 Benign neoplasm of other specified sites: Secondary | ICD-10-CM | POA: Diagnosis not present

## 2022-07-28 LAB — CBC WITH DIFFERENTIAL (CANCER CENTER ONLY)
Abs Immature Granulocytes: 0.02 10*3/uL (ref 0.00–0.07)
Basophils Absolute: 0 10*3/uL (ref 0.0–0.1)
Basophils Relative: 0 %
Eosinophils Absolute: 0.1 10*3/uL (ref 0.0–0.5)
Eosinophils Relative: 1 %
HCT: 28.6 % — ABNORMAL LOW (ref 36.0–46.0)
Hemoglobin: 8.7 g/dL — ABNORMAL LOW (ref 12.0–15.0)
Immature Granulocytes: 0 %
Lymphocytes Relative: 17 %
Lymphs Abs: 1.3 10*3/uL (ref 0.7–4.0)
MCH: 17.7 pg — ABNORMAL LOW (ref 26.0–34.0)
MCHC: 30.4 g/dL (ref 30.0–36.0)
MCV: 58.2 fL — ABNORMAL LOW (ref 80.0–100.0)
Monocytes Absolute: 0.5 10*3/uL (ref 0.1–1.0)
Monocytes Relative: 6 %
Neutro Abs: 5.6 10*3/uL (ref 1.7–7.7)
Neutrophils Relative %: 76 %
Platelet Count: 216 10*3/uL (ref 150–400)
RBC: 4.91 MIL/uL (ref 3.87–5.11)
RDW: 18.6 % — ABNORMAL HIGH (ref 11.5–15.5)
WBC Count: 7.5 10*3/uL (ref 4.0–10.5)
nRBC: 0.8 % — ABNORMAL HIGH (ref 0.0–0.2)

## 2022-07-28 LAB — IRON AND IRON BINDING CAPACITY (CC-WL,HP ONLY)
Iron: 90 ug/dL (ref 28–170)
Saturation Ratios: 31 % (ref 10.4–31.8)
TIBC: 288 ug/dL (ref 250–450)
UIBC: 198 ug/dL (ref 148–442)

## 2022-07-28 LAB — FERRITIN: Ferritin: 632 ng/mL — ABNORMAL HIGH (ref 11–307)

## 2022-07-28 NOTE — Progress Notes (Signed)
[facility] [phone]   [phone]  OFFICE PROGRESS NOTE  Lind Covert, MD [place] [address]  DIAGNOSIS: Thalassemia minor  PRIOR THERAPY: None  CURRENT THERAPY: Observation  INTERVAL HISTORY: Raeana Blinn 76 y.o. female returns to the clinic today for follow-up visit.  The patient is feeling fine today with no concerning complaints except for mild fatigue.  The patient denied having any current chest pain, shortness of breath, cough or hemoptysis.  She has occasional dizzy spells.  She has no nausea, vomiting, diarrhea or constipation.  She has no headache or visual changes.  She is scheduled to see Dr. Arelia Longest in November for evaluation and recommendation regarding future colonoscopy.  The patient is here today for evaluation and repeat CBC, iron study and ferritin.  MEDICAL HISTORY: Past Medical History:  Diagnosis Date   Anemia    Hx of adenomatous colonic polyps 09/18/2017   Thalassanemia     ALLERGIES:  has No Known Allergies.  MEDICATIONS:  Current Outpatient Medications  Medication Sig Dispense Refill   ALPHAGAN P 0.15 % ophthalmic solution      Calcium Carbonate-Vit D-Min (CALTRATE 600+D PLUS MINERALS) 600-800 MG-UNIT TABS Take by mouth.     Dorzolamide HCl-Timolol Mal PF 2-0.5 % SOLN Apply 1 drop to eye 2 (two) times daily.     fluticasone (CUTIVATE) 0.05 % cream Apply topically 2 (two) times daily. 30 g 2   folic acid (FOLVITE) 027 MCG tablet Take 400 mcg by mouth daily.       Garlic 2536 MG CAPS Take by mouth.     Multiple Vitamins-Minerals (CENTRUM SILVER ADULT 50+) TABS Take 1 tablet by mouth daily.     valACYclovir (VALTREX) 1000 MG tablet Take 2 tabs now and repeat in 12 hours 12 tablet 2   No current facility-administered medications for this visit.    SURGICAL HISTORY:  Past Surgical History:  Procedure Laterality Date   ABDOMINAL HYSTERECTOMY     CESAREAN SECTION      COLONOSCOPY      REVIEW OF SYSTEMS:  A comprehensive review of systems was negative except for: Constitutional: positive for fatigue Neurological: positive for dizziness   PHYSICAL EXAMINATION: General appearance: alert, cooperative, fatigued, and no distress Head: Normocephalic, without obvious abnormality, atraumatic Neck: no adenopathy, no JVD, supple, symmetrical, trachea midline, and thyroid not enlarged, symmetric, no tenderness/mass/nodules Lymph nodes: Cervical, supraclavicular, and axillary nodes normal. Resp: clear to auscultation bilaterally Back: symmetric, no curvature. ROM normal. No CVA tenderness. Cardio: regular rate and rhythm, S1, S2 normal, no murmur, click, rub or gallop GI: soft, non-tender; bowel sounds normal; no masses,  no organomegaly Extremities: extremities normal, atraumatic, no cyanosis or edema  ECOG PERFORMANCE STATUS: 1 - Symptomatic but completely ambulatory  Blood pressure (!) 144/61, pulse 61, temperature 98.1 F (36.7 C), temperature source Oral, resp. rate 15, weight 132 lb 4.8 oz (60 kg), SpO2 100 %.  LABORATORY DATA: Lab Results  Component Value Date   WBC 6.3 01/26/2022   HGB 8.8 (L) 01/26/2022   HCT 28.2 (L) 01/26/2022   MCV 57.0 (L) 01/26/2022   PLT 216 01/26/2022      Chemistry      Component Value Date/Time   NA 140 07/29/2021 0948   K 4.1 07/29/2021 0948   CL 105 07/29/2021 0948   CO2 26 07/29/2021 0948   BUN 16 07/29/2021 0948   CREATININE 0.85 07/29/2021 0948   CREATININE 0.81 12/02/2013  1034      Component Value Date/Time   CALCIUM 9.5 07/29/2021 0948   ALKPHOS 72 07/29/2021 0948   AST 14 (L) 07/29/2021 0948   ALT 12 07/29/2021 0948   BILITOT 1.8 (H) 07/29/2021 0948       RADIOGRAPHIC STUDIES: No results found.  ASSESSMENT AND PLAN: This is a very pleasant 76 years old African-American female with history of thalassemia minor as well as iron overload.  The patient is feeling fine today with no concerning  complaints even with the persistent anemia.   The patient is feeling fine today with no concerning complaints except for the mild fatigue.  She has a history of adenomatous polyp in the past and she is scheduled to see gastroenterology for evaluation. Her CBC today showed the persistent anemia with hemoglobin of 8.7 and hematocrit 28.2%.  Her MCV is 57. The patient has been with this kind of anemia for several years with no significant change.  Her previous iron studies were unremarkable except for the elevated ferritin level that likely secondary to inflammatory process. Her iron study and ferritin are still pending today. I recommended for her to continue on observation with repeat blood work in 6 months but if the iron studies showed significant deficiency, I will arrange for her to receive iron infusion in the interval. The patient was advised to call immediately if she has any other concerning symptoms in the interval. The patient voices understanding of current disease status and treatment options and is in agreement with the current care plan.  All questions were answered. The patient knows to call the clinic with any problems, questions or concerns. We can certainly see the patient much sooner if necessary.   Disclaimer: This note was dictated with voice recognition software. Similar sounding words can inadvertently be transcribed and may not be corrected upon review.

## 2022-07-29 ENCOUNTER — Ambulatory Visit: Payer: Medicare HMO | Admitting: Internal Medicine

## 2022-08-03 ENCOUNTER — Other Ambulatory Visit (HOSPITAL_BASED_OUTPATIENT_CLINIC_OR_DEPARTMENT_OTHER): Payer: Self-pay

## 2022-08-03 MED ORDER — INFLUENZA VAC A&B SA ADJ QUAD 0.5 ML IM PRSY
PREFILLED_SYRINGE | INTRAMUSCULAR | 0 refills | Status: DC
  Filled 2022-08-03: qty 0.5, 1d supply, fill #0

## 2022-08-09 ENCOUNTER — Ambulatory Visit (INDEPENDENT_AMBULATORY_CARE_PROVIDER_SITE_OTHER): Payer: Medicare HMO

## 2022-08-09 DIAGNOSIS — Z23 Encounter for immunization: Secondary | ICD-10-CM | POA: Diagnosis not present

## 2022-09-27 ENCOUNTER — Ambulatory Visit: Payer: Medicare HMO | Admitting: Internal Medicine

## 2022-10-04 ENCOUNTER — Ambulatory Visit (INDEPENDENT_AMBULATORY_CARE_PROVIDER_SITE_OTHER): Payer: Medicare HMO | Admitting: Family Medicine

## 2022-10-04 ENCOUNTER — Encounter: Payer: Self-pay | Admitting: Family Medicine

## 2022-10-04 VITALS — BP 133/67 | HR 71 | Ht 61.0 in | Wt 131.4 lb

## 2022-10-04 DIAGNOSIS — R030 Elevated blood-pressure reading, without diagnosis of hypertension: Secondary | ICD-10-CM | POA: Diagnosis not present

## 2022-10-04 DIAGNOSIS — E785 Hyperlipidemia, unspecified: Secondary | ICD-10-CM

## 2022-10-04 MED ORDER — VALACYCLOVIR HCL 1 G PO TABS: ORAL_TABLET | ORAL | 2 refills | Status: DC

## 2022-10-04 MED ORDER — FLUTICASONE PROPIONATE 0.05 % EX CREA: TOPICAL_CREAM | Freq: Two times a day (BID) | CUTANEOUS | 2 refills | Status: DC

## 2022-10-04 NOTE — Assessment & Plan Note (Signed)
Normal blood pressure today.  Continue to monitor at home

## 2022-10-04 NOTE — Progress Notes (Unsigned)
Subjective:   Hannah Landry is a 76 y.o. female who presents for Medicare Annual (Subsequent) preventive examination.  Overall feels well. Occaisional episode of lightheadness when she bends over.  No near falls and does not interfere with her activities She is very involved in her husbands pancreatic cancer treatments       Objective:     Vitals: BP 133/67   Pulse 71   Ht _0  (1.549 m)   Wt 131 lb 6.4 oz (59.6 kg)   SpO2 100%   BMI 24.83 kg/m   Body mass index is 24.83 kg/m.  Heart - Regular rate and rhythm.  No murmurs, gallops or rubs.    Lungs:  Normal respiratory effort, chest expands symmetrically. Lungs are clear to auscultation, no crackles or wheezes. Abdomen: soft and non-tender without masses, organomegaly or hernias noted.  No guarding or rebound Extremities:  No cyanosis, edema, or deformity noted with good range of motion of all major joints.   Neck:  No deformities, thyromegaly, masses, or tenderness noted.   Supple with full range of motion without pain. 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/18/2017   Thalassanemia    Past Surgical History:  Procedure Laterality Date   ABDOMINAL HYSTERECTOMY     CESAREAN SECTION     COLONOSCOPY     Family History  Problem Relation Age of Onset   Colon polyps Mother    Colon cancer Neg Hx    Esophageal cancer Neg Hx    Rectal cancer Neg Hx    Stomach cancer Neg Hx    Social History   Socioeconomic History   Marital status: Married    Spouse name: Not on file   Number of children: Not on file   Years of education: Not on file   Highest education level: Not on file  Occupational History   Not on file  Tobacco Use   Smoking status: Never    Smokeless tobacco: Never  Substance and Sexual Activity   Alcohol use: No   Drug use: No   Sexual activity: Not on file  Other Topics Concern   Not on file  Social History Narrative   Lives with husband Briahnna Harries   Retired    Family in [place]    Social Determinants of Health   Financial Resource Strain: Not on [employer] Insecurity: Not on file  Transportation Needs: Not on file  Physical Activity: Not on file  Stress: Not on file  Social Connections: Not on file  No social or financial stressors other than husbands cancer - see above  Outpatient Encounter Medications as of 10/04/2022  Medication Sig   ALPHAGAN P 0.15 % ophthalmic solution    Calcium Carbonate-Vit D-Min (CALTRATE 600+D PLUS MINERALS) 600-800 MG-UNIT TABS Take by mouth.   Dorzolamide HCl-Timolol Mal PF 2-0.5 % SOLN Apply 1 drop to eye 2 (two) times daily.   fluticasone (CUTIVATE) 0.05 % cream Apply topically 2 (two) times daily.   folic acid (FOLVITE) 568 MCG tablet Take 400 mcg by mouth daily.     Garlic 6168 MG CAPS Take by mouth.   influenza vaccine adjuvanted (FLUAD) 0.5 ML injection Inject into  the muscle.   Multiple Vitamins-Minerals (CENTRUM SILVER ADULT 50+) TABS Take 1 tablet by mouth daily.   valACYclovir (VALTREX) 1000 MG tablet Take 2 tabs now and repeat in 12 hours   [DISCONTINUED] fluticasone (CUTIVATE) 0.05 % cream Apply topically 2 (two) times daily.   [DISCONTINUED] valACYclovir (VALTREX) 1000 MG tablet Take 2 tabs now and repeat in 12 hours   No facility-administered encounter medications on file as of 10/04/2022.    Activities of Daily Living Fully completes all ADLs  Patient Care Team: Lind Covert, MD as PCP - General     Assessment:   This is a routine wellness examination for [place].  Exercise Activities and Dietary recommendations  Continue current activities   Fall Risk No fall   Depression Screen    10/04/2022    9:39 AM 10/02/2019    8:38 AM 09/25/2018     8:47 AM 09/20/2017    8:32 AM  PHQ 2/9 Scores  PHQ - 2 Score 0 0 0 0  PHQ- 9 Score 0        Cognitive Function  Intact.  Knows her medications and husbands information     Immunization History  Administered Date(s) Administered   DTaP 01/22/2004   Fluad Quad(high Dose 65+) 10/02/2019, 09/21/2021, 08/09/2022   Influenza Split 11/21/2012   Influenza,inj,Quad PF,6+ Mos 08/21/2013, 08/27/2014, 09/09/2015, 09/14/2016, 09/20/2017, 09/25/2018, 09/15/2020   PFIZER(Purple Top)SARS-COV-2 Vaccination 12/26/2019, 01/16/2020, 08/13/2020, 03/12/2021   Pneumococcal Conjugate-13 09/09/2015   Pneumococcal Polysaccharide-23 05/09/2011   Tdap 03/18/2014   Unspecified SARS-COV-2 Vaccination 12/26/2019, 01/16/2020, 09/09/2021, 08/12/2022   Zoster Recombinat (Shingrix) 11/27/2018, 05/15/2019   Zoster, Live 11/27/2008     Screening Tests Health Maintenance  Topic Date Due   DTAP VACCINES (1) 12/19/1946   COLONOSCOPY (Pts 45-43yr Insurance coverage will need to be confirmed)  09/12/2022   Medicare Annual Wellness (AWV)  09/21/2022   COVID-19 Vaccine (9 - 2023-24 season) 10/07/2022   DTaP/Tdap/Td (3 - Td or Tdap) 03/18/2024   Pneumonia Vaccine 77 Years old  Completed   INFLUENZA VACCINE  Completed   DEXA SCAN  Completed   Hepatitis C Screening  Completed   Zoster Vaccines- Shingrix  Completed   HPV VACCINES  Aged Out    Cancer Screenings: Lung: Low Dose CT Chest recommended if Age 77-80years, 20 pack-year currently smoking OR have quit w/in 15years. Patient does not qualify. Breast:  Up to date on Mammogram? Yes   Up to date of Bone Density/Dexa? Yes Colorectal: discussion with her GI doctor      Plan:   Healthy.  Continue her current activites   I have personally reviewed and noted the following in the patient's chart:   Medical and social history Use of alcohol, tobacco or illicit drugs  Current medications and supplements Functional ability and status Nutritional  status Physical activity Advanced directives List of other physicians Hospitalizations, surgeries, and ER visits in previous 12 months Vitals Screenings to include cognitive, depression, and falls Referrals and appointments ASSESSMENT/PLAN:   Elevated blood pressure reading Assessment & Plan: Normal blood pressure today.  Continue to monitor at home    Hyperlipidemia, unspecified hyperlipidemia type Assessment & Plan: Will check her labs today   Orders: -     Lipid panel -     CMP14+EGFR  Other orders -     valACYclovir HCl; Take 2 tabs now and repeat in 12 hours  Dispense: 12 tablet; Refill: 2 -     Fluticasone Propionate; Apply topically 2 (two)  times daily.  Dispense: 30 g; Refill: 2     Patient Instructions  Good to see you today - Thank you for coming in  Things we discussed today:  I will call you if your tests are not good.  Otherwise, I will send you a message on MyChart (if it is active) or a letter in the mail..  If you do not hear from me with in 2 weeks please call our office.     Colonoscopy - discuss with Dr Kennyth Arnold, MD [place], MD  10/04/2022

## 2022-10-04 NOTE — Patient Instructions (Signed)
Good to see you today - Thank you for coming in  Things we discussed today:  I will call you if your tests are not good.  Otherwise, I will send you a message on MyChart (if it is active) or a letter in the mail..  If you do not hear from me with in 2 weeks please call our office.     Colonoscopy - discuss with Dr Carlean Purl

## 2022-10-04 NOTE — Assessment & Plan Note (Signed)
Will check her labs today

## 2022-10-05 LAB — CMP14+EGFR
ALT: 10 IU/L (ref 0–32)
AST: 16 IU/L (ref 0–40)
Albumin/Globulin Ratio: 2.9 — ABNORMAL HIGH (ref 1.2–2.2)
Albumin: 4.9 g/dL — ABNORMAL HIGH (ref 3.8–4.8)
Alkaline Phosphatase: 76 IU/L (ref 44–121)
BUN/Creatinine Ratio: 18 (ref 12–28)
BUN: 14 mg/dL (ref 8–27)
Bilirubin Total: 1.3 mg/dL — ABNORMAL HIGH (ref 0.0–1.2)
CO2: 24 mmol/L (ref 20–29)
Calcium: 9.7 mg/dL (ref 8.7–10.3)
Chloride: 103 mmol/L (ref 96–106)
Creatinine, Ser: 0.77 mg/dL (ref 0.57–1.00)
Globulin, Total: 1.7 g/dL (ref 1.5–4.5)
Glucose: 92 mg/dL (ref 70–99)
Potassium: 4.4 mmol/L (ref 3.5–5.2)
Sodium: 141 mmol/L (ref 134–144)
Total Protein: 6.6 g/dL (ref 6.0–8.5)
eGFR: 80 mL/min/{1.73_m2} (ref >59)

## 2022-10-05 LAB — LIPID PANEL
Chol/HDL Ratio: 2.5 ratio (ref 0.0–4.4)
Cholesterol, Total: 201 mg/dL — ABNORMAL HIGH (ref 100–199)
HDL: 81 mg/dL (ref >39)
LDL Chol Calc (NIH): 103 mg/dL — ABNORMAL HIGH (ref 0–99)
Triglycerides: 94 mg/dL (ref 0–149)
VLDL Cholesterol Cal: 17 mg/dL (ref 5–40)

## 2022-11-01 ENCOUNTER — Ambulatory Visit: Payer: Medicare HMO | Admitting: Internal Medicine

## 2022-11-04 ENCOUNTER — Ambulatory Visit (INDEPENDENT_AMBULATORY_CARE_PROVIDER_SITE_OTHER): Payer: Medicare HMO | Admitting: Internal Medicine

## 2022-11-04 ENCOUNTER — Encounter: Payer: Self-pay | Admitting: Internal Medicine

## 2022-11-04 VITALS — BP 134/64 | HR 69 | Ht 61.0 in | Wt 132.0 lb

## 2022-11-04 DIAGNOSIS — Z86010 Personal history of colonic polyps: Secondary | ICD-10-CM

## 2022-11-04 NOTE — Progress Notes (Signed)
   Hannah Landry 76 y.o. August 03, 1946 [id number]  Assessment & Plan:   Encounter Diagnosis  Name Primary?   Hx of adenomatous colonic polyps Yes   The patient has decided not to pursue colonoscopy surveillance and I think this is a rational decision.  She will return for signs of rectal bleeding change in bowel habits other bleeding abdominal pain GI symptoms or signs.    Subjective:   Chief Complaint: History of colon polyps  HPI 76 year old African-American woman with a history of 2 diminutive adenomas in 2018.  She is here to discuss repeating a surveillance colonoscopy.  She is not having any GI complaints.  Husband is in attendance.  He is getting ready for pancreaticoduodenectomy for pancreatic cancer.  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 Earlie Server of hematology oncology. No Known Allergies Current Meds  Medication Sig   ALPHAGAN P 0.15 % ophthalmic solution    Calcium Carbonate-Vit D-Min (CALTRATE 600+D PLUS MINERALS) 600-800 MG-UNIT TABS Take by mouth.   Dorzolamide HCl-Timolol Mal PF 2-0.5 % SOLN Apply 1 drop to eye 2 (two) times daily.   fluticasone (CUTIVATE) 0.05 % cream Apply topically 2 (two) times daily.   folic acid (FOLVITE) 330 MCG tablet Take 400 mcg by mouth daily.     Garlic 0762 MG CAPS Take by mouth.   influenza vaccine adjuvanted (FLUAD) 0.5 ML injection Inject into the muscle.   Multiple Vitamins-Minerals (CENTRUM SILVER ADULT 50+) TABS Take 1 tablet by mouth daily.   valACYclovir (VALTREX) 1000 MG tablet Take 2 tabs now and repeat in 12 hours   Past Medical History:  Diagnosis Date   Anemia    Hx of adenomatous colonic polyps 09/18/2017   Thalassanemia    Past Surgical History:  Procedure Laterality Date   ABDOMINAL HYSTERECTOMY     CESAREAN SECTION     COLONOSCOPY     Social History   Social History Narrative   Lives with husband Hannah Landry   Retired    Family in [place]     family history includes Colon polyps in her mother.   Review of Systems  As per HPI Objective:   Physical Exam BP 134/64   Pulse 69   Ht '5\' 1"'$  (1.549 m)   Wt 132 lb (59.9 kg)   BMI 24.94 kg/m

## 2022-11-04 NOTE — Patient Instructions (Signed)
No more routine colonoscopy's.  Watch for bowel changes and contact us if you have any.  Due to recent changes in healthcare laws, you may see the results of your imaging and laboratory studies on MyChart before your provider has had a chance to review them.  We understand that in some cases there may be results that are confusing or concerning to you. Not all laboratory results come back in the same time frame and the provider may be waiting for multiple results in order to interpret others.  Please give [place] 48 hours in order for your provider to thoroughly review all the results before contacting the office for clarification of your results.   I appreciate the opportunity to care for you. Silvano Rusk, MD

## 2023-01-20 ENCOUNTER — Other Ambulatory Visit (HOSPITAL_BASED_OUTPATIENT_CLINIC_OR_DEPARTMENT_OTHER): Payer: Self-pay

## 2023-01-26 ENCOUNTER — Inpatient Hospital Stay: Payer: Medicare HMO

## 2023-01-26 ENCOUNTER — Inpatient Hospital Stay: Payer: Medicare HMO | Attending: Internal Medicine | Admitting: Internal Medicine

## 2023-01-26 VITALS — BP 135/69 | HR 74 | Temp 98.4°F | Resp 18 | Wt 138.5 lb

## 2023-01-26 DIAGNOSIS — G629 Polyneuropathy, unspecified: Secondary | ICD-10-CM | POA: Insufficient documentation

## 2023-01-26 DIAGNOSIS — D563 Thalassemia minor: Secondary | ICD-10-CM

## 2023-01-26 LAB — IRON AND IRON BINDING CAPACITY (CC-WL,HP ONLY)
Iron: 104 ug/dL (ref 28–170)
Saturation Ratios: 34 % — ABNORMAL HIGH (ref 10.4–31.8)
TIBC: 305 ug/dL (ref 250–450)
UIBC: 201 ug/dL (ref 148–442)

## 2023-01-26 LAB — CBC WITH DIFFERENTIAL (CANCER CENTER ONLY)
Abs Immature Granulocytes: 0.01 10*3/uL (ref 0.00–0.07)
Basophils Absolute: 0 10*3/uL (ref 0.0–0.1)
Basophils Relative: 1 %
Eosinophils Absolute: 0.1 10*3/uL (ref 0.0–0.5)
Eosinophils Relative: 1 %
HCT: 29.6 % — ABNORMAL LOW (ref 36.0–46.0)
Hemoglobin: 9.1 g/dL — ABNORMAL LOW (ref 12.0–15.0)
Immature Granulocytes: 0 %
Lymphocytes Relative: 20 %
Lymphs Abs: 1.4 10*3/uL (ref 0.7–4.0)
MCH: 18 pg — ABNORMAL LOW (ref 26.0–34.0)
MCHC: 30.7 g/dL (ref 30.0–36.0)
MCV: 58.6 fL — ABNORMAL LOW (ref 80.0–100.0)
Monocytes Absolute: 0.5 10*3/uL (ref 0.1–1.0)
Monocytes Relative: 7 %
Neutro Abs: 5 10*3/uL (ref 1.7–7.7)
Neutrophils Relative %: 71 %
Platelet Count: 247 10*3/uL (ref 150–400)
RBC: 5.05 MIL/uL (ref 3.87–5.11)
RDW: 18.7 % — ABNORMAL HIGH (ref 11.5–15.5)
WBC Count: 7 10*3/uL (ref 4.0–10.5)
nRBC: 0.9 % — ABNORMAL HIGH (ref 0.0–0.2)

## 2023-01-26 LAB — FERRITIN: Ferritin: 623 ng/mL — ABNORMAL HIGH (ref 11–307)

## 2023-01-26 NOTE — Progress Notes (Signed)
[facility] [phone]   [phone]  OFFICE PROGRESS NOTE  Lind Covert, MD [place] [address]  DIAGNOSIS: Thalassemia minor  PRIOR THERAPY: None  CURRENT THERAPY: Observation  INTERVAL HISTORY: Hannah Landry 77 y.o. female returns to the clinic today for 51-monthfollow-up visit.  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  She denied having any current chest pain, shortness of breath, cough or hemoptysis.  She has no nausea, vomiting, diarrhea or constipation.  She has no headache or visual changes.  She is here today for evaluation and repeat blood work.  She mentions that she had anemia for long time and her hemoglobin has never been above 9.5.   MEDICAL HISTORY: Past Medical History:  Diagnosis Date   Anemia    Hx of adenomatous colonic polyps 09/18/2017   Thalassanemia     ALLERGIES:  has No Known Allergies.  MEDICATIONS:  Current Outpatient Medications  Medication Sig Dispense Refill   ALPHAGAN P 0.15 % ophthalmic solution      Calcium Carbonate-Vit D-Min (CALTRATE 600+D PLUS MINERALS) 600-800 MG-UNIT TABS Take by mouth.     Dorzolamide HCl-Timolol Mal PF 2-0.5 % SOLN Apply 1 drop to eye 2 (two) times daily.     fluticasone (CUTIVATE) 0.05 % cream Apply topically 2 (two) times daily. 30 g 2   folic acid (FOLVITE) 4A999333MCG tablet Take 400 mcg by mouth daily.       Garlic 1123XX123MG CAPS Take by mouth.     influenza vaccine adjuvanted (FLUAD) 0.5 ML injection Inject into the muscle. 0.5 mL 0   Multiple Vitamins-Minerals (CENTRUM SILVER ADULT 50+) TABS Take 1 tablet by mouth daily.     valACYclovir (VALTREX) 1000 MG tablet Take 2 tabs now and repeat in 12 hours 12 tablet 2   No current facility-administered medications for this visit.    SURGICAL HISTORY:  Past Surgical History:  Procedure Laterality  Date   ABDOMINAL HYSTERECTOMY     CESAREAN SECTION     COLONOSCOPY      REVIEW OF SYSTEMS:  A comprehensive review of systems was negative except for: Constitutional: positive for fatigue Neurological: positive for paresthesia   PHYSICAL EXAMINATION: General appearance: alert, cooperative, fatigued, and no distress Head: Normocephalic, without obvious abnormality, atraumatic Neck: no adenopathy, no JVD, supple, symmetrical, trachea midline, and thyroid not enlarged, symmetric, no tenderness/mass/nodules Lymph nodes: Cervical, supraclavicular, and axillary nodes normal. Resp: clear to auscultation bilaterally Back: symmetric, no curvature. ROM normal. No CVA tenderness. Cardio: regular rate and rhythm, S1, S2 normal, no murmur, click, rub or gallop GI: soft, non-tender; bowel sounds normal; no masses,  no organomegaly Extremities: extremities normal, atraumatic, no cyanosis or edema  ECOG PERFORMANCE STATUS: 1 - Symptomatic but completely ambulatory  Blood pressure 135/69, pulse 74, temperature 98.4 F (36.9 C), temperature source Oral, resp. rate 18, weight 138 lb 8 oz (62.8 kg), SpO2 98 %.  LABORATORY DATA: Lab Results  Component Value Date   WBC 7.0 01/26/2023   HGB 9.1 (L) 01/26/2023   HCT 29.6 (L) 01/26/2023   MCV 58.6 (L) 01/26/2023   PLT 247 01/26/2023      Chemistry      Component Value Date/Time   NA 141 10/04/2022 0951   K 4.4 10/04/2022 0951   CL 103 10/04/2022 0951   CO2 24 10/04/2022 0951  BUN 14 10/04/2022 0951   CREATININE 0.77 10/04/2022 0951   CREATININE 0.85 07/29/2021 0948   CREATININE 0.81 12/02/2013 1034      Component Value Date/Time   CALCIUM 9.7 10/04/2022 0951   ALKPHOS 76 10/04/2022 0951   AST 16 10/04/2022 0951   AST 14 (L) 07/29/2021 0948   ALT 10 10/04/2022 0951   ALT 12 07/29/2021 0948   BILITOT 1.3 (H) 10/04/2022 0951   BILITOT 1.8 (H) 07/29/2021 0948       RADIOGRAPHIC STUDIES: No results found.  ASSESSMENT AND PLAN: This  is a very pleasant 77 years old African-American female with history of thalassemia minor as well as iron overload.  The patient is feeling fine today with no concerning complaints except for the mild fatigue.  She has a history of adenomatous polyp in the past  The patient is currently on observation and she is feeling well except for the mild fatigue and neuropathy. CBC today showed hemoglobin of 9.1 and hematocrit 29.6%.  Iron study, ferritin, serum copper and ceruloplasmin as well as blood for heavy metals are still pending. I recommended for the patient to continue on observation for now but I will call her with any additional recommendation based on the pending lab results. I will see her back for follow-up visit in 6 months for evaluation and repeat blood work. For the peripheral neuropathy, I advised her to add vitamin B12 supplement to her daily nutrition. She was advised to call immediately if she has any other concerning symptoms in the interval. The patient voices understanding of current disease status and treatment options and is in agreement with the current care plan.  All questions were answered. The patient knows to call the clinic with any problems, questions or concerns. We can certainly see the patient much sooner if necessary.   Disclaimer: This note was dictated with voice recognition software. Similar sounding words can inadvertently be transcribed and may not be corrected upon review.

## 2023-01-28 LAB — CERULOPLASMIN: Ceruloplasmin: 21.3 mg/dL (ref 19.0–39.0)

## 2023-01-31 LAB — COPPER, SERUM

## 2023-02-02 LAB — HEAVY METALS, BLOOD
Arsenic: 2 ug/L (ref 0–9)
Lead: <1 ug/dL (ref 0.0–3.4)
Mercury: <1 ug/L (ref 0.0–14.9)

## 2023-02-09 LAB — MISC LABCORP TEST (SEND OUT): Labcorp test code: 81041

## 2023-03-02 ENCOUNTER — Encounter: Payer: Self-pay | Admitting: Family Medicine

## 2023-03-02 ENCOUNTER — Ambulatory Visit (INDEPENDENT_AMBULATORY_CARE_PROVIDER_SITE_OTHER): Payer: Medicare HMO | Admitting: Family Medicine

## 2023-03-02 VITALS — BP 124/80 | HR 72 | Ht 61.0 in | Wt 130.6 lb

## 2023-03-02 DIAGNOSIS — L608 Other nail disorders: Secondary | ICD-10-CM | POA: Diagnosis not present

## 2023-03-02 NOTE — Assessment & Plan Note (Signed)
Linear, well demarcated hyperpigmentation of R index finger nail without nail abnormalities x couple weeks. No other associated symptoms. 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 -Refer to dermatology to r/o insidious melanoma

## 2023-03-02 NOTE — Patient Instructions (Signed)
It was wonderful to see you today! Thank you for choosing [facility].   Please bring ALL of your medications with you to every visit.   Today we talked about:  I am referring you to a dermatologist to look at the line on your nail. Our office will call you with that referral in the few days. I think it is most likely a benign (non-concerning) condition but want a second opinion.  Please follow up as needed  Call the clinic at [phone] if your symptoms worsen or you have any concerns.  Please be sure to schedule follow up at the front desk before you leave today.   Elberta Fortis, DO Family Medicine

## 2023-03-02 NOTE — Progress Notes (Signed)
    SUBJECTIVE:   CHIEF COMPLAINT / HPI:   Black streak on fingernail Patient reports she noticed a black streak on her R index finger nail a couple weeks ago. States she was seen at the dentist and they told her to get it evaluated. Has a similar black streak on L finger nail as well that has been there much longer. Denies it is getting more prominent, just noticed it one day. Denies fever, night sweats, skin rashes and other concerns skin lesions.  PERTINENT  PMH / PSH: Thalassemia  OBJECTIVE:   BP 124/80   Pulse 72   Ht  (1.549 m)   Wt 130 lb 9.6 oz (59.2 kg)   SpO2 99%   BMI 24.68 kg/m    General: NAD, pleasant, able to participate in exam Skin: Warm and dry. Hyperpigemented, well demarcated line on R index finger as shown below. No nail irregularities or deformities notes. Similar mildly hyperpigemented line on L finger nail.  Neuro: Alert, no obvious focal deficits Psych: Normal affect and mood  ASSESSMENT/PLAN:   Nail discoloration Linear, well demarcated hyperpigmentation of R index finger nail without nail abnormalities x couple weeks. No other associated symptoms. 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 -Refer to dermatology to r/o insidious melanoma   Dr. Elberta Fortis, DO [facility]

## 2023-03-10 ENCOUNTER — Encounter: Payer: Self-pay | Admitting: *Deleted

## 2023-03-28 ENCOUNTER — Encounter: Payer: Self-pay | Admitting: Family Medicine

## 2023-03-30 MED ORDER — TRAZODONE HCL 50 MG PO TABS: 25.0000 mg | ORAL_TABLET | Freq: Every evening | ORAL | 0 refills | Status: DC | PRN

## 2023-03-30 NOTE — Telephone Encounter (Signed)
Her husband died of pancreatic cancer the end of April She is not sleeping well and having mild headaches as the day progresses.  Feels tired and sometimes light headed.   No chest pain or shortness of breath or visual changes or focal weakness  Is getting out some for exercise  History of thalasemia with chronic anemia   Has appointment with me in 2 weeks Suggested she come in sooner if any worsening or new symptoms Will try trazodone for sleep related to grief She agrees to contact us with any questions

## 2023-04-08 ENCOUNTER — Emergency Department (HOSPITAL_COMMUNITY): Payer: Medicare HMO

## 2023-04-08 ENCOUNTER — Emergency Department (HOSPITAL_COMMUNITY)
Admission: EM | Admit: 2023-04-08 | Discharge: 2023-04-08 | Disposition: A | Discharge status: Home / Self Care | Payer: Medicare HMO | Attending: Emergency Medicine | Admitting: Emergency Medicine

## 2023-04-08 DIAGNOSIS — E785 Hyperlipidemia, unspecified: Secondary | ICD-10-CM | POA: Diagnosis not present

## 2023-04-08 DIAGNOSIS — R202 Paresthesia of skin: Secondary | ICD-10-CM | POA: Diagnosis not present

## 2023-04-08 DIAGNOSIS — D649 Anemia, unspecified: Secondary | ICD-10-CM | POA: Diagnosis not present

## 2023-04-08 DIAGNOSIS — R531 Weakness: Secondary | ICD-10-CM | POA: Diagnosis not present

## 2023-04-08 DIAGNOSIS — R0789 Other chest pain: Secondary | ICD-10-CM | POA: Diagnosis not present

## 2023-04-08 DIAGNOSIS — D569 Thalassemia, unspecified: Secondary | ICD-10-CM | POA: Diagnosis not present

## 2023-04-08 DIAGNOSIS — R519 Headache, unspecified: Secondary | ICD-10-CM | POA: Diagnosis present

## 2023-04-08 LAB — I-STAT CHEM 8, ED
BUN: 10 mg/dL (ref 8–23)
Calcium, Ion: 1.23 mmol/L (ref 1.15–1.40)
Chloride: 106 mmol/L (ref 98–111)
Creatinine, Ser: 0.7 mg/dL (ref 0.44–1.00)
Glucose, Bld: 98 mg/dL (ref 70–99)
HCT: 28 % — ABNORMAL LOW (ref 36.0–46.0)
Hemoglobin: 9.5 g/dL — ABNORMAL LOW (ref 12.0–15.0)
Potassium: 3.5 mmol/L (ref 3.5–5.1)
Sodium: 143 mmol/L (ref 135–145)
TCO2: 25 mmol/L (ref 22–32)

## 2023-04-08 LAB — CBC WITH DIFFERENTIAL/PLATELET
Abs Immature Granulocytes: 0.02 10*3/uL (ref 0.00–0.07)
Basophils Absolute: 0 10*3/uL (ref 0.0–0.1)
Basophils Relative: 0 %
Eosinophils Absolute: 0 10*3/uL (ref 0.0–0.5)
Eosinophils Relative: 0 %
HCT: 29 % — ABNORMAL LOW (ref 36.0–46.0)
Hemoglobin: 8.7 g/dL — ABNORMAL LOW (ref 12.0–15.0)
Immature Granulocytes: 0 %
Lymphocytes Relative: 12 %
Lymphs Abs: 0.9 10*3/uL (ref 0.7–4.0)
MCH: 17.4 pg — ABNORMAL LOW (ref 26.0–34.0)
MCHC: 30 g/dL (ref 30.0–36.0)
MCV: 58.1 fL — ABNORMAL LOW (ref 80.0–100.0)
Monocytes Absolute: 0.3 10*3/uL (ref 0.1–1.0)
Monocytes Relative: 4 %
Neutro Abs: 6.3 10*3/uL (ref 1.7–7.7)
Neutrophils Relative %: 84 %
Platelets: 220 10*3/uL (ref 150–400)
RBC: 4.99 MIL/uL (ref 3.87–5.11)
RDW: 19.1 % — ABNORMAL HIGH (ref 11.5–15.5)
WBC: 7.5 10*3/uL (ref 4.0–10.5)
nRBC: 0.3 % — ABNORMAL HIGH (ref 0.0–0.2)

## 2023-04-08 LAB — COMPREHENSIVE METABOLIC PANEL
ALT: 13 U/L (ref 0–44)
AST: 16 U/L (ref 15–41)
Albumin: 3.9 g/dL (ref 3.5–5.0)
Alkaline Phosphatase: 51 U/L (ref 38–126)
Anion gap: 10 (ref 5–15)
BUN: 10 mg/dL (ref 8–23)
CO2: 24 mmol/L (ref 22–32)
Calcium: 8.8 mg/dL — ABNORMAL LOW (ref 8.9–10.3)
Chloride: 107 mmol/L (ref 98–111)
Creatinine, Ser: 0.82 mg/dL (ref 0.44–1.00)
GFR, Estimated: >60 mL/min (ref >60)
Glucose, Bld: 97 mg/dL (ref 70–99)
Potassium: 3.5 mmol/L (ref 3.5–5.1)
Sodium: 141 mmol/L (ref 135–145)
Total Bilirubin: 1.8 mg/dL — ABNORMAL HIGH (ref 0.3–1.2)
Total Protein: 6.1 g/dL — ABNORMAL LOW (ref 6.5–8.1)

## 2023-04-08 LAB — TROPONIN I (HIGH SENSITIVITY)
Troponin I (High Sensitivity): 5 ng/L (ref <18)
Troponin I (High Sensitivity): 5 ng/L (ref <18)

## 2023-04-08 LAB — URINALYSIS, ROUTINE W REFLEX MICROSCOPIC
Bacteria, UA: NONE SEEN
Bilirubin Urine: NEGATIVE
Glucose, UA: NEGATIVE mg/dL
Hgb urine dipstick: NEGATIVE
Ketones, ur: NEGATIVE mg/dL
Nitrite: NEGATIVE
Protein, ur: NEGATIVE mg/dL
Specific Gravity, Urine: 1.01 (ref 1.005–1.030)
pH: 6 (ref 5.0–8.0)

## 2023-04-08 LAB — LIPASE, BLOOD: Lipase: 29 U/L (ref 11–51)

## 2023-04-08 LAB — BRAIN NATRIURETIC PEPTIDE: B Natriuretic Peptide: 82.5 pg/mL (ref 0.0–100.0)

## 2023-04-08 LAB — CK: Total CK: 61 U/L (ref 38–234)

## 2023-04-08 LAB — MAGNESIUM: Magnesium: 2 mg/dL (ref 1.7–2.4)

## 2023-04-08 MED ORDER — DIPHENHYDRAMINE HCL 50 MG/ML IJ SOLN
12.5000 mg | Freq: Once | INTRAMUSCULAR | Status: AC
  Administered 2023-04-08: 12.5 mg via INTRAVENOUS
  Filled 2023-04-08: qty 1

## 2023-04-08 MED ORDER — LACTATED RINGERS IV BOLUS
500.0000 mL | Freq: Once | INTRAVENOUS | Status: AC
  Administered 2023-04-08: 500 mL via INTRAVENOUS

## 2023-04-08 MED ORDER — IOHEXOL 350 MG/ML SOLN
75.0000 mL | Freq: Once | INTRAVENOUS | Status: AC | PRN
  Administered 2023-04-08: 75 mL via INTRAVENOUS

## 2023-04-08 MED ORDER — METOCLOPRAMIDE HCL 5 MG/ML IJ SOLN
10.0000 mg | Freq: Once | INTRAMUSCULAR | Status: AC
  Administered 2023-04-08: 10 mg via INTRAVENOUS
  Filled 2023-04-08: qty 2

## 2023-04-08 MED ORDER — ASPIRIN 81 MG PO CHEW
324.0000 mg | CHEWABLE_TABLET | Freq: Once | ORAL | Status: AC
  Administered 2023-04-08: 324 mg via ORAL
  Filled 2023-04-08: qty 4

## 2023-04-08 NOTE — ED Provider Notes (Signed)
Patient signed out to me at 1500 by Dr. Durwin Nora with pending MRI.  In short this is a 77 year old female with a past medical history of anemia, thalassemia and hyperlipidemia that presented to the emergency department with headaches and lower extremity weakness.  Patient initially had labs performed that showed no acute abnormalities, anemia is at baseline, CT head without acute disease.  Patient is at MRI at time of signout.  Clinical Course as of 04/08/23 1845  Sat Apr 08, 2023  1833 No evidence of acute abnormality on CT or MRI. She is stable for discharge home with outpatient follow up. [VK]    Clinical Course User Index [VK] Rexford Maus, DO      Rexford Maus, [place] 04/08/23 [phone]

## 2023-04-08 NOTE — ED Triage Notes (Signed)
Pt states that last night at 1900 she got a sudden onset of headache in the L posterior region. Pt denies vision changes. Endorses bilateral leg weakness. Pt also c/o chest pressure radiating to her L arm. No blood thinners per pt.

## 2023-04-08 NOTE — ED Provider Notes (Signed)
[place] [facility]MENT AT [facility] Provider Note   CSN: [id number] Arrival date & time: 04/08/23  4098     History  Chief Complaint  Patient presents with   Headache   Chest Pain    Hannah Landry is a 77 y.o. female.  HPI Patient presents for multiple complaints.  Medical history includes HLD, anemia, thalassemia.  She currently does not take any home medications.  Starting last night, she had a left parietal headache.  This has persisted today.  She has had similar headaches in the past.  She has chronic weakness which she attributes to her anemia.  Since yesterday, she has felt increased weakness in both lower extremities.  She describes paresthesias in lower extremities as well and bilateral fingers.  When she woke this morning, she had a central chest heaviness.  She also describes a heaviness sensation to her left arm.  Currently, this heaviness sensation has resolved.    Home Medications Prior to Admission medications   Medication Sig Start Date End Date Taking? Authorizing Provider  ALPHAGAN P 0.15 % ophthalmic solution  12/28/21   [provider]  Calcium Carbonate-Vit D-Min (CALTRATE 600+D PLUS MINERALS) 600-800 MG-UNIT TABS Take by mouth.    [provider]  Dorzolamide HCl-Timolol Mal PF 2-0.5 % SOLN Apply 1 drop to eye 2 (two) times daily. 10/19/21   [provider]  fluticasone (CUTIVATE) 0.05 % cream Apply topically 2 (two) times daily. 10/04/22   Carney Living, MD  folic acid (FOLVITE) 400 MCG tablet Take 400 mcg by mouth daily.      [provider]  Garlic 1000 MG CAPS Take by mouth.    [provider]  Multiple Vitamins-Minerals (CENTRUM SILVER ADULT 50+) TABS Take 1 tablet by mouth daily. 12/16/99   [provider]  traZODone (DESYREL) 50 MG tablet Take 0.5-1 tablets (25-50 mg total) by mouth at bedtime as needed for sleep. 03/30/23   Carney Living, MD  valACYclovir (VALTREX) 1000  MG tablet Take 2 tabs now and repeat in 12 hours 10/04/22   Carney Living, MD      Allergies    Patient has no known allergies.    Review of Systems   Review of Systems  Cardiovascular:  Positive for chest pain.  Neurological:  Positive for weakness and headaches.  All other systems reviewed and are negative.   Physical Exam Updated Vital Signs BP (!) 151/85   Pulse 72   Temp 98.4 F (36.9 C) (Oral)   Resp 17   SpO2 100%  Physical Exam Vitals and nursing note reviewed.  Constitutional:      General: She is not in acute distress.    Appearance: She is well-developed. She is not ill-appearing, toxic-appearing or diaphoretic.  HENT:     Head: Normocephalic and atraumatic.     Mouth/Throat:     Mouth: Mucous membranes are moist.  Eyes:     Conjunctiva/sclera: Conjunctivae normal.  Cardiovascular:     Rate and Rhythm: Normal rate and regular rhythm.  Pulmonary:     Effort: Pulmonary effort is normal. No respiratory distress.     Breath sounds: Normal breath sounds. No wheezing or rales.  Abdominal:     Palpations: Abdomen is soft.     Tenderness: There is no abdominal tenderness.  Musculoskeletal:        General: No swelling.     Cervical back: Normal range of motion and neck supple.  Skin:  General: Skin is warm and dry.  Neurological:     Mental Status: She is alert and oriented to person, place, and time.     Cranial Nerves: No cranial nerve deficit, dysarthria or facial asymmetry.     Sensory: No sensory deficit.     Motor: No weakness.     Coordination: Coordination normal.  Psychiatric:        Mood and Affect: Mood normal.        Speech: Speech normal.        Behavior: Behavior normal.     ED Results / Procedures / Treatments   Labs (all labs ordered are listed, but only abnormal results are displayed) Labs Reviewed  COMPREHENSIVE METABOLIC PANEL - Abnormal; Notable for the following components:      Result Value   Calcium 8.8 (*)    Total  Protein 6.1 (*)    Total Bilirubin 1.8 (*)    All other components within normal limits  CBC WITH DIFFERENTIAL/PLATELET - Abnormal; Notable for the following components:   Hemoglobin 8.7 (*)    HCT 29.0 (*)    MCV 58.1 (*)    MCH 17.4 (*)    RDW 19.1 (*)    nRBC 0.3 (*)    All other components within normal limits  URINALYSIS, ROUTINE W REFLEX MICROSCOPIC - Abnormal; Notable for the following components:   Leukocytes,Ua LARGE (*)    All other components within normal limits  I-STAT CHEM 8, ED - Abnormal; Notable for the following components:   Hemoglobin 9.5 (*)    HCT 28.0 (*)    All other components within normal limits  MAGNESIUM  LIPASE, BLOOD  BRAIN NATRIURETIC PEPTIDE  CK  TROPONIN I (HIGH SENSITIVITY)  TROPONIN I (HIGH SENSITIVITY)    EKG EKG Interpretation  Date/Time:  Saturday Apr 08 2023 09:04:42 EDT Ventricular Rate:  80 PR Interval:  145 QRS Duration: 81 QT Interval:  369 QTC Calculation: 426 R Axis:   25 Text Interpretation: Sinus rhythm Probable left atrial enlargement Abnormal R-wave progression, early transition Confirmed by Gloris Manchester ([id number]) on 04/08/2023 9:45:30 AM  Radiology CT HEAD WO CONTRAST  Result Date: 04/08/2023 CLINICAL DATA:  Headache, new onset. EXAM: CT HEAD WITHOUT CONTRAST TECHNIQUE: Contiguous axial images were obtained from the base of the skull through the vertex without intravenous contrast. RADIATION DOSE REDUCTION: This exam was performed according to the departmental dose-optimization program which includes automated exposure control, adjustment of the mA and/or kV according to patient size and/or use of iterative reconstruction technique. COMPARISON:  None Available. FINDINGS: Brain: No evidence of acute infarction, hemorrhage, hydrocephalus, extra-axial collection or mass lesion/mass effect. Vascular: No hyperdense vessel or unexpected calcification. Skull: Heterogeneous calvarium without focal lesion, likely related to history of  thalassemia. Negative for fracture. Sinuses/Orbits: Negative IMPRESSION: No acute finding or explanation for headache. Electronically Signed   By: Tiburcio Pea M.D.   On: 04/08/2023 11:04   DG Chest Portable 1 View  Result Date: 04/08/2023 CLINICAL DATA:  Sudden onset of headache and left posterior region. Chest pressure that radiates to left arm. EXAM: PORTABLE CHEST 1 VIEW COMPARISON:  None Available. FINDINGS: Heart size and mediastinal contours appear normal. Chronic coarsened interstitial markings. No pleural fluid, interstitial edema or airspace disease. The visualized osseous structures are unremarkable. IMPRESSION: 1. No acute findings. 2. Chronic interstitial coarsening. Electronically Signed   By: Signa Kell M.D.   On: 04/08/2023 09:49    Procedures Procedures    Medications Ordered in ED  Medications  aspirin chewable tablet 324 mg (324 mg Oral Given 04/08/23 1113)  lactated ringers bolus 500 mL (500 mLs Intravenous New Bag/Given 04/08/23 1205)  metoCLOPramide (REGLAN) injection 10 mg (10 mg Intravenous Given 04/08/23 1205)  diphenhydrAMINE (BENADRYL) injection 12.5 mg (12.5 mg Intravenous Given 04/08/23 1204)    ED Course/ Medical Decision Making/ A&P                             Medical Decision Making Amount and/or Complexity of Data Reviewed Labs: ordered. Radiology: ordered.  Risk OTC drugs. Prescription drug management.   This patient presents to the ED for concern of multiple complaints, this involves an extensive number of treatment options, and is a complaint that carries with it a high risk of complications and morbidity.  The differential diagnosis includes ACS, infection, metabolic derangements, deconditioning, CVA, ICH   Co morbidities that complicate the patient evaluation  HLD, anemia, thalassemia   Additional history obtained:  Additional history obtained from N/A External records from outside source obtained and reviewed including EMR   Lab  Tests:  I Ordered, and personally interpreted labs.  The pertinent results include: Baseline anemia, no leukocytosis, normal electrolytes, normal troponin, normal BNP   Imaging Studies ordered:  I ordered imaging studies including chest x-ray, CT head, MRI brain, CTA chest I independently visualized and interpreted imaging which showed no acute findings on chest x-ray or CT head.  Remaining imaging pending at time of signout I agree with the radiologist interpretation   Cardiac Monitoring: / EKG:  The patient was maintained on a cardiac monitor.  I personally viewed and interpreted the cardiac monitored which showed an underlying rhythm of: Sinus rhythm  Problem List / ED Course / Critical interventions / Medication management  Patient presents for multiple complaints.  Among these are left-sided headache, bilateral leg weakness, acute on chronic imbalance, and transient episode of chest discomfort.  EKG does not show any concerning ST segment changes.  Patient was given 324 of ASA.  Reglan and Benadryl were ordered for treatment of her headache and workup was initiated.  Laboratory workup is unremarkable.  CT of head did not show acute findings.  On reassessment, patient reports resolution of her headache.  She was able to ambulate to the bathroom unassisted, with steady gait.  At time of signout, MRI brain and CTA of chest were pending.  Care of patient was signed out to oncoming ED provider. I ordered medication including ASA for prior chest discomfort; IVF, Reglan and Benadryl for headache Reevaluation of the patient after these medicines showed that the patient resolved I have reviewed the patients home medicines and have made adjustments as needed   Social Determinants of Health:  Has PCP        Final Clinical Impression(s) / ED Diagnoses Final diagnoses:  Chest pressure  Left-sided headache    Rx / DC Orders ED Discharge Orders          Ordered    Ambulatory  referral to Cardiology       Comments: If you have not heard from the Cardiology office within the next 72 hours please call [phone].   04/08/23 1545              Gloris Manchester, MD 04/08/23 1545

## 2023-04-08 NOTE — Discharge Instructions (Signed)
You were seen in the emergency department for your headache and your chest pain.  Your workup showed no signs of stroke or abnormality within your brain causing your headaches and your workup showed no signs of heart attack or blood clots in your lungs.  It is unclear what is causing your symptoms at this time but you can continue to take Tylenol and Motrin as needed for your pain.  You should follow-up with your primary and we have given you a referral for cardiology to follow up with as well for your chest pain. You should return to the emergency department for significantly worsening pain, repetitive vomiting, severe shortness of breath, if you pass out, if you have numbness or weakness on one side of the body compared to the other or if you have any other new or concerning symptoms.

## 2023-04-12 ENCOUNTER — Other Ambulatory Visit: Payer: Self-pay

## 2023-04-12 ENCOUNTER — Ambulatory Visit (INDEPENDENT_AMBULATORY_CARE_PROVIDER_SITE_OTHER): Payer: Medicare HMO | Admitting: Family Medicine

## 2023-04-12 ENCOUNTER — Encounter: Payer: Self-pay | Admitting: Family Medicine

## 2023-04-12 VITALS — BP 166/64 | HR 80 | Ht 61.0 in | Wt 130.6 lb

## 2023-04-12 DIAGNOSIS — G4700 Insomnia, unspecified: Secondary | ICD-10-CM | POA: Diagnosis not present

## 2023-04-12 DIAGNOSIS — R202 Paresthesia of skin: Secondary | ICD-10-CM

## 2023-04-12 MED ORDER — TRAZODONE HCL 50 MG PO TABS: 25.0000 mg | ORAL_TABLET | Freq: Every evening | ORAL | 1 refills | Status: DC | PRN

## 2023-04-12 NOTE — Patient Instructions (Signed)
Good to see you today - Thank you for coming in  Things we discussed today:  If if any rash or loss of strength or sores or one side symptoms let me know  - Walk every day for 20-30 minutes - Increase the trazadone to one tab 30 minutes before bed - contact grief counseling  Come back to see me in 2 weeks

## 2023-04-12 NOTE — Assessment & Plan Note (Signed)
Her description of weakness seems to be more abnormal sensations suggestive of paresthesias. She has normal muscle strength and neurologic exam.  Recent extensive work up in ER unrevealing.  No signs of CNS disorders or inflammatory immune conditions or medication metal exposure.  Possibly associated with insomnia and grief reaction with mild chronic hyperventilation.  Will address these and monitor for changes.  Consider TSH, heavy metals and NCS if worsennig.  Had normal B12, copper in 2023.

## 2023-04-12 NOTE — Assessment & Plan Note (Addendum)
Very likely due to grief.  Recommend as needed trazodone and exercise

## 2023-04-12 NOTE — Progress Notes (Signed)
    SUBJECTIVE:   CHIEF COMPLAINT / HPI:   Headache Weakness Over the last few weeks gradual onset of frequent mild headaches that respond to tylenol and a feeling of "weakness" in lower extremity and some in her hands.  No laterality and no loss of strength.  A vague tingling feeling in lower legs and hands.   No chest pain or shortness of breath or vision loss or vomiting Seen in ER 5/28 had chest x-ray, CT head, MRI brain, CTA chest ECG and labs all unremarkable  Husband died of pancreatic cancer on 04/04/23 Not sleeping well.  Taking 1/2 of trazodone at night as needed  Not exercising but active taking care of home and business  OBJECTIVE:   BP (!) 166/64   Pulse 80   Ht 5\' 1"  (1.549 m)   Wt 130 lb 9.6 oz (59.2 kg)   SpO2 99%   BMI 24.68 kg/m   Fatigued appearing Heart - Regular rate and rhythm.  No murmurs, gallops or rubs.    Lungs:  Normal respiratory effort, chest expands symmetrically. Lungs are clear to auscultation, no crackles or wheezes. Neurologic exam : Cn 2-7 intact Strength equal & normal in upper & lower extremities Able to walk on heels and toes.  Able to do 5 deep knee bends without problems Balance normal  Sensation to light touch monofilament equal bilateral feet Normal pulses  Psych:  Cognition and judgment appear intact. Alert, communicative  and cooperative with normal attention span and concentration. No apparent delusions, illusions, hallucinations   ASSESSMENT/PLAN:   Paresthesias Assessment & Plan: Her description of weakness seems to be more abnormal sensations suggestive of paresthesias. She has normal muscle strength and neurologic exam.  Recent extensive work up in ER unrevealing.  No signs of CNS disorders or inflammatory immune conditions or medication metal exposure.  Possibly associated with insomnia and grief reaction with mild chronic hyperventilation.  Will address these and monitor for changes.  Consider TSH, heavy metals and NCS if  worsennig.  Had normal B12, copper in 2023.    Insomnia, unspecified type Assessment & Plan: Very likely due to grief.  Recommend as needed trazodone and exercise       Patient Instructions  Good to see you today - Thank you for coming in  Things we discussed today:  If if any rash or loss of strength or sores or one side symptoms let me know  - Walk every day for 20-30 minutes - Increase the trazadone to one tab 30 minutes before bed - contact grief counseling  Come back to see me in 2 weeks   Carney Living, MD [facility]

## 2023-04-17 ENCOUNTER — Encounter: Payer: Self-pay | Admitting: Family Medicine

## 2023-04-17 DIAGNOSIS — R202 Paresthesia of skin: Secondary | ICD-10-CM

## 2023-04-20 ENCOUNTER — Telehealth: Payer: Self-pay | Admitting: *Deleted

## 2023-04-20 ENCOUNTER — Encounter: Payer: Self-pay | Admitting: Family Medicine

## 2023-04-20 ENCOUNTER — Encounter: Payer: Self-pay | Admitting: Neurology

## 2023-04-20 DIAGNOSIS — R202 Paresthesia of skin: Secondary | ICD-10-CM

## 2023-04-20 NOTE — Telephone Encounter (Signed)
Patient is wanting to be referred to another neurology office to see what their appointment availability is.  New referral placed for [facility].  Tarvares Lant,CMA

## 2023-04-21 ENCOUNTER — Ambulatory Visit: Payer: Medicare HMO | Admitting: Neurology

## 2023-04-21 ENCOUNTER — Encounter: Payer: Self-pay | Admitting: Neurology

## 2023-04-21 ENCOUNTER — Other Ambulatory Visit (INDEPENDENT_AMBULATORY_CARE_PROVIDER_SITE_OTHER): Payer: Medicare HMO

## 2023-04-21 VITALS — BP 140/82 | HR 88 | Ht 61.0 in | Wt 131.0 lb

## 2023-04-21 DIAGNOSIS — G609 Hereditary and idiopathic neuropathy, unspecified: Secondary | ICD-10-CM

## 2023-04-21 DIAGNOSIS — G44229 Chronic tension-type headache, not intractable: Secondary | ICD-10-CM

## 2023-04-21 LAB — TSH: TSH: 0.69 u[IU]/mL (ref 0.35–5.50)

## 2023-04-21 LAB — VITAMIN B12: Vitamin B-12: 1156 pg/mL — ABNORMAL HIGH (ref 211–911)

## 2023-04-21 MED ORDER — GABAPENTIN 100 MG PO CAPS
100.0000 mg | ORAL_CAPSULE | Freq: Every day | ORAL | 5 refills | Status: DC
Start: 2023-04-21 — End: 2023-06-12

## 2023-04-21 NOTE — Patient Instructions (Signed)
For headaches, start gabapentin 100mg  at bedtime.  If no improvement in 4 weeks, contact me and we can increase dose. Limit use of pain relievers (Tylenol) to no more than 2 days out of week to prevent risk of rebound or medication-overuse headache. Check B12, TSH, ANA with ENA panel, IFE, ACE Continue walking Follow up 6 months.

## 2023-04-21 NOTE — Progress Notes (Signed)
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  Increase dose in 4 weeks if needed Limit use of pain relievers (Tylenol) to no more than 2 days out of week to prevent risk of rebound or medication-overuse headache. To evaluate for causes of neuropathy, check B12, TSH, ANA with ENA panel, IFE, ACE Encouraged routine walking/activity Follow up 6 months.   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  History supplemented by ED and referring provider's notes.  CT/MRI of brain personally reviewed.  Patient has had headaches off and on for about a year.  Her husband passed away on 07-Apr-2023.  She has been experiencing grief and poor sleep.  She started having worsening headaches,She also has chronic weakness attributed to her anemia but felt worsening weakness in the legs.  She has had tingling in her fingers and toes for about a year but started noticing involvement of her legs as well.  No radicular pain in the legs.  She feels off balance on her feet.  She takes oral B12 supplement.  Copper, heavy metal screen and CK have been unremarkable.  She presented to the ED on 5/25.for a particularly severe headache but also for new left arm heaviness and chest pressure and back pain.  EKG and labs were negative for acute coronary event.  CTA chest negative for PE.  CT head negative for acute intracranial abnormalities.  MRI of brain without contrast was also unremarkable, revealing age-related mild generalized cerebral atrophy and few punctate T2/FLAIR hyperintense foci in the cerebral white  matter.  She was given ASA for chest pressure and Reglan and Benadryl for headache.  She continues to have daily headaches but no longer severe.  They occur after getting up in the morning. She takes Tylenol daily, which helps.  It is a throbbing pain at her crown or top of the head.  No associated nausea, vomiting, photophobia, phonophobia, visual disturbance.      PAST MEDICAL HISTORY: Past Medical History:  Diagnosis Date   Anemia    Hx of adenomatous colonic polyps 09/18/2017   Thalassanemia     PAST SURGICAL HISTORY: Past Surgical History:  Procedure Laterality Date   ABDOMINAL HYSTERECTOMY     CESAREAN SECTION     COLONOSCOPY      MEDICATIONS: Current Outpatient Medications on File Prior to Visit  Medication Sig Dispense Refill   acetaminophen (TYLENOL) 325 MG tablet Take 650 mg by mouth every 6 (six) hours as needed.     ALPHAGAN P 0.15 % ophthalmic solution      Calcium Carbonate-Vit D-Min (CALTRATE 600+D PLUS MINERALS) 600-800 MG-UNIT TABS Take by mouth.     Dorzolamide HCl-Timolol Mal PF 2-0.5 % SOLN Apply 1 drop to eye 2 (two) times daily.     fluticasone (CUTIVATE) 0.05 % cream Apply topically 2 (two) times daily. 30 g 2   folic acid (FOLVITE) 400 MCG tablet Take 400 mcg by mouth daily.       Garlic 1000 MG CAPS Take by mouth.     Multiple Vitamins-Minerals (CENTRUM SILVER ADULT 50+) TABS Take 1 tablet by mouth daily.     traZODone (DESYREL) 50 MG tablet Take  0.5-1 tablets (25-50 mg total) by mouth at bedtime as needed for sleep. 30 tablet 1   valACYclovir (VALTREX) 1000 MG tablet Take 2 tabs now and repeat in 12 hours 12 tablet 2   No current facility-administered medications on file prior to visit.    ALLERGIES: No Known Allergies  FAMILY HISTORY: Family History  Problem Relation Age of Onset   Colon polyps Mother    Colon cancer Neg Hx    Esophageal cancer Neg Hx    Rectal cancer Neg Hx    Stomach cancer Neg Hx     Objective:  Blood pressure (!)  140/82, pulse 88, height 5\' 1"  (1.549 m), weight 131 lb (59.4 kg), SpO2 98 %. General: No acute distress.  Patient appears well-groomed.   Head:  Normocephalic/atraumatic Eyes:  fundi examined but not visualized Neck: supple, no paraspinal tenderness, full range of motion Back: No paraspinal tenderness Heart: regular rate and rhythm Lungs: Clear to auscultation bilaterally. Vascular: No carotid bruits. Neurological Exam: Mental status: alert and oriented to person, place, and time, speech fluent and not dysarthric, language intact. Cranial nerves: CN I: not tested CN II: pupils equal, round and reactive to light, visual fields intact CN III, IV, VI:  full range of motion, no nystagmus, no ptosis CN V: facial sensation intact. CN VII: upper and lower face symmetric CN VIII: hearing intact CN IX, X: gag intact, uvula midline CN XI: sternocleidomastoid and trapezius muscles intact CN XII: tongue midline Bulk & Tone: normal, no fasciculations. Motor:  muscle strength 5/5 throughout Sensation:  Pinprick, temperature and vibratory sensation intact. Deep Tendon Reflexes:  2+ throughout,  toes downgoing.   Finger to nose testing:  Without dysmetria.   Heel to shin:  Without dysmetria.   Gait:  Normal station and stride.  Romberg negative.    Thank you for allowing me to take part in the care of this patient.  Shon Millet, DO  CC: Pearlean Brownie, MD

## 2023-04-22 LAB — ANA+ENA+DNA/DS+SCL 70+SJOSSA/B

## 2023-04-24 LAB — ANA+ENA+DNA/DS+SCL 70+SJOSSA/B: ENA SM Ab Ser-aCnc: <0.2 AI (ref 0.0–0.9)

## 2023-04-24 LAB — ANGIOTENSIN CONVERTING ENZYME: Angiotensin-Converting Enzyme: 29 U/L (ref 9–67)

## 2023-04-25 ENCOUNTER — Ambulatory Visit (INDEPENDENT_AMBULATORY_CARE_PROVIDER_SITE_OTHER): Payer: Medicare HMO | Admitting: Family Medicine

## 2023-04-25 ENCOUNTER — Other Ambulatory Visit: Payer: Self-pay

## 2023-04-25 ENCOUNTER — Encounter: Payer: Self-pay | Admitting: Family Medicine

## 2023-04-25 VITALS — BP 144/57 | HR 79 | Ht 61.0 in | Wt 131.0 lb

## 2023-04-25 DIAGNOSIS — R202 Paresthesia of skin: Secondary | ICD-10-CM

## 2023-04-25 DIAGNOSIS — G4700 Insomnia, unspecified: Secondary | ICD-10-CM | POA: Diagnosis not present

## 2023-04-25 NOTE — Progress Notes (Signed)
    SUBJECTIVE:   CHIEF COMPLAINT / HPI:   Leg Discomfort Dr Everlena Cooper ordered neuropathy labs.  These appear normal except for an elevated B12.   She has been taking gabapentin 100 mg at night,  She feels overall her symptoms are better.  Insomnia Grief  Frequent night time awakening.  Does not nap.  Gabapentin makes her sleepy.  Is seeing hospice grief counseling for first time today.   She feels she is doing ok   Headache Saw Dr Everlena Cooper.  Started Gabapentin and decreased tylenol.  Has focal intermittent scalp pain.  Is using tylenol less.  No balance or vision changes or any nausea and vomiting    OBJECTIVE:   BP (!) 144/57   Pulse 79   Ht 5\' 1"  (1.549 m)   Wt 131 lb (59.4 kg)   SpO2 98%   BMI 24.75 kg/m   Neurologic exam : Cn 2-7 intact Strength equal & normal in upper & lower extremities Able to walk on heels and toes.  Do several deep knee bends Balance normal     ASSESSMENT/PLAN:   Paresthesias Assessment & Plan: Unsure of cause but seem to be gradually improving.  Continue gabapentin.  Suggested increased exercise.   Insomnia, unspecified type Assessment & Plan: Stable.  Suggest scheduled bed and awakening time and avoid naps.   Has stopped trazadone and is taking gabapentin 100 mg at night for her parathesias/neuropathy  She is starting to see Hospice grief counseling       Patient Instructions  Good to see you today - Thank you for coming in  Things we discussed today:  - try bedtime at 10 and get up at 5 - consider resistant exercise as well as walking - gabapentin at 930 PM - contact Dr Lendon Collar about the B12  Let me know if anything is getting worse  Please always bring your medication bottles  Come back to see me in 3 months    Carney Living, MD [facility]

## 2023-04-25 NOTE — Assessment & Plan Note (Addendum)
Stable.  Suggest scheduled bed and awakening time and avoid naps.   Has stopped trazadone and is taking gabapentin 100 mg at night for her parathesias/neuropathy  She is starting to see Hospice grief counseling

## 2023-04-25 NOTE — Assessment & Plan Note (Signed)
Unsure of cause but seem to be gradually improving.  Continue gabapentin.  Suggested increased exercise.

## 2023-04-25 NOTE — Patient Instructions (Signed)
Good to see you today - Thank you for coming in  Things we discussed today:  - try bedtime at 10 and get up at 5 - consider resistant exercise as well as walking - gabapentin at 930 PM - contact Dr Lendon Collar about the B12  Let me know if anything is getting worse  Please always bring your medication bottles  Come back to see me in 3 months

## 2023-04-26 ENCOUNTER — Encounter: Payer: Self-pay | Admitting: Internal Medicine

## 2023-04-26 LAB — ANA+ENA+DNA/DS+SCL 70+SJOSSA/B: ENA SSA (RO) Ab: <0.2 AI (ref 0.0–0.9)

## 2023-04-28 LAB — IMMUNOFIXATION, SERUM
IgA/Immunoglobulin A, Serum: 162 mg/dL (ref 64–422)
IgG (Immunoglobin G), Serum: 759 mg/dL (ref 586–1602)
IgM (Immunoglobulin M), Srm: 119 mg/dL (ref 26–217)

## 2023-04-28 LAB — ANA+ENA+DNA/DS+SCL 70+SJOSSA/B
ENA RNP Ab: <0.2 AI (ref 0.0–0.9)
Scleroderma (Scl-70) (ENA) Antibody, IgG: 0.4 AI (ref 0.0–0.9)
dsDNA Ab: <1 IU/mL (ref 0–9)

## 2023-05-01 NOTE — Progress Notes (Signed)
Patient advised to stop the B12.

## 2023-05-01 NOTE — Progress Notes (Signed)
Patient advised of her Labs. Patient to make sure if her B12 is so high should she continue taking 5000 units or decrease?

## 2023-05-12 ENCOUNTER — Encounter: Payer: Self-pay | Admitting: Internal Medicine

## 2023-05-12 ENCOUNTER — Ambulatory Visit: Payer: Medicare HMO | Attending: Internal Medicine | Admitting: Internal Medicine

## 2023-05-12 VITALS — BP 136/62 | HR 76 | Ht 61.0 in | Wt 131.2 lb

## 2023-05-12 DIAGNOSIS — R079 Chest pain, unspecified: Secondary | ICD-10-CM

## 2023-05-12 NOTE — Progress Notes (Signed)
Cardiology Office Note:    Date:  05/12/2023   ID:  Hannah Landry, DOB [birth year], MRN [id number]  PCP:  Hannah Living, MD   [facility] Providers Cardiologist:  None     Referring MD: Hannah Landry, *   No chief complaint on file. CP  History of Present Illness:    Hannah Landry is a 77 y.o. female with a hx of HLD, referral for CP. Her husband recently passed of CA in April 2024. She has had a constellation of symptoms. SThere is no note of CP prior. A referral was sent from the ED when she came in for HA and also described chest heaviness. She also had back pain. Her troponin was negative. EKG was unremarkable. Today, she describes her headaches as the most bothersome symptom.  Otherwise she denies CP with activity. She walks.. She denies DOE. She has no hx of cardiac dx.   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     ALPHAGAN P 0.15 % ophthalmic solution      Calcium Carbonate-Vit D-Min (CALTRATE 600+D PLUS MINERALS) 600-800 MG-UNIT TABS Take by mouth.     Dorzolamide HCl-Timolol Mal PF 2-0.5 % SOLN Apply 1 drop to eye 2 (two) times daily.     fluticasone (CUTIVATE) 0.05 % cream Apply topically 2 (two) times daily. 30 g 2   folic acid (FOLVITE) 400 MCG tablet Take 400 mcg by mouth daily.       gabapentin (NEURONTIN) 100 MG capsule Take 1 capsule (100 mg total) by mouth at bedtime. 30 capsule 5   Garlic 1000 MG CAPS Take by mouth.     Multiple Vitamins-Minerals (CENTRUM SILVER ADULT 50+) TABS Take 1 tablet by mouth daily.     valACYclovir (VALTREX) 1000 MG tablet  Take 2 tabs now and repeat in 12 hours 12 tablet 2   No current facility-administered medications on file prior to visit.    Allergies:   Patient has no known allergies.   Social History   Socioeconomic History   Marital status: Married    Spouse name: Not on file   Number of children: Not on file   Years of education: Not on file   Highest education level: Bachelor's degree (e.g., BA, AB, BS)  Occupational History   Not on file  Tobacco Use   Smoking status: Never   Smokeless tobacco: Never  Substance and Sexual Activity   Alcohol use: No   Drug use: No   Sexual activity: Not on file  Other Topics Concern   Not on file  Social History Narrative   Lives with husband Hannah Landry   Retired    Family in [place]    Social Determinants of Health   Financial Resource Strain: Low Risk  (03/01/2023)   Overall Financial Resource Strain (CARDIA)    Difficulty of Paying Landry Expenses: Not hard at all  Food Insecurity: No Food Insecurity (03/01/2023)   Hunger Vital Sign    Worried About Running Out of Food in the Last Year: Never  true    Ran Out of Food in the Last Year: Never true  Transportation Needs: No Transportation Needs (03/01/2023)   PRAPARE - Administrator, Civil Service (Medical): No    Lack of Transportation (Non-Medical): No  Physical Activity: Unknown (03/01/2023)   Exercise Vital Sign    Days of Exercise per Week: 0 days    Minutes of Exercise per Session: Not on file  Stress: Stress Concern Present (03/01/2023)   [employer] of Occupational Health - Occupational Stress Questionnaire    Feeling of Stress : To some extent  Social Connections: Unknown (03/01/2023)   Social Connection and Isolation Panel [NHANES]    Frequency of Communication with Friends and Family: More than three times a week    Frequency of Social Gatherings with Friends and Family: Patient declined    Attends Religious Services: Patient declined    Database administrator or  Organizations: No    Attends Engineer, structural: Not on file    Marital Status: Married     Family History: The patient's family history includes Colon polyps in her mother. There is no history of Colon cancer, Esophageal cancer, Rectal cancer, or Stomach cancer. No family hx of CAD  ROS:   Please see the history of present illness.     All other systems reviewed and are negative.  EKGs/Labs/Other Studies Reviewed:    The following studies were reviewed today:  EKG Interpretation Date/Time:  Friday May 12 2023 11:28:20 EDT Ventricular Rate:  76 PR Interval:  136 QRS Duration:  74 QT Interval:  366 QTC Calculation: 411 R Axis:   20  Text Interpretation: Normal sinus rhythm Possible Left atrial enlargement When compared with ECG of 08-Apr-2023 09:04, PREVIOUS ECG IS PRESENT Confirmed by Carolan Clines ([id number]) on 05/12/2023 11:34:22 AM       Recent Labs: 04/08/2023: ALT 13; B Natriuretic Peptide 82.5; BUN 10; Creatinine, Ser 0.70; Hemoglobin 9.5; Magnesium 2.0; Platelets 220; Potassium 3.5; Sodium 143 04/21/2023: TSH 0.69   Recent Lipid Panel    Component Value Date/Time   CHOL 201 (H) 10/04/2022 0951   TRIG 94 10/04/2022 0951   HDL 81 10/04/2022 0951   CHOLHDL 2.5 10/04/2022 0951   CHOLHDL 2.4 09/14/2016 0918   VLDL 21 09/14/2016 0918   LDLCALC 103 (H) 10/04/2022 0951     Risk Assessment/Calculations:     Physical Exam:    VS:   Vitals:   05/12/23 1125  BP: 136/62  Pulse: 76  SpO2: 96%     Wt Readings from Last 3 Encounters:  04/25/23 131 lb (59.4 kg)  04/21/23 131 lb (59.4 kg)  04/12/23 130 lb 9.6 oz (59.2 kg)     GEN:  Well nourished, well developed in no acute distress HEENT: Normal LYMPHATICS: No lymphadenopathy CARDIAC: RRR, no murmurs, rubs, gallops RESPIRATORY:  Clear to auscultation without rales, wheezing or rhonchi  ABDOMEN: Soft, non-tender, non-distended MUSCULOSKELETAL:  No edema; No deformity  SKIN: Warm and dry NEUROLOGIC:   Alert and oriented x 3 PSYCHIATRIC:  Normal affect   ASSESSMENT:    1. Chest pain, unspecified type   - had negative ACS in the ED, EKG did not show  ischemia  - risk only includes age. She has no symptoms with activity. Symptoms have resolved. Stress likely contributing.  No signs of Takotsubo, I have low suspicion. - if her CP were to be related to activity or she has DOE, happy to see her back PLAN:  In order of problems listed above:  Follow up PRN     Medication Adjustments/Labs and Tests Ordered: Current medicines are reviewed at length with the patient today.  Concerns regarding medicines are outlined above.  Orders Placed This Encounter  Procedures   EKG 12-Lead   No orders of the defined types were placed in this encounter.   There are no Patient Instructions on file for this visit.   Signed, Maisie Fus, MD  05/12/2023 11:21 AM    [facility]

## 2023-05-12 NOTE — Patient Instructions (Signed)
Medication Instructions:   No changes  *If you need a refill on your cardiac medications before your next appointment, please call your pharmacy*   Lab Work: Not needed    Testing/Procedures: Not needed   Follow-Up: At [facility], you and your health needs are our priority.  As part of our continuing mission to provide you with exceptional heart care, we have created designated Provider Care Teams.  These Care Teams include your primary Cardiologist (physician) and Advanced Practice Providers (APPs -  Physician Assistants and Nurse Practitioners) who all work together to provide you with the care you need, when you need it.     Your next appointment:   As needed     The format for your next appointment:   In Person  Provider:   Maisie Fus, MD

## 2023-05-23 ENCOUNTER — Telehealth: Payer: Self-pay | Admitting: Neurology

## 2023-05-23 NOTE — Telephone Encounter (Signed)
Pt started gabapentin  on 04-21-23  and is having tingling and dizziness   She wants to know if there is something else that she can take or what Dr Everlena Cooper wants to do

## 2023-05-24 MED ORDER — DULOXETINE HCL 30 MG PO CPEP: 30.0000 mg | ORAL_CAPSULE | Freq: Every day | ORAL | 0 refills | Status: DC

## 2023-05-24 NOTE — Telephone Encounter (Signed)
Patient advised of Dr.Jaffe note, She may stop the gabapentin.  If she is agreeable, please send in prescription for duloxetine 30mg  daily.  We can increase dose in 6-8 weeks if needed.    Patient agree to medication change. Duloxetine sent to local pharmacy.

## 2023-05-29 ENCOUNTER — Telehealth: Payer: Self-pay | Admitting: Neurology

## 2023-05-29 NOTE — Telephone Encounter (Signed)
Telephone call to patient, Per patient she stopped the Gabapentin on Wednesday and start the duloxtine as directedon Friday 12th. Per patient the next day( Saturday 13th) she started feeling bad and refused to take the Medication again out of fear she will feel worse. Patient states she asked her Pharmacist what she should do in the mean time until she can call the office. She was advised to take the Gabapentin because she is having withdrawals from the Duloxetine she took Friday.     Per Patient she knows her  body and these two medication are not for her.  Please advise.

## 2023-05-29 NOTE — Telephone Encounter (Signed)
Patient called wanting information concerning medication that she was subscribed last week/KB

## 2023-05-29 NOTE — Telephone Encounter (Signed)
Patient advised of Dr.Jaffe note, I would wait until her symptoms resolve and then we can try another medication, nortriptyline 10mg  at bedtime.  The treatment for these headaches and nerve pain are antidepressants and seizure medications.

## 2023-06-06 ENCOUNTER — Encounter: Payer: Self-pay | Admitting: Dermatology

## 2023-06-06 ENCOUNTER — Ambulatory Visit (INDEPENDENT_AMBULATORY_CARE_PROVIDER_SITE_OTHER): Payer: Medicare HMO | Admitting: Dermatology

## 2023-06-06 VITALS — BP 100/60 | HR 77

## 2023-06-06 DIAGNOSIS — L608 Other nail disorders: Secondary | ICD-10-CM | POA: Diagnosis not present

## 2023-06-06 NOTE — Patient Instructions (Addendum)
Hello Hannah Landry,  Thank you for visiting my office today. I appreciate your commitment to monitoring your health and addressing your concerns regarding the pigment in your nails.  Here is a summary of the key points from our consultation:  - Diagnosis: Melanonychia   - You have a 1mm dark brown linear streak on the nail plate of your right index finger and a similar, lighter streak on your left index finger.   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  - Instructions:   - Monitoring: Please monitor both streaks for any significant changes in size or color.   - Follow-Up Appointments: Return in six months for a follow-up. If no changes are observed, we will then continue with annual check-ups.  - Blood Pressure: Your concern about your blood pressure reading of 100 over 60 was addressed. This reading is within normal limits, but we rechecked it to ensure accuracy and your peace of mind.  Please do not hesitate to contact my office if you have any further questions or if you notice any changes in the streaks on your nails.    Due to recent changes in healthcare laws, you may see results of your pathology and/or laboratory studies on MyChart before the doctors have had a chance to review them. We understand that in some cases there may be results that are confusing or concerning to you. Please understand that not all results are received at the same time and often the doctors may need to interpret multiple results in order to provide you with the best plan of care or course of treatment. Therefore, we ask that you please give [place] 2 business days to thoroughly review all your results before contacting the office for clarification. Should we see a critical lab result, you will be contacted sooner.   If You Need Anything After Your Visit  If you have any questions or concerns for your doctor, please call our main line at [phone] If no one answers, please leave a voicemail as  directed and we will return your call as soon as possible. Messages left after 4 pm will be answered the following business day.   You may also send [place] a message via MyChart. We typically respond to MyChart messages within 1-2 business days.  For prescription refills, please ask your pharmacy to contact our office. Our fax number is [phone].  If you have an urgent issue when the clinic is closed that cannot wait until the next business day, you can page your doctor at the number below.    Please note that while we do our best to be available for urgent issues outside of office hours, we are not available 24/7.   If you have an urgent issue and are unable to reach [place], you may choose to seek medical care at your doctor's office, retail clinic, urgent care center, or emergency room.  If you have a medical emergency, please immediately call 911 or go to the emergency department. In the event of inclement weather, please call our main line at [phone] for an update on the status of any delays or closures.  Dermatology Medication Tips: Please keep the boxes that topical medications come in in order to help keep track of the instructions about where and how to use these. Pharmacies typically print the medication instructions only on the boxes and not directly on the medication tubes.   If your medication is too expensive, please contact our office at [phone] or send [place]  a message through MyChart.   We are unable to tell what your co-pay for medications will be in advance as this is different depending on your insurance coverage. However, we may be able to find a substitute medication at lower cost or fill out paperwork to get insurance to cover a needed medication.   If a prior authorization is required to get your medication covered by your insurance company, please allow [place] 1-2 business days to complete this process.  Drug prices often vary depending on where the prescription is  filled and some pharmacies may offer cheaper prices.  The website www.goodrx.com contains coupons for medications through different pharmacies. The prices here do not account for what the cost may be with help from insurance (it may be cheaper with your insurance), but the website can give you the price if you did not use any insurance.  - You can print the associated coupon and take it with your prescription to the pharmacy.  - You may also stop by our office during regular business hours and pick up a GoodRx coupon card.  - If you need your prescription sent electronically to a different pharmacy, notify our office through [facility] or by phone at [phone]

## 2023-06-06 NOTE — Progress Notes (Signed)
   New Patient Visit   Subjective  Hannah Landry is a 77 y.o. female who presents for the following: nail discoloration   Patient states she has nail discoloration located at the right index finger that she would like to have examined. Patient reports the areas have been there for about a year. she reports the areas is not bothersome. She states her left index finger has a similar discoloration for years but it has always been a lighter color Patient reports she has not previously been treated for these areas. Patient has no Hx of skin cancer.Patient no family history of skin cancer(s).    The following portions of the chart were reviewed this encounter and updated as appropriate: medications, allergies, medical history  Review of Systems:  No other skin or systemic complaints except as noted in HPI or Assessment and Plan.  Objective  Well appearing patient in no apparent distress; mood and affect are within normal limits.   A focused examination was performed of the following areas: Bilateral index fingers  Relevant exam findings are noted in the Assessment and Plan.   Exam:  1mm dark brown linear streak on right inderx finger no hutchin signs no hyponychia  1mm light brown linear streak down left index finger             Assessment & Plan    1. Melanonychia - Right Index Finger - Assessment: 1mm dark brown linear streak involving the nail plate, no Hutchinson sign observed. - Plan: Advise patient to monitor the streak for any significant changes in size or appearance. Follow-up appointment in six months, and then again in six months if no changes are observed, followed by annual monitoring thereafter.  2. Melanonychia - Left Index Finger - Assessment: 1mm light brown linear streak involving the nail plate. - Plan: Patient is advised to monitor the streak closely, akin to monitoring any other mole. No further intervention is deemed necessary at this time.  No  follow-ups on file.  Owens Shark, CMA, am acting as scribe for [employer], DO.   Documentation: I have reviewed the above documentation for accuracy and completeness, and I agree with the above.  Langston Reusing, DO

## 2023-06-07 ENCOUNTER — Ambulatory Visit: Payer: 59 | Admitting: Neurology

## 2023-06-10 ENCOUNTER — Encounter: Payer: Self-pay | Admitting: Internal Medicine

## 2023-06-10 ENCOUNTER — Encounter (HOSPITAL_COMMUNITY): Payer: Self-pay | Admitting: Emergency Medicine

## 2023-06-10 ENCOUNTER — Other Ambulatory Visit: Payer: Self-pay

## 2023-06-10 ENCOUNTER — Emergency Department (HOSPITAL_COMMUNITY): Payer: Medicare HMO

## 2023-06-10 ENCOUNTER — Emergency Department (HOSPITAL_COMMUNITY)
Admission: EM | Admit: 2023-06-10 | Discharge: 2023-06-11 | Disposition: A | Discharge status: Home / Self Care | Payer: Medicare HMO | Attending: Emergency Medicine | Admitting: Emergency Medicine

## 2023-06-10 DIAGNOSIS — R7309 Other abnormal glucose: Secondary | ICD-10-CM | POA: Insufficient documentation

## 2023-06-10 DIAGNOSIS — D563 Thalassemia minor: Secondary | ICD-10-CM | POA: Diagnosis not present

## 2023-06-10 DIAGNOSIS — R5383 Other fatigue: Secondary | ICD-10-CM | POA: Diagnosis present

## 2023-06-10 DIAGNOSIS — R079 Chest pain, unspecified: Secondary | ICD-10-CM | POA: Diagnosis not present

## 2023-06-10 DIAGNOSIS — R202 Paresthesia of skin: Secondary | ICD-10-CM | POA: Diagnosis not present

## 2023-06-10 LAB — CBC
HCT: 30.8 % — ABNORMAL LOW (ref 36.0–46.0)
Hemoglobin: 9.1 g/dL — ABNORMAL LOW (ref 12.0–15.0)
MCH: 17.1 pg — ABNORMAL LOW (ref 26.0–34.0)
MCHC: 29.5 g/dL — ABNORMAL LOW (ref 30.0–36.0)
MCV: 57.9 fL — ABNORMAL LOW (ref 80.0–100.0)
Platelets: 248 10*3/uL (ref 150–400)
RBC: 5.32 MIL/uL — ABNORMAL HIGH (ref 3.87–5.11)
RDW: 19 % — ABNORMAL HIGH (ref 11.5–15.5)
WBC: 9.4 10*3/uL (ref 4.0–10.5)
nRBC: 0.3 % — ABNORMAL HIGH (ref 0.0–0.2)

## 2023-06-10 LAB — BASIC METABOLIC PANEL
Anion gap: 15 (ref 5–15)
BUN: 10 mg/dL (ref 8–23)
CO2: 28 mmol/L (ref 22–32)
Calcium: 10 mg/dL (ref 8.9–10.3)
Chloride: 101 mmol/L (ref 98–111)
Creatinine, Ser: 0.96 mg/dL (ref 0.44–1.00)
GFR, Estimated: >60 mL/min (ref >60)
Glucose, Bld: 142 mg/dL — ABNORMAL HIGH (ref 70–99)
Potassium: 3.5 mmol/L (ref 3.5–5.1)
Sodium: 144 mmol/L (ref 135–145)

## 2023-06-10 LAB — TROPONIN I (HIGH SENSITIVITY)
Troponin I (High Sensitivity): 7 ng/L (ref <18)
Troponin I (High Sensitivity): 6 ng/L (ref <18)

## 2023-06-10 NOTE — ED Provider Notes (Signed)
[place] EMERGENCY DEPARTMENT AT [facility] Provider Note   CSN: [id number] Arrival date & time: 06/10/23  2035     History  Chief Complaint  Patient presents with   Chest Pain    Hannah Landry is a 77 y.o. female.  The history is provided by the patient.  Chest Pain She has history of hyperlipidemia, thalassemia and comes in with 2 complaints.  She had an episode of chest discomfort this afternoon which she describes as a tight, pressure feeling.  It was present with movement and standing, better with sitting still but not worse with walking.  There is mild associated nausea but no vomiting.  Denies dyspnea or diaphoresis.  Symptoms lasted about 2 hours before resolving.  She has been symptom free since approximately 6 PM.  She did check her blood pressure which had gone up to 180 (usually runs 125-135) and states that her heart rate increased to 90 which is high for her.  Blood pressure did come down and heart rate did come down after symptoms resolved.  She does admit that for the last several weeks she has had some more fatigue than normal.  She had recently been evaluated by a cardiologist for an episode of chest pain, but states that when she had today was different.  She also has concerns that she has had some numbness in her legs which her neurologist felt was neuropathy.  She had been given a prescription for gabapentin but she stopped because of side effects, switched to duloxetine which she also stopped because of side effects.  She did have a major life event and that her husband died about 3 months ago.  She had been taking vitamin D B12 but blood level was reported to be high and she was told to stop taking it.  She has numerous questions about her symptoms and adequacy of workup that has been done.  She is a non-smoker and there is no history of diabetes or hypertension and there is no family history of premature coronary atherosclerosis.  She is not on any medication  for her hyperlipidemia.   Home Medications Prior to Admission medications   Medication Sig Start Date End Date Taking? Authorizing Provider  acetaminophen (TYLENOL) 325 MG tablet Take 650 mg by mouth every 6 (six) hours as needed.    [provider]  ALPHAGAN P 0.15 % ophthalmic solution  12/28/21   [provider]  Calcium Carbonate-Vit D-Min (CALTRATE 600+D PLUS MINERALS) 600-800 MG-UNIT TABS Take by mouth.    [provider]  Dorzolamide HCl-Timolol Mal PF 2-0.5 % SOLN Apply 1 drop to eye 2 (two) times daily. 10/19/21   [provider]  DULoxetine (CYMBALTA) 30 MG capsule Take 1 capsule (30 mg total) by mouth daily. 05/24/23   Everlena Cooper, Adam R, DO  fluticasone (CUTIVATE) 0.05 % cream Apply topically 2 (two) times daily. 10/04/22   Carney Living, MD  folic acid (FOLVITE) 400 MCG tablet Take 400 mcg by mouth daily.      [provider]  gabapentin (NEURONTIN) 100 MG capsule Take 1 capsule (100 mg total) by mouth at bedtime. 04/21/23   Drema Dallas, DO  Garlic 1000 MG CAPS Take by mouth.    [provider]  Multiple Vitamins-Minerals (CENTRUM SILVER ADULT 50+) TABS Take 1 tablet by mouth daily. 12/16/99   [provider]  valACYclovir (VALTREX) 1000 MG tablet Take 2 tabs now and repeat in 12 hours 10/04/22   Chambliss,  Estill Batten, MD      Allergies    Patient has no known allergies.    Review of Systems   Review of Systems  Cardiovascular:  Positive for chest pain.  All other systems reviewed and are negative.   Physical Exam Updated Vital Signs BP (!) 141/74 (BP Location: Right Arm)   Pulse 80   Temp 97.7 F (36.5 C) (Oral)   Resp 19   Ht 5\' 1"  (1.549 m)   Wt 59.5 kg   SpO2 100%   BMI 24.79 kg/m  Physical Exam Vitals and nursing note reviewed.   77 year old female, resting comfortably and in no acute distress. Vital signs are significant for borderline elevated blood pressure. Oxygen saturation is 100%, which is  normal. Head is normocephalic and atraumatic. PERRLA, EOMI. Oropharynx is clear. Neck is nontender and supple without adenopathy or JVD. Back is nontender and there is no CVA tenderness. Lungs are clear without rales, wheezes, or rhonchi. Chest is nontender. Heart has regular rate and rhythm without murmur. Abdomen is soft, flat, nontender. Extremities have no cyanosis or edema, full range of motion is present. Skin is warm and dry without rash. Neurologic: Mental status is normal, cranial nerves are intact, moves all extremities equally.  Sensation is intact in all 4 extremities without evidence of peripheral neuropathy on my exam.  ED Results / Procedures / Treatments   Labs (all labs ordered are listed, but only abnormal results are displayed) Labs Reviewed  BASIC METABOLIC PANEL - Abnormal; Notable for the following components:      Result Value   Glucose, Bld 142 (*)    All other components within normal limits  CBC - Abnormal; Notable for the following components:   RBC 5.32 (*)    Hemoglobin 9.1 (*)    HCT 30.8 (*)    MCV 57.9 (*)    MCH 17.1 (*)    MCHC 29.5 (*)    RDW 19.0 (*)    nRBC 0.3 (*)    All other components within normal limits  TROPONIN I (HIGH SENSITIVITY)  TROPONIN I (HIGH SENSITIVITY)    EKG SHIRLINE, COELHO [id number] 10-Jun-2023 20:30:59 [facility]/ED ROUTINE RECORD [birth year] (76 yr) Female Black Room: Loc:11 Technician: Test ind: Vent. rate 83 BPM PR interval 132 ms QRS duration 72 ms QT/QTcB 366/430 ms P-R-T axes -7 17 0 Normal sinus rhythm Nonspecific ST abnormality Abnormal ECG When compared with ECG of 12-May-2023 11:28, No significant change was found Confirmed by Dione Booze ([id number]) on 06/10/2023 10:59:59 PM Confirmed By: Dione Booze  Radiology DG Chest 2 View  Result Date: 06/10/2023 CLINICAL DATA:  Chest pain EXAM: CHEST - 2 VIEW COMPARISON:  CTA chest 04/08/2023 and radiographs 04/08/2023 FINDINGS: Stable  cardiomediastinal silhouette. No focal consolidation, pleural effusion, or pneumothorax. Chronic interstitial coarsening. No displaced rib fractures. IMPRESSION: No active cardiopulmonary disease. Electronically Signed   By: Minerva Fester M.D.   On: 06/10/2023 21:12    Procedures Procedures  Cardiac monitor shows normal sinus rhythm, per my interpretation.  Medications Ordered in ED Medications - No data to display  ED Course/ Medical Decision Making/ A&P                             Medical Decision Making Amount and/or Complexity of Data Reviewed Labs: ordered. Radiology: ordered.   Chest pain of uncertain cause.  ECG is unchanged from prior, initial troponin is normal.  Chest  x-ray shows no acute process.  I have independently viewed the images, and agree with radiologist interpretation.  I reviewed and interpreted her laboratory tests and my interpretation is stable anemia, elevated random glucose level which will need to be followed as an outpatient.  Heart score is 3, which puts her at low risk for major adverse cardiac events in the next 6 weeks.  Repeat troponin is pending..  I reviewed her past records, and she was seen by cardiologist on 05/12/2023 for chest pain with no indication for additional workup.  I believe she will need follow-up with cardiology and consideration for stress testing given episode of chest pain today.  She had an office evaluation by neurology on 04/21/2023 with diagnosis of possible neuropathy and laboratory workup was done which was unremarkable except for mildly elevated B12 level.  Patient has numerous questions regarding her neuropathy and chest pain.  I am referring her back to her cardiologist and neurologist to go through those questions.  I suspect that this some symptoms may be related to the major life event with death of her husband 3 months ago.  Repeat troponin is unchanged, I am discharging the patient with follow-up as noted above.  Final  Clinical Impression(s) / ED Diagnoses Final diagnoses:  Nonspecific chest pain  Thalassemia minor  Paresthesia of bilateral legs  Elevated random blood glucose level    Rx / DC Orders ED Discharge Orders          Ordered    Ambulatory referral to Cardiology       Comments: If you have not heard from the Cardiology office within the next 72 hours please call [phone].   06/10/23 2353              Dione Booze, MD 06/10/23 2355

## 2023-06-10 NOTE — Discharge Instructions (Addendum)
You have many questions about ear current health concerns.  Please work with your primary care provider and your neurologist and your cardiologist to have these questions answered.  If you have any concerns, please feel free to ask your primary care provider to provide referral for a second opinion.

## 2023-06-10 NOTE — ED Triage Notes (Signed)
Pt c/o intermittently since 1530 today with elevated BP and heart rate per pt. Pt states that normally her HR is 70s, increased to 90s when she stood up. Highest BP at home was 180s systolic.

## 2023-06-12 ENCOUNTER — Ambulatory Visit: Payer: Medicare HMO | Attending: Physician Assistant | Admitting: Physician Assistant

## 2023-06-12 ENCOUNTER — Encounter: Payer: Self-pay | Admitting: Physician Assistant

## 2023-06-12 VITALS — BP 120/60 | HR 72 | Ht 61.0 in | Wt 129.0 lb

## 2023-06-12 DIAGNOSIS — R202 Paresthesia of skin: Secondary | ICD-10-CM

## 2023-06-12 DIAGNOSIS — R030 Elevated blood-pressure reading, without diagnosis of hypertension: Secondary | ICD-10-CM

## 2023-06-12 DIAGNOSIS — R200 Anesthesia of skin: Secondary | ICD-10-CM

## 2023-06-12 DIAGNOSIS — R609 Edema, unspecified: Secondary | ICD-10-CM

## 2023-06-12 DIAGNOSIS — R000 Tachycardia, unspecified: Secondary | ICD-10-CM

## 2023-06-12 DIAGNOSIS — R079 Chest pain, unspecified: Secondary | ICD-10-CM | POA: Diagnosis not present

## 2023-06-12 MED ORDER — METOPROLOL TARTRATE 25 MG PO TABS: 12.5000 mg | ORAL_TABLET | Freq: Two times a day (BID) | ORAL | 3 refills | Status: DC

## 2023-06-12 NOTE — Patient Instructions (Addendum)
Medication Instructions:  Your physician has recommended you make the following change in your medication: START metoprolol tartrate (Lopressor) 25mg  take 1/2 (one half) tablet by mouth 2 (two) times daily  *If you need a refill on your cardiac medications before your next appointment, please call your pharmacy*   Lab Work: None  If you have labs (blood work) drawn today and your tests are completely normal, you will receive your results only by: MyChart Message (if you have MyChart) OR A paper copy in the mail If you have any lab test that is abnormal or we need to change your treatment, we will call you to review the results.   Testing/Procedures: Your physician has requested that you have an echocardiogram. Echocardiography is a painless test that uses sound waves to create images of your heart. It provides your doctor with information about the size and shape of your heart and how well your heart's chambers and valves are working. This procedure takes approximately one hour. There are no restrictions for this procedure. Please do NOT wear perfume or lotions (deodorant is allowed). Please arrive 15 minutes prior to your appointment time.  Your physician has requested that you have an ankle brachial index (ABI). During this test an ultrasound and blood pressure cuff are used to evaluate the arteries that supply the arms and legs with blood. Allow thirty minutes for this exam. There are no restrictions or special instructions.     Follow-Up: At [facility], you and your health needs are our priority.  As part of our continuing mission to provide you with exceptional heart care, we have created designated Provider Care Teams.  These Care Teams include your primary Cardiologist (physician) and Advanced Practice Providers (APPs -  Physician Assistants and Nurse Practitioners) who all work together to provide you with the care you need, when you need it.  We recommend signing up for  the patient portal called "MyChart".  Sign up information is provided on this After Visit Summary.  MyChart is used to connect with patients for Virtual Visits (Telemedicine).  Patients are able to view lab/test results, encounter notes, upcoming appointments, etc.  Non-urgent messages can be sent to your provider as well.   To learn more about what you can do with MyChart, go to [email or web address].    Your next appointment:   6 week(s)  Provider:   Jari Favre, PA-C         Other Instructions Postural Orthostatic Tachycardia Syndrome Postural orthostatic tachycardia syndrome (POTS) is a group of symptoms that occur along with an increase in heart rate when a person stands up after lying down. The symptoms include light-headedness or fainting, and they improve when the person lies back down. POTS may be associated with another medical condition, or it may occur on its own. What are the causes? The cause of this condition is not known, but many conditions and diseases are associated with it. What increases the risk? This condition is more likely to develop in: Women 46-17 years old. Women who are pregnant. Women who are in their period (menstruating). People who have certain conditions, such as: Infection from a virus. Diseases that cause the body's defense system (immune system) to attack healthy organs. These are called autoimmune diseases. Losing a lot of red blood cells (anemia). Losing too much water in the body (dehydration). An overactive thyroid (hyperthyroidism). People who take certain medicines. People who have had a major injury. People who have had surgery. What are the signs  or symptoms? The most common symptom of this condition is light-headedness when you stand up from a lying or sitting position. Other symptoms may include: Feeling a rapid increase in the heartbeat (tachycardia) within 10 minutes of standing up. Chest pain. Shortness of breath. Breathing that is  deeper and faster than normal (hyperventilation). Fainting. Confusion. Trembling. Weakness. Headache. Anxiety. Nausea. Sweating or flushing. Symptoms may be worse in the morning, and they may be relieved by lying down. How is this diagnosed? This condition is diagnosed based on: Your symptoms. Your medical history. A physical exam. Checking your heart rate when you are lying down and after you stand up. Checking your blood pressure when you go from lying down to standing up. Blood and urine tests to measure hormones that change with blood pressure. The blood tests will be done when you are lying down and when you are standing up. You may have other tests to check for conditions or diseases that are associated with POTS. How is this treated? Treatment for this condition depends on how severe your symptoms are and whether you have any conditions or diseases that are associated with POTS. Treatment may involve: Treating any conditions or diseases that are associated with POTS. Drinking two glasses of water before getting up from a lying position. Increasing salt (sodium) in your diet. Taking medicine to control blood pressure and heart rate (beta-blocker). Avoiding certain medicines. Starting an exercise program under the supervision of a health care provider. Follow these instructions at home: Medicines Take over-the-counter and prescription medicines only as told by your health care provider. Let your health care provider know about all prescription or over-the-counter medicines you take. These include herbs, vitamins, and supplements. You may need to stop or adjust some medicines if they cause this condition. Talk with your health care provider before starting any new medicines. Eating and drinking  Drink enough fluid to keep your urine pale yellow. If told by your health care provider, drink two glasses of water before getting up from a lying position. Follow instructions from  your health care provider about how much sodium you should include in your diet. Avoid heavy meals. Eat several small meals a day instead of a few large meals. General instructions Do an aerobic exercise for 20 minutes a day, at least 3 days a week. Aerobic exercises are those that cause your heart to beat faster. Ask your health care provider what kinds of exercise are safe for you. Do not use any products that contain nicotine or tobacco. These products include cigarettes, chewing tobacco, and vaping devices, such as e-cigarettes. These can interfere with blood flow. If you need help quitting, ask your health care provider. Keep all follow-up visits. This is important. Contact a health care provider if: Your symptoms do not improve after treatment. Your symptoms get worse. You develop new symptoms. Get help right away if: You have chest pain. You have difficulty breathing. You have fainting episodes. These symptoms may be an emergency. Get help right away. Call 911. Do not wait to see if the symptoms will go away. Do not drive yourself to the hospital. Summary POTS is a group of symptoms that occur along with an increase in heart rate when a person stands up after lying down. The most common symptom is light-headedness when you stand up. Treatment for this condition includes treating any underlying conditions, drinking plenty of water, stopping or changing some medicines, or starting an exercise program. Get help right away if you have  chest pain, difficulty breathing, or fainting episodes. These symptoms may be an emergency. This information is not intended to replace advice given to you by your health care provider. Make sure you discuss any questions you have with your health care provider. Document Revised: 05/13/2021 Document Reviewed: 05/13/2021 Elsevier Patient Education  2024 [employer]  Your physician has requested that you regularly monitor and record your blood pressure  readings at home. Please use the same machine at the same time of day to check your readings and record them to bring to your follow-up visit.   Please monitor blood pressures and keep a log of your readings.    Make sure to check 2 hours after your medications.    AVOID these things for 30 minutes before checking your blood pressure: No Drinking caffeine. No Drinking alcohol. No Eating. No Smoking. No Exercising.   Five minutes before checking your blood pressure: Pee. Sit in a dining chair. Avoid sitting in a soft couch or armchair. Be quiet. Do not talk    Heart-Healthy Eating Plan Many factors influence your heart health, including eating and exercise habits. Heart health is also called coronary health. Coronary risk increases with abnormal blood fat (lipid) levels. A heart-healthy eating plan includes limiting unhealthy fats, increasing healthy fats, limiting salt (sodium) intake, and making other diet and lifestyle changes. What is my plan? Your health care provider may recommend that: You limit your fat intake to _________% or less of your total calories each day. You limit your saturated fat intake to _________% or less of your total calories each day. You limit the amount of cholesterol in your diet to less than _________ mg per day. You limit the amount of sodium in your diet to less than _________ mg per day. What are tips for following this plan? Cooking Cook foods using methods other than frying. Baking, boiling, grilling, and broiling are all good options. Other ways to reduce fat include: Removing the skin from poultry. Removing all visible fats from meats. Steaming vegetables in water or broth. Meal planning  At meals, imagine dividing your plate into fourths: Fill one-half of your plate with vegetables and green salads. Fill one-fourth of your plate with whole grains. Fill one-fourth of your plate with lean protein foods. Eat 2-4 cups of vegetables per day.  One cup of vegetables equals 1 cup (91 g) broccoli or cauliflower florets, 2 medium carrots, 1 large bell pepper, 1 large sweet potato, 1 large tomato, 1 medium white potato, 2 cups (150 g) raw leafy greens. Eat 1-2 cups of fruit per day. One cup of fruit equals 1 small apple, 1 large banana, 1 cup (237 g) mixed fruit, 1 large orange,  cup (82 g) dried fruit, 1 cup (240 mL) 100% fruit juice. Eat more foods that contain soluble fiber. Examples include apples, broccoli, carrots, beans, peas, and barley. Aim to get 25-30 g of fiber per day. Increase your consumption of legumes, nuts, and seeds to 4-5 servings per week. One serving of dried beans or legumes equals  cup (90 g) cooked, 1 serving of nuts is  oz (12 almonds, 24 pistachios, or 7 walnut halves), and 1 serving of seeds equals  oz (8 g). Fats Choose healthy fats more often. Choose monounsaturated and polyunsaturated fats, such as olive and canola oils, avocado oil, flaxseeds, walnuts, almonds, and seeds. Eat more omega-3 fats. Choose salmon, mackerel, sardines, tuna, flaxseed oil, and ground flaxseeds. Aim to eat fish at least 2 times each week.  Check food labels carefully to identify foods with trans fats or high amounts of saturated fat. Limit saturated fats. These are found in animal products, such as meats, butter, and cream. Plant sources of saturated fats include palm oil, palm kernel oil, and coconut oil. Avoid foods with partially hydrogenated oils in them. These contain trans fats. Examples are stick margarine, some tub margarines, cookies, crackers, and other baked goods. Avoid fried foods. General information Eat more home-cooked food and less restaurant, buffet, and fast food. Limit or avoid alcohol. Limit foods that are high in added sugar and simple starches such as foods made using white refined flour (white breads, pastries, sweets). Lose weight if you are overweight. Losing just 5-10% of your body weight can help your  overall health and prevent diseases such as diabetes and heart disease. Monitor your sodium intake, especially if you have high blood pressure. Talk with your health care provider about your sodium intake. Try to incorporate more vegetarian meals weekly. What foods should I eat? Fruits All fresh, canned (in natural juice), or frozen fruits. Vegetables Fresh or frozen vegetables (raw, steamed, roasted, or grilled). Green salads. Grains Most grains. Choose whole wheat and whole grains most of the time. Rice and pasta, including brown rice and pastas made with whole wheat. Meats and other proteins Lean, well-trimmed beef, veal, pork, and lamb. Chicken and [place] without skin. All fish and shellfish. Wild duck, rabbit, pheasant, and venison. Egg whites or low-cholesterol egg substitutes. Dried beans, peas, lentils, and tofu. Seeds and most nuts. Dairy Low-fat or nonfat cheeses, including ricotta and mozzarella. Skim or 1% milk (liquid, powdered, or evaporated). Buttermilk made with low-fat milk. Nonfat or low-fat yogurt. Fats and oils Non-hydrogenated (trans-free) margarines. Vegetable oils, including soybean, sesame, sunflower, olive, avocado, peanut, safflower, corn, canola, and cottonseed. Salad dressings or mayonnaise made with a vegetable oil. Beverages Water (mineral or sparkling). Coffee and tea. Unsweetened ice tea. Diet beverages. Sweets and desserts Sherbet, gelatin, and fruit ice. Small amounts of dark chocolate. Limit all sweets and desserts. Seasonings and condiments All seasonings and condiments. The items listed above may not be a complete list of foods and beverages you can eat. Contact a dietitian for more options. What foods should I avoid? Fruits Canned fruit in heavy syrup. Fruit in cream or butter sauce. Fried fruit. Limit coconut. Vegetables Vegetables cooked in cheese, cream, or butter sauce. Fried vegetables. Grains Breads made with saturated or trans fats, oils, or  whole milk. Croissants. Sweet rolls. Donuts. High-fat crackers, such as cheese crackers and chips. Meats and other proteins Fatty meats, such as hot dogs, ribs, sausage, bacon, rib-eye roast or steak. High-fat deli meats, such as salami and bologna. Caviar. Domestic duck and goose. Organ meats, such as liver. Dairy Cream, sour cream, cream cheese, and creamed cottage cheese. Whole-milk cheeses. Whole or 2% milk (liquid, evaporated, or condensed). Whole buttermilk. Cream sauce or high-fat cheese sauce. Whole-milk yogurt. Fats and oils Meat fat, or shortening. Cocoa butter, hydrogenated oils, palm oil, coconut oil, palm kernel oil. Solid fats and shortenings, including bacon fat, salt pork, lard, and butter. Nondairy cream substitutes. Salad dressings with cheese or sour cream. Beverages Regular sodas and any drinks with added sugar. Sweets and desserts Frosting. Pudding. Cookies. Cakes. Pies. Milk chocolate or white chocolate. Buttered syrups. Full-fat ice cream or ice cream drinks. The items listed above may not be a complete list of foods and beverages to avoid. Contact a dietitian for more information. Summary Heart-healthy meal planning includes limiting  unhealthy fats, increasing healthy fats, limiting salt (sodium) intake and making other diet and lifestyle changes. Lose weight if you are overweight. Losing just 5-10% of your body weight can help your overall health and prevent diseases such as diabetes and heart disease. Focus on eating a balance of foods, including fruits and vegetables, low-fat or nonfat dairy, lean protein, nuts and legumes, whole grains, and heart-healthy oils and fats. This information is not intended to replace advice given to you by your health care provider. Make sure you discuss any questions you have with your health care provider. Document Revised: 12/06/2021 Document Reviewed: 12/06/2021 Elsevier Patient Education  2024 [employer].

## 2023-06-12 NOTE — Progress Notes (Signed)
Cardiology Office Note:  .   Date:  06/12/2023  ID:  Hannah Landry, DOB [birth year], MRN [id number] PCP: Hannah Living, MD  [facility] Providers Cardiologist:  Hannah Fus, MD {  History of Present Illness: .   Hannah Landry is a 77 y.o. female with a past medical history of hyperlipidemia, thalassemia who comes in for follow-up appointment.  Episode of chest discomfort that sent her to the ED.  She described the chest pain as tight, pressure feeling.  Present with movement and standing, better with sitting but not worse with walking.  Mild associated nausea but no vomiting.  Denied dyspnea and diaphoresis.  Symptoms lasted about 2 hours before resolving.  Had been symptom free since about 6 PM the day of ED arrival.  Did check blood pressure which was up to 180 systolic.  Typically blood pressure runs 1 25-1 35 systolic.  Heart rate is also increased up to 90 which was high for her.  Blood pressure did come down and heart rate did come down after symptoms resolved.  She does admit that for the last several weeks she had more fatigued than usual.  Recently been evaluated by cardiology for chest pain but stated that it was different at that time.  Concerned that she has some numbness in her legs which her neurologist felt was neuropathy.  Given a prescription for gabapentin but she stopped due to side effects, switched to duloxetine which also stopped because of side effects and her husband passed away about 3 months prior.  Was taking vitamin B12 and D but blood levels reported to be high so she was told to stop taking it.  Non-smoker and no history of diabetes or hypertension and no family history of premature coronary atherosclerosis.  Not on any medications for hyperlipidemia.  Today, she states her neurologist, started gabapentin, nausea and didn't feel well, took duloxetine-stopped that one too nausea. Ibuprofen does help. No pain with walking. Weakness with the tingling in her  legs and tops of her feet. Never feel like her feet are cold. Good pulses. He says her excellerated heart rate is when she is moving around. Up to 111 bpm which is unusual for her. She felt the chest tightness this morning, saturaday it started when pressure when up to 180 mmhg. HR and BP usually up at the same time. No SOB, no lightheadedness or dizziness, no racing of her HR. 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 Tingling more in the daytime, ibuprofen helps most of the time. We encouraged water intake and salty snacks to keep her BP elevated. She is willing to try some metoprolol to try and control her HR a little better. She also has a growth on the top of her mouth, recommended follow-up with PCP.  Reports no shortness of breath nor dyspnea on exertion. Reports no chest pain, pressure, or tightness. No edema, orthopnea, PND.   ROS: pertinent ROS in HPI  Studies Reviewed: .       Cardiac MRI 9/31/21 IMPRESSION: 1. No evidence of cardiac iron overload, with normal myocardial T2* (42 ms)   2.  Normal LV size, wall thickness, and systolic function (EF 62%)   3.  Normal RV size and systolic function (EF 61 %)   4.  No late gadolinium enhancement to suggest myocardial scar      Physical Exam:   VS:  Ht 5\' 1"  (1.549 m)   Wt 129 lb (58.5 kg)  BMI 24.37 kg/m    Wt Readings from Last 3 Encounters:  06/12/23 129 lb (58.5 kg)  06/10/23 131 lb 2.8 oz (59.5 kg)  05/12/23 131 lb 3.2 oz (59.5 kg)    GEN: Well nourished, well developed in no acute distress NECK: No JVD; No carotid bruits CARDIAC: RRR, no murmurs, rubs, gallops RESPIRATORY:  Clear to auscultation without rales, wheezing or rhonchi  ABDOMEN: Soft, non-tender, non-distended EXTREMITIES:  No edema; No deformity   ASSESSMENT AND PLAN: .   1.  Chest pain -Troponin normal in the ED.  EKG unchanged from prior. -will order an echo for further workup  2. Inappropriate tachycardia /POTs like symptoms -salty  snacks -echo -increase water intake -limit alcohol and caffeine -handout provided -add metoprolol 12.5mg  BID  3. LE tingling/neuropathy rules out -will plan for ABIs and LE [place] -good pulses today, some superficial veins and possible varicosities -asked her to wear LE compression hose/socks      Dispo: She can follow-up in 6 weeks with me or another APP  Signed, Hannah Dory, PA-C

## 2023-06-12 NOTE — Telephone Encounter (Signed)
Does this patient need to see [facility] this week or does [facility] or [facility] have a spot? Lorayne Marek, RN

## 2023-06-12 NOTE — Progress Notes (Signed)
Cardiology Office Note:  .   Date:  06/12/2023  ID:  Leyla Peoples, DOB 1946/09/09, MRN [id number] PCP: Carney Living, MD  [facility] Providers Cardiologist:  Maisie Fus, MD {  History of Present Illness: .   Hannah Landry is a 77 y.o. female with a past medical history of hyperlipidemia, thalassemia who comes in for follow-up appointment.  Episode of chest discomfort that sent her to the ED.  She described the chest pain as tight, pressure feeling.  Present with movement and standing, better with sitting but not worse with walking.  Mild associated nausea but no vomiting.  Denied dyspnea and diaphoresis.  Symptoms lasted about 2 hours before resolving.  Had been symptom free since about 6 PM the day of ED arrival.  Did check blood pressure which was up to 180 systolic.  Typically blood pressure runs 1 25-1 35 systolic.  Heart rate is also increased up to 90 which was high for her.  Blood pressure did come down and heart rate did come down after symptoms resolved.  She does admit that for the last several weeks she had more fatigued than usual.  Recently been evaluated by cardiology for chest pain but stated that it was different at that time.  Concerned that she has some numbness in her legs which her neurologist felt was neuropathy.  Given a prescription for gabapentin but she stopped due to side effects, switched to duloxetine which also stopped because of side effects and her husband passed away about 3 months prior.  Was taking vitamin B12 and D but blood levels reported to be high so she was told to stop taking it.  Non-smoker and no history of diabetes or hypertension and no family history of premature coronary atherosclerosis.  Not on any medications for hyperlipidemia.  Today, she states her neurologist, started gabapentin, nausea and didn't feel well, took duloxetine-stopped that one too nausea. Ibuprofen does help. No pain with walking. Weakness with the tingling in her  legs and tops of her feet. Never feel like her feet are cold. Good pulses. He says her excellerated heart rate is when she is moving around. Up to 111 bpm which is unusual for her. She felt the chest tightness this morning, saturaday it started when pressure when up to 180 mmhg. HR and BP usually up at the same time. No SOB, no lightheadedness or dizziness, no racing of her HR. 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 Tingling more in the daytime, ibuprofen helps most of the time. We encouraged water intake and salty snacks to keep her BP elevated. She is willing to try some metoprolol to try and control her HR a little better. She also has a growth on the top of her mouth, recommended follow-up with PCP.  Reports no shortness of breath nor dyspnea on exertion. Reports no chest pain, pressure, or tightness. No edema, orthopnea, PND.   ROS: pertinent ROS in HPI  Studies Reviewed: Marland Kitchen   EKG Interpretation Date/Time:  Monday June 12 2023 10:14:08 EDT Ventricular Rate:  72 PR Interval:  134 QRS Duration:  70 QT Interval:  372 QTC Calculation: 407 R Axis:   51  Text Interpretation: Normal sinus rhythm Normal ECG When compared with ECG of 10-Jun-2023 20:30, T wave inversion no longer evident in Inferior leads Confirmed by Jari Favre [phone]) on 06/12/2023 10:18:40 AM   Cardiac MRI 9/31/21 IMPRESSION: 1. No evidence of cardiac iron overload, with normal myocardial T2* (42  ms)   2.  Normal LV size, wall thickness, and systolic function (EF 62%)   3.  Normal RV size and systolic function (EF 61 %)   4.  No late gadolinium enhancement to suggest myocardial scar      Physical Exam:   VS:  BP 120/60   Pulse 72   Ht 5\' 1"  (1.549 m)   Wt 129 lb (58.5 kg)   SpO2 99%   BMI 24.37 kg/m    Wt Readings from Last 3 Encounters:  06/12/23 129 lb (58.5 kg)  06/10/23 131 lb 2.8 oz (59.5 kg)  05/12/23 131 lb 3.2 oz (59.5 kg)    GEN: Well nourished, well developed in no acute  distress NECK: No JVD; No carotid bruits CARDIAC: RRR, no murmurs, rubs, gallops RESPIRATORY:  Clear to auscultation without rales, wheezing or rhonchi  ABDOMEN: Soft, non-tender, non-distended EXTREMITIES:  No edema; No deformity   ASSESSMENT AND PLAN: .   1.  Chest pain -Troponin normal in the ED.  EKG unchanged from prior. -will order an echo for further workup  2. Inappropriate tachycardia /POTs like symptoms -salty snacks -echo -increase water intake -limit alcohol and caffeine -handout provided -add metoprolol 12.5mg  BID  3. LE tingling/neuropathy ruled out -will plan for ABIs and LE [place] -good pulses today, some superficial veins and possible varicosities -asked her to wear LE compression hose/socks      Dispo: She can follow-up in 6 weeks with me or another APP  Signed, Sharlene Dory, PA-C

## 2023-06-13 ENCOUNTER — Telehealth: Payer: Self-pay | Admitting: Physician Assistant

## 2023-06-13 ENCOUNTER — Encounter: Payer: Self-pay | Admitting: Family Medicine

## 2023-06-13 NOTE — Telephone Encounter (Signed)
  The patient called and mentioned that she forgot to ask Tessa something during her visit yesterday. She requested if Julian Hy could call her back at [phone].

## 2023-06-13 NOTE — Telephone Encounter (Signed)
Called patient back   reviewed that her HR had gotten to 111 over the weekend.  This has not occurred again but did get to 100 when she was up being active.  She wanted to know if she can drive if her bpm get to 130.    She reports previous lightheadedness/dizziness when HR elevated.  Meant to ask APP yesterday if she is okay driving or should she not.  I adv she is fine to drive regardless if it is 100 or 111 or 70, as long as she is not lightheaded or dizzy.  She has started the Lopressor.   She is appreciative for the information provided.

## 2023-06-15 ENCOUNTER — Telehealth: Payer: Self-pay | Admitting: Family Medicine

## 2023-06-15 NOTE — Telephone Encounter (Signed)
Spoke with her  Overall mood is ok Started metoprolol and measuring her blood pressure Has appointment for echo and lower extremity vascular study  Recommend continue exercise Bring in blood pressure cuff readings to next visit

## 2023-06-16 ENCOUNTER — Telehealth: Payer: Self-pay | Admitting: Internal Medicine

## 2023-06-16 MED ORDER — METOPROLOL TARTRATE 25 MG PO TABS: 25.0000 mg | ORAL_TABLET | Freq: Two times a day (BID) | ORAL | 3 refills | Status: DC

## 2023-06-16 NOTE — Telephone Encounter (Signed)
Ibuprofen can increase blood pressure and it looks like hers has been running high. No other contraindications noted. Would recommend lowest effective dose, Tylenol is preferred though.

## 2023-06-16 NOTE — Telephone Encounter (Signed)
Called pt back to let her know what Margaretmary Dys, RPH-CPP said about Ibuprofen and its usage. Pt stated understanding and was grateful for all the help.

## 2023-06-16 NOTE — Telephone Encounter (Signed)
Pt c/o BP issue: STAT if pt c/o blurred vision, one-sided weakness or slurred speech  1. What are your last 5 BP readings?  160/82 HR 72 141/79 HR 66 171/88 HR 73  161/84 HR 74 -- most recent   2. Are you having any other symptoms (ex. Dizziness, headache, blurred vision, passed out)? Heaviness in chest   3. What is your BP issue? Patient was prescribed new medication and has had chest heaviness on and off all day.

## 2023-06-16 NOTE — Telephone Encounter (Signed)
Spoke with Pt per stat call. Pt was seen this past Monday by Jari Favre as a follow up to a ED visit. Pt states she is worried about her BP being high today. Readings from today are as follows 160/82 HR 72, 141/79 HR 66, 171/88 HR 73, and the most recent at 2:44 pm was 161/84 HR 74. Asked pt to give me readings from the past few days in which pt had 8+ readings for each day. Asked Pt for readings around 2 hours after she woke up and after taking medication. 7/30- 118/62 7/31- 128/69 8/1- 126/66  Pt stated that other than a light headache that comes and goes she is having no other symptoms. Told Pt I would speak with Julian Hy who is in office today and get back to her with her recommendations. Told Pt 911/ED precautions in case of symptoms before call back. Pt stated understanding.   After speaking with Pt the first time I went and spoke with Jari Favre who recommended Pt increase to 1 tablet Metoprolol tartate (25mg ) twice a day and to take BP each day. Called Pt back with recommendations per Jari Favre PA-C. Further instructed Pt on how to take BP readings and 911/ED precautions. Pt stated understanding. Pt also stated she wanted to know if she could take ibuprofen for leg pain at night. She stated it was the only thing that helped. I told Pt I would forward that to be reviewed by our Pharm team and someone would reach out to her to let her know.

## 2023-06-23 ENCOUNTER — Other Ambulatory Visit: Payer: Self-pay | Admitting: Physician Assistant

## 2023-06-23 ENCOUNTER — Ambulatory Visit (HOSPITAL_COMMUNITY)
Admission: RE | Admit: 2023-06-23 | Discharge: 2023-06-23 | Disposition: A | Discharge status: Home / Self Care | Payer: Medicare HMO | Source: Ambulatory Visit | Attending: Cardiology | Admitting: Cardiology

## 2023-06-23 DIAGNOSIS — R030 Elevated blood-pressure reading, without diagnosis of hypertension: Secondary | ICD-10-CM

## 2023-06-23 DIAGNOSIS — R200 Anesthesia of skin: Secondary | ICD-10-CM | POA: Diagnosis not present

## 2023-06-23 DIAGNOSIS — R202 Paresthesia of skin: Secondary | ICD-10-CM | POA: Diagnosis not present

## 2023-06-23 DIAGNOSIS — R000 Tachycardia, unspecified: Secondary | ICD-10-CM

## 2023-06-23 DIAGNOSIS — R609 Edema, unspecified: Secondary | ICD-10-CM

## 2023-06-23 DIAGNOSIS — R079 Chest pain, unspecified: Secondary | ICD-10-CM

## 2023-06-23 LAB — VAS US ABI WITH/WO TBI
Left ABI: 1.03
Right ABI: 1.08

## 2023-06-30 ENCOUNTER — Ambulatory Visit: Payer: Medicare HMO

## 2023-06-30 VITALS — Ht 61.0 in | Wt 129.0 lb

## 2023-06-30 DIAGNOSIS — Z0000 Encounter for general adult medical examination without abnormal findings: Secondary | ICD-10-CM

## 2023-06-30 NOTE — Progress Notes (Addendum)
Subjective:   Hannah Landry is a 77 y.o. female who presents for Medicare Annual (Subsequent) preventive examination.  Visit Complete: Virtual  I connected with  Hannah Landry on 06/30/23 by a audio enabled telemedicine application and verified that I am speaking with the correct person using two identifiers.  Patient Location: Home  Provider Location: Home Office  I discussed the limitations of evaluation and management by telemedicine. The patient expressed understanding and agreed to proceed.  Patient Medicare AWV questionnaire was completed by the patient on 06/27/23 cs; I have confirmed that all information answered by patient is correct and no changes since this date.  Vital Signs: Because this visit was a virtual/telehealth visit, some criteria may be missing or patient reported. Any vitals not documented were not able to be obtained and vitals that have been documented are patient reported.   Review of Systems     Cardiac Risk Factors include: advanced age (>40men, >53 women);dyslipidemia     Objective:    Today's Vitals   06/30/23 1000  Weight: 129 lb (58.5 kg)  Height: 5\' 1"  (1.549 m)   Body mass index is 24.37 kg/m.     06/30/2023   10:16 AM 06/10/2023    8:44 PM 04/21/2023   11:06 AM 04/12/2023    9:44 AM 03/02/2023   11:28 AM 10/04/2022    9:39 AM 05/12/2020    1:29 PM  Advanced Directives  Does Patient Have a Medical Advance Directive? Yes No Yes No No No No  Type of Estate agent of [place];Living will  Healthcare Power of [place];Living will      Does patient want to make changes to medical advance directive? No - Patient declined        Copy of Healthcare Power of Attorney in Chart? Yes - validated most recent copy scanned in chart (See row information)        Would patient like information on creating a medical advance directive?    No - Patient declined   No - Patient declined    Current Medications (verified) Outpatient Encounter  Medications as of 06/30/2023  Medication Sig   acetaminophen (TYLENOL) 325 MG tablet Take 650 mg by mouth every 6 (six) hours as needed.   ALPHAGAN P 0.15 % ophthalmic solution    Calcium Carbonate-Vit D-Min (CALTRATE 600+D PLUS MINERALS) 600-800 MG-UNIT TABS Take by mouth.   Dorzolamide HCl-Timolol Mal PF 2-0.5 % SOLN Apply 1 drop to eye 2 (two) times daily.   fluticasone (CUTIVATE) 0.05 % cream Apply topically 2 (two) times daily. (Patient taking differently: Apply topically as needed.)   folic acid (FOLVITE) 400 MCG tablet Take 400 mcg by mouth daily.     Garlic 1000 MG CAPS Take by mouth.   metoprolol tartrate (LOPRESSOR) 25 MG tablet Take 1 tablet (25 mg total) by mouth 2 (two) times daily.   Multiple Vitamins-Minerals (CENTRUM SILVER ADULT 50+) TABS Take 1 tablet by mouth daily.   valACYclovir (VALTREX) 1000 MG tablet Take 2 tabs now and repeat in 12 hours   No facility-administered encounter medications on file as of 06/30/2023.    Allergies (verified) Patient has no known allergies.   History: Past Medical History:  Diagnosis Date   Anemia    Heart murmur    Hx of adenomatous colonic polyps 09/18/2017   Thalassanemia    Past Surgical History:  Procedure Laterality Date   ABDOMINAL HYSTERECTOMY     CESAREAN SECTION     COLONOSCOPY  Family History  Problem Relation Age of Onset   Colon polyps Mother    Hypertension Mother    Hypertension Father    Colon cancer Neg Hx    Esophageal cancer Neg Hx    Rectal cancer Neg Hx    Stomach cancer Neg Hx    Social History   Socioeconomic History   Marital status: Widowed    Spouse name: Jazzlynne Amis   Number of children: Not on file   Years of education: Not on file   Highest education level: Bachelor's degree (e.g., BA, AB, BS)  Occupational History   Not on file  Tobacco Use   Smoking status: Never   Smokeless tobacco: Never  Substance and Sexual Activity   Alcohol use: No   Drug use: No   Sexual activity: Not  Currently  Other Topics Concern   Not on file  Social History Narrative   Widowed    Retired    Family in [place]    Social Determinants of Health   Financial Resource Strain: Low Risk  (06/27/2023)   Overall Financial Resource Strain (CARDIA)    Difficulty of Paying Living Expenses: Not very hard  Food Insecurity: No Food Insecurity (06/27/2023)   Hunger Vital Sign    Worried About Running Out of Food in the Last Year: Never true    Ran Out of Food in the Last Year: Never true  Transportation Needs: No Transportation Needs (06/27/2023)   PRAPARE - Administrator, Civil Service (Medical): No    Lack of Transportation (Non-Medical): No  Physical Activity: Inactive (06/27/2023)   Exercise Vital Sign    Days of Exercise per Week: 0 days    Minutes of Exercise per Session: 0 min  Stress: No Stress Concern Present (06/27/2023)   [employer] of Occupational Health - Occupational Stress Questionnaire    Feeling of Stress : Only a little  Social Connections: Unknown (06/27/2023)   Social Connection and Isolation Panel [NHANES]    Frequency of Communication with Friends and Family: More than three times a week    Frequency of Social Gatherings with Friends and Family: Once a week    Attends Religious Services: Patient declined    Database administrator or Organizations: No    Attends Banker Meetings: Never    Marital Status: Widowed    Tobacco Counseling Counseling given: Not Answered   Clinical Intake:  Pre-visit preparation completed: Yes  Pain : No/denies pain     Diabetes: No  How often do you need to have someone help you when you read instructions, pamphlets, or other written materials from your doctor or pharmacy?: 1 - Never  Interpreter Needed?: No  Information entered by :: Kandis Fantasia LPN   Activities of Daily Living    06/27/2023   12:46 PM  In your present state of health, do you have any difficulty performing the following  activities:  Vision? 0  Difficulty concentrating or making decisions? 0  Walking or climbing stairs? 0  Dressing or bathing? 0  Doing errands, shopping? 0  Preparing Food and eating ? N  Using the Toilet? N  In the past six months, have you accidently leaked urine? N  Do you have problems with loss of bowel control? N  Managing your Medications? N  Managing your Finances? N  Housekeeping or managing your Housekeeping? N    Patient Care Team: Carney Living, MD as PCP - General Branch, Alben Spittle, MD  as PCP - Cardiology (Cardiology) Drema Dallas, DO as Consulting Physician (Neurology) Mammography, [facility] (Diagnostic Radiology) Si Gaul, MD as Consulting Physician (Oncology) Terri Piedra, DO as Consulting Physician (Dermatology) [facility], [facility]  Indicate any recent Medical Services you may have received from other than [facility] providers in the past year (date may be approximate).     Assessment:   This is a routine wellness examination for A[place].  Hearing/Vision screen Hearing Screening - Comments:: Denies hearing difficulties   Vision Screening - Comments:: Wears rx glasses - up to date with routine eye exams with [facility]    Dietary issues and exercise activities discussed:     Goals Addressed             This Visit's Progress    Remain active and independent        Depression Screen    06/30/2023   10:14 AM 03/02/2023   11:29 AM 10/04/2022    9:39 AM 10/02/2019    8:38 AM 09/25/2018    8:47 AM 09/20/2017    8:32 AM 09/14/2016    8:33 AM  PHQ 2/9 Scores  PHQ - 2 Score 0 0 0 0 0 0 0  PHQ- 9 Score 0 0 0        Fall Risk    06/30/2023   10:15 AM 06/27/2023   12:46 PM 04/21/2023   11:06 AM 04/12/2023    9:44 AM 03/02/2023   11:29 AM  Fall Risk   Falls in the past year? 1  0 0 0  Number falls in past yr: 0 0 0 0   Injury with Fall? 0  0 0   Risk for fall due to : History of fall(s)      Follow up Falls prevention  discussed;Education provided;Falls evaluation completed  Falls evaluation completed      MEDICARE RISK AT HOME:   TIMED UP AND GO:  Was the test performed?  No    Cognitive Function:        06/30/2023   10:15 AM  6CIT Screen  What Year? 0 points  What month? 0 points  What time? 0 points  Count back from 20 0 points  Months in reverse 0 points  Repeat phrase 0 points  Total Score 0 points    Immunizations Immunization History  Administered Date(s) Administered   DTaP 01/22/2004   Fluad Quad(high Dose 65+) 10/02/2019, 09/21/2021, 08/09/2022   Influenza Split 11/21/2012   Influenza,inj,Quad PF,6+ Mos 08/21/2013, 08/27/2014, 09/09/2015, 09/14/2016, 09/20/2017, 09/25/2018, 09/15/2020   Influenza-Unspecified 09/14/2020, 09/21/2021   PFIZER(Purple Top)SARS-COV-2 Vaccination 08/13/2020, 03/12/2021   Pneumococcal Conjugate-13 09/09/2015   Pneumococcal Polysaccharide-23 05/09/2011   Pneumococcal-Unspecified 09/09/2015   Tdap 03/18/2014   Unspecified SARS-COV-2 Vaccination 12/26/2019, 01/16/2020, 08/13/2020, 03/12/2021, 09/09/2021, 08/12/2022   Zoster Recombinant(Shingrix) 11/27/2018, 05/15/2019   Zoster, Live 11/27/2008, 05/15/2019    TDAP status: Up to date  Flu Vaccine status: Due, Education has been provided regarding the importance of this vaccine. Advised may receive this vaccine at local pharmacy or Health Dept. Aware to provide a copy of the vaccination record if obtained from local pharmacy or Health Dept. Verbalized acceptance and understanding.  Pneumococcal vaccine status: Up to date  Covid-19 vaccine status: Information provided on how to obtain vaccines.   Qualifies for Shingles Vaccine? Yes   Zostavax completed No   Shingrix Completed?: Yes  Screening Tests Health Maintenance  Topic Date Due   Colonoscopy  09/12/2022   COVID-19 Vaccine (9 -  2023-24 season) 10/07/2022   INFLUENZA VACCINE  06/15/2023   DTaP/Tdap/Td (3 - Td or Tdap) 03/18/2024    Medicare Annual Wellness (AWV)  06/29/2024   Pneumonia Vaccine 33+ Years old  Completed   DEXA SCAN  Completed   Hepatitis C Screening  Completed   Zoster Vaccines- Shingrix  Completed   HPV VACCINES  Aged Out    Health Maintenance  Health Maintenance Due  Topic Date Due   Colonoscopy  09/12/2022   COVID-19 Vaccine (9 - 2023-24 season) 10/07/2022   INFLUENZA VACCINE  06/15/2023    Colorectal cancer screening: No longer required.   Mammogram status: Completed 07/04/22. Repeat every year  Bone Density status: Completed 04/29/22. Results reflect: Bone density results: OSTEOPENIA. Repeat every 2 years.  Lung Cancer Screening: (Low Dose CT Chest recommended if Age 45-80 years, 20 pack-year currently smoking OR have quit w/in 15years.) does not qualify.   Lung Cancer Screening Referral: n/a  Additional Screening:  Hepatitis C Screening: does qualify; Completed 09/09/15  Vision Screening: Recommended annual ophthalmology exams for early detection of glaucoma and other disorders of the eye. Is the patient up to date with their annual eye exam?  Yes  Who is the provider or what is the name of the office in which the patient attends annual eye exams? [facility]  If pt is not established with a provider, would they like to be referred to a provider to establish care? No .   Dental Screening: Recommended annual dental exams for proper oral hygiene  Community Resource Referral / Chronic Care Management: CRR required this visit?  No   CCM required this visit?  No     Plan:     I have personally reviewed and noted the following in the patient's chart:   Medical and social history Use of alcohol, tobacco or illicit drugs  Current medications and supplements including opioid prescriptions. Patient is not currently taking opioid prescriptions. Functional ability and status Nutritional status Physical activity Advanced directives List of other physicians Hospitalizations,  surgeries, and ER visits in previous 12 months Vitals Screenings to include cognitive, depression, and falls Referrals and appointments  In addition, I have reviewed and discussed with patient certain preventive protocols, quality metrics, and best practice recommendations. A written personalized care plan for preventive services as well as general preventive health recommendations were provided to patient.     Kandis Fantasia [place], [place]   1/61/0960   After Visit Summary: (MyChart) Due to this being a telephonic visit, the after visit summary with patients personalized plan was offered to patient via MyChart   Nurse Notes: No concerns at this time       I have reviewed this visit and agree with the documentation.  Marshall L Chambliss

## 2023-06-30 NOTE — Patient Instructions (Addendum)
Hannah Landry , Thank you for taking time to come for your Medicare Wellness Visit. I appreciate your ongoing commitment to your health goals. Please review the following plan we discussed and let me know if I can assist you in the future.   Referrals/Orders/Follow-Ups/Clinician Recommendations: Aim for 30 minutes of exercise or brisk walking, 6-8 glasses of water, and 5 servings of fruits and vegetables each day.  This is a list of the screening recommended for you and due dates:  Health Maintenance  Topic Date Due   Colon Cancer Screening  09/12/2022   COVID-19 Vaccine (9 - 2023-24 season) 10/07/2022   Flu Shot  06/15/2023   DTaP/Tdap/Td vaccine (3 - Td or Tdap) 03/18/2024   Medicare Annual Wellness Visit  06/29/2024   Pneumonia Vaccine  Completed   DEXA scan (bone density measurement)  Completed   Hepatitis C Screening  Completed   Zoster (Shingles) Vaccine  Completed   HPV Vaccine  Aged Out    Advanced directives: (In Chart) A copy of your advanced directives are scanned into your chart should your provider ever need it.  Next Medicare Annual Wellness Visit scheduled for next year: Yes  Preventive Care 44 Years and Older, Female Preventive care refers to lifestyle choices and visits with your health care provider that can promote health and wellness. What does preventive care include? A yearly physical exam. This is also called an annual well check. Dental exams once or twice a year. Routine eye exams. Ask your health care provider how often you should have your eyes checked. Personal lifestyle choices, including: Daily care of your teeth and gums. Regular physical activity. Eating a healthy diet. Avoiding tobacco and drug use. Limiting alcohol use. Practicing safe sex. Taking low-dose aspirin every day. Taking vitamin and mineral supplements as recommended by your health care provider. What happens during an annual well check? The services and screenings done by your health  care provider during your annual well check will depend on your age, overall health, lifestyle risk factors, and family history of disease. Counseling  Your health care provider may ask you questions about your: Alcohol use. Tobacco use. Drug use. Emotional well-being. Home and relationship well-being. Sexual activity. Eating habits. History of falls. Memory and ability to understand (cognition). Work and work Astronomer. Reproductive health. Screening  You may have the following tests or measurements: Height, weight, and BMI. Blood pressure. Lipid and cholesterol levels. These may be checked every 5 years, or more frequently if you are over 60 years old. Skin check. Lung cancer screening. You may have this screening every year starting at age 50 if you have a 30-pack-year history of smoking and currently smoke or have quit within the past 15 years. Fecal occult blood test (FOBT) of the stool. You may have this test every year starting at age 43. Flexible sigmoidoscopy or colonoscopy. You may have a sigmoidoscopy every 5 years or a colonoscopy every 10 years starting at age 53. Hepatitis C blood test. Hepatitis B blood test. Sexually transmitted disease (STD) testing. Diabetes screening. This is done by checking your blood sugar (glucose) after you have not eaten for a while (fasting). You may have this done every 1-3 years. Bone density scan. This is done to screen for osteoporosis. You may have this done starting at age 47. Mammogram. This may be done every 1-2 years. Talk to your health care provider about how often you should have regular mammograms. Talk with your health care provider about your test results,  treatment options, and if necessary, the need for more tests. Vaccines  Your health care provider may recommend certain vaccines, such as: Influenza vaccine. This is recommended every year. Tetanus, diphtheria, and acellular pertussis (Tdap, Td) vaccine. You may need a Td  booster every 10 years. Zoster vaccine. You may need this after age 7. Pneumococcal 13-valent conjugate (PCV13) vaccine. One dose is recommended after age 68. Pneumococcal polysaccharide (PPSV23) vaccine. One dose is recommended after age 61. Talk to your health care provider about which screenings and vaccines you need and how often you need them. This information is not intended to replace advice given to you by your health care provider. Make sure you discuss any questions you have with your health care provider. Document Released: 11/27/2015 Document Revised: 07/20/2016 Document Reviewed: 09/01/2015 Elsevier Interactive Patient Education  2017 [employer].  Fall Prevention in the Home Falls can cause injuries. They can happen to people of all ages. There are many things you can do to make your home safe and to help prevent falls. What can I do on the outside of my home? Regularly fix the edges of walkways and driveways and fix any cracks. Remove anything that might make you trip as you walk through a door, such as a raised step or threshold. Trim any bushes or trees on the path to your home. Use bright outdoor lighting. Clear any walking paths of anything that might make someone trip, such as rocks or tools. Regularly check to see if handrails are loose or broken. Make sure that both sides of any steps have handrails. Any raised decks and porches should have guardrails on the edges. Have any leaves, snow, or ice cleared regularly. Use sand or salt on walking paths during winter. Clean up any spills in your garage right away. This includes oil or grease spills. What can I do in the bathroom? Use night lights. Install grab bars by the toilet and in the tub and shower. Do not use towel bars as grab bars. Use non-skid mats or decals in the tub or shower. If you need to sit down in the shower, use a plastic, non-slip stool. Keep the floor dry. Clean up any water that spills on the floor  as soon as it happens. Remove soap buildup in the tub or shower regularly. Attach bath mats securely with double-sided non-slip rug tape. Do not have throw rugs and other things on the floor that can make you trip. What can I do in the bedroom? Use night lights. Make sure that you have a light by your bed that is easy to reach. Do not use any sheets or blankets that are too big for your bed. They should not hang down onto the floor. Have a firm chair that has side arms. You can use this for support while you get dressed. Do not have throw rugs and other things on the floor that can make you trip. What can I do in the kitchen? Clean up any spills right away. Avoid walking on wet floors. Keep items that you use a lot in easy-to-reach places. If you need to reach something above you, use a strong step stool that has a grab bar. Keep electrical cords out of the way. Do not use floor polish or wax that makes floors slippery. If you must use wax, use non-skid floor wax. Do not have throw rugs and other things on the floor that can make you trip. What can I do with my stairs? Do  not leave any items on the stairs. Make sure that there are handrails on both sides of the stairs and use them. Fix handrails that are broken or loose. Make sure that handrails are as long as the stairways. Check any carpeting to make sure that it is firmly attached to the stairs. Fix any carpet that is loose or worn. Avoid having throw rugs at the top or bottom of the stairs. If you do have throw rugs, attach them to the floor with carpet tape. Make sure that you have a light switch at the top of the stairs and the bottom of the stairs. If you do not have them, ask someone to add them for you. What else can I do to help prevent falls? Wear shoes that: Do not have high heels. Have rubber bottoms. Are comfortable and fit you well. Are closed at the toe. Do not wear sandals. If you use a stepladder: Make sure that it is  fully opened. Do not climb a closed stepladder. Make sure that both sides of the stepladder are locked into place. Ask someone to hold it for you, if possible. Clearly mark and make sure that you can see: Any grab bars or handrails. First and last steps. Where the edge of each step is. Use tools that help you move around (mobility aids) if they are needed. These include: Canes. Walkers. Scooters. Crutches. Turn on the lights when you go into a dark area. Replace any light bulbs as soon as they burn out. Set up your furniture so you have a clear path. Avoid moving your furniture around. If any of your floors are uneven, fix them. If there are any pets around you, be aware of where they are. Review your medicines with your doctor. Some medicines can make you feel dizzy. This can increase your chance of falling. Ask your doctor what other things that you can do to help prevent falls. This information is not intended to replace advice given to you by your health care provider. Make sure you discuss any questions you have with your health care provider. Document Released: 08/27/2009 Document Revised: 04/07/2016 Document Reviewed: 12/05/2014 Elsevier Interactive Patient Education  2017 [employer].

## 2023-07-04 ENCOUNTER — Ambulatory Visit (HOSPITAL_COMMUNITY): Payer: Medicare HMO | Attending: Physician Assistant

## 2023-07-04 DIAGNOSIS — R079 Chest pain, unspecified: Secondary | ICD-10-CM | POA: Diagnosis not present

## 2023-07-04 DIAGNOSIS — R609 Edema, unspecified: Secondary | ICD-10-CM | POA: Diagnosis not present

## 2023-07-04 DIAGNOSIS — R030 Elevated blood-pressure reading, without diagnosis of hypertension: Secondary | ICD-10-CM | POA: Diagnosis present

## 2023-07-04 DIAGNOSIS — I080 Rheumatic disorders of both mitral and aortic valves: Secondary | ICD-10-CM

## 2023-07-04 LAB — ECHOCARDIOGRAM COMPLETE
Area-P 1/2: 4.6 cm2
S' Lateral: 2.8 cm

## 2023-07-12 ENCOUNTER — Ambulatory Visit: Payer: 59 | Admitting: Neurology

## 2023-07-18 ENCOUNTER — Ambulatory Visit (INDEPENDENT_AMBULATORY_CARE_PROVIDER_SITE_OTHER): Payer: Medicare HMO | Admitting: Family Medicine

## 2023-07-18 ENCOUNTER — Encounter: Payer: Self-pay | Admitting: Family Medicine

## 2023-07-18 ENCOUNTER — Other Ambulatory Visit: Payer: Self-pay

## 2023-07-18 VITALS — BP 129/65 | HR 64 | Ht 61.0 in | Wt 132.0 lb

## 2023-07-18 DIAGNOSIS — R202 Paresthesia of skin: Secondary | ICD-10-CM

## 2023-07-18 DIAGNOSIS — R030 Elevated blood-pressure reading, without diagnosis of hypertension: Secondary | ICD-10-CM | POA: Diagnosis not present

## 2023-07-18 DIAGNOSIS — G4700 Insomnia, unspecified: Secondary | ICD-10-CM

## 2023-07-18 DIAGNOSIS — E785 Hyperlipidemia, unspecified: Secondary | ICD-10-CM | POA: Diagnosis not present

## 2023-07-18 NOTE — Progress Notes (Signed)
    SUBJECTIVE:   CHIEF COMPLAINT / HPI:   Leg Discomfort Still bothering her some.  Did not feel gabapentin helped.  Wearing compression stockings and staying active.  Is not progressing and no upper extremity symptoms.  Arterial dopplers, echo and chest CT were normal    Insomnia Grief  Accompanied by her daughter today.  Plans to visit her in [place] later in the year.  Back going to church.   Exercising. Taking trazodone as needed.  Does often fall asleep watching TV in evenings  Headache Has improved.  Is off all medications     OBJECTIVE:   BP 129/65   Pulse 64   Ht 5\' 1"  (1.549 m)   Wt 132 lb (59.9 kg)   SpO2 100%   BMI 24.94 kg/m   Healthy appearing Legs - mild varicose and spider veins.  No edema.  Normal gait   ASSESSMENT/PLAN:   Paresthesias Assessment & Plan: Continue to concern her.  No progression.  Unsure of cause.  She plans to follow up with Dr Everlena Cooper.  Suggested sequential trials of capsicin or diclofenac topicals    Elevated blood pressure reading Assessment & Plan: Taking lopressor twice a day.  Her blood pressure logs show pulses in 50-60s with systolics in 100s -120s.  No significant lightheadness    Hyperlipidemia, unspecified hyperlipidemia type -     Lipid panel; Future  Insomnia, unspecified type Assessment & Plan: Partially mood related and partially age.  We discussed goal is to get about 7-8 hours total sleep per day and if she falls asleep in early evening then she will awaken in early AM.   Recommend using trazodone only as needed       Patient Instructions  Good to see you today - Thank you for coming in  Things we discussed today:  For the leg tingling - Capsacin (Zostrix)  - Diclofenac  Try each for at least a week   Call for a lab visit in November for cholesterol    Carney Living, MD [facility]

## 2023-07-18 NOTE — Assessment & Plan Note (Signed)
Taking lopressor twice a day.  Her blood pressure logs show pulses in 50-60s with systolics in 100s -120s.  No significant lightheadness

## 2023-07-18 NOTE — Assessment & Plan Note (Signed)
Continue to concern her.  No progression.  Unsure of cause.  She plans to follow up with Dr Everlena Cooper.  Suggested sequential trials of capsicin or diclofenac topicals

## 2023-07-18 NOTE — Patient Instructions (Signed)
Good to see you today - Thank you for coming in  Things we discussed today:  For the leg tingling - Capsacin (Zostrix)  - Diclofenac  Try each for at least a week   Call for a lab visit in November for cholesterol

## 2023-07-18 NOTE — Assessment & Plan Note (Signed)
Partially mood related and partially age.  We discussed goal is to get about 7-8 hours total sleep per day and if she falls asleep in early evening then she will awaken in early AM.   Recommend using trazodone only as needed

## 2023-07-19 NOTE — Progress Notes (Unsigned)
Cardiology Office Note:  .   Date:  07/20/2023  ID:  Hannah Landry, DOB 10/15/1946, MRN [id number] PCP: Carney Living, MD  [facility] Providers Cardiologist:  Maisie Fus, MD {  History of Present Illness: .   Hannah Landry is a 77 y.o. female with a past medical history of hyperlipidemia, thalassemia who comes in for follow-up appointment.  Episode of chest discomfort that sent her to the ED.  She described the chest pain as tight, pressure feeling.  Present with movement and standing, better with sitting but not worse with walking.  Mild associated nausea but no vomiting.  Denied dyspnea and diaphoresis.  Symptoms lasted about 2 hours before resolving.  Had been symptom free since about 6 PM the day of ED arrival.  Did check blood pressure which was up to 180 systolic.  Typically blood pressure runs 1 25-1 35 systolic.  Heart rate is also increased up to 90 which was high for her.  Blood pressure did come down and heart rate did come down after symptoms resolved.  She does admit that for the last several weeks she had more fatigued than usual.  Recently been evaluated by cardiology for chest pain but stated that it was different at that time.  Concerned that she has some numbness in her legs which her neurologist felt was neuropathy.  Given a prescription for gabapentin but she stopped due to side effects, switched to duloxetine which also stopped because of side effects and her husband passed away about 3 months prior.  Was taking vitamin B12 and D but blood levels reported to be high so she was told to stop taking it.  Non-smoker and no history of diabetes or hypertension and no family history of premature coronary atherosclerosis.  Not on any medications for hyperlipidemia.  I saw her in the clinic 06/12/2023, she states her neurologist, started gabapentin, nausea and didn't feel well, took duloxetine-stopped that one too nausea. Ibuprofen does help. No pain with walking. Weakness  with the tingling in her legs and tops of her feet. Never feel like her feet are cold. Good pulses. He says her excellerated heart rate is when she is moving around. Up to 111 bpm which is unusual for her. She felt the chest tightness this morning, saturaday it started when pressure when up to 180 mmhg. HR and BP usually up at the same time. No SOB, no lightheadedness or dizziness, no racing of her HR. 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 Tingling more in the daytime, ibuprofen helps most of the time. We encouraged water intake and salty snacks to keep her BP elevated. She is willing to try some metoprolol to try and control her HR a little better. She also has a growth on the top of her mouth, recommended follow-up with PCP.  Today, she tells me that she has been doing much better from a blood pressure standpoint.  Brought in a log that dated back to the end of July.  The last few weeks of blood pressures have looked significantly better.  Heart rate occasionally drops in the 50s but asymptomatic.  Recommended continuing current beta-blocker dose at 25 mg twice a day.  She states that she is still struggling with some neuropathy and was at 1 time prescribed 100 mg of gabapentin at night.  She has not been taking this because she did not tolerate it well in the past.  We have encouraged her to discuss with her  primary care.  There are no contraindications with this medication from a heart standpoint.  Reports no shortness of breath nor dyspnea on exertion. Reports no chest pain, pressure, or tightness. No edema, orthopnea, PND. Reports no palpitations.    ROS: pertinent ROS in HPI  Studies Reviewed: .       Cardiac MRI 9/31/21 IMPRESSION: 1. No evidence of cardiac iron overload, with normal myocardial T2* (42 ms)   2.  Normal LV size, wall thickness, and systolic function (EF 62%)   3.  Normal RV size and systolic function (EF 61 %)   4.  No late gadolinium enhancement to  suggest myocardial scar      Physical Exam:   VS:  BP (!) 142/64   Pulse 62   Ht 5\' 1"  (1.549 m)   Wt 131 lb 12.8 oz (59.8 kg)   SpO2 98%   BMI 24.90 kg/m    Wt Readings from Last 3 Encounters:  07/20/23 131 lb 12.8 oz (59.8 kg)  07/18/23 132 lb (59.9 kg)  06/30/23 129 lb (58.5 kg)    GEN: Well nourished, well developed in no acute distress NECK: No JVD; No carotid bruits CARDIAC: RRR, no murmurs, rubs, gallops RESPIRATORY:  Clear to auscultation without rales, wheezing or rhonchi  ABDOMEN: Soft, non-tender, non-distended EXTREMITIES:  No edema; No deformity   ASSESSMENT AND PLAN: .   1.  Chest pain -Troponin normal in the ED.  EKG unchanged from prior. -will order an echo for further workup  2. Inappropriate tachycardia /POTs like symptoms -salty snacks -echo -increase water intake -limit alcohol and caffeine -continue metoprolol 25mg  BID  3. LE tingling/neuropathy ruled out -ABIs were reviewed without any significant arterial disease in her lower legs -good pulses today, some superficial veins and possible varicosities -asked her to wear LE compression hose/socks   -neuropathy-gabapentin per PCP    Dispo: She can follow-up in 6 months with me or Dr. Wyline Mood  Signed, Sharlene Dory, PA-C

## 2023-07-20 ENCOUNTER — Encounter: Payer: Self-pay | Admitting: Physician Assistant

## 2023-07-20 ENCOUNTER — Ambulatory Visit: Payer: Medicare HMO | Attending: Physician Assistant | Admitting: Physician Assistant

## 2023-07-20 VITALS — BP 142/64 | HR 62 | Ht 61.0 in | Wt 131.8 lb

## 2023-07-20 DIAGNOSIS — R079 Chest pain, unspecified: Secondary | ICD-10-CM | POA: Diagnosis not present

## 2023-07-20 DIAGNOSIS — R030 Elevated blood-pressure reading, without diagnosis of hypertension: Secondary | ICD-10-CM

## 2023-07-20 DIAGNOSIS — R000 Tachycardia, unspecified: Secondary | ICD-10-CM | POA: Diagnosis not present

## 2023-07-20 DIAGNOSIS — R609 Edema, unspecified: Secondary | ICD-10-CM | POA: Diagnosis not present

## 2023-07-20 DIAGNOSIS — R202 Paresthesia of skin: Secondary | ICD-10-CM

## 2023-07-20 DIAGNOSIS — R200 Anesthesia of skin: Secondary | ICD-10-CM

## 2023-07-20 NOTE — Patient Instructions (Addendum)
Medication Instructions:   Your physician recommends that you continue on your current medications as directed. Please refer to the Current Medication list given to you today.  If you need a refill on your cardiac medications before your next appointment, please call your pharmacy*   Lab Work: NONE ORDERED  TODAY   If you have labs (blood work) drawn today and your tests are completely normal, you will receive your results only by: MyChart Message (if you have MyChart) OR A paper copy in the mail If you have any lab test that is abnormal or we need to change your treatment, we will call you to review the results.   Testing/Procedures: NONE ORDERED  TODAY     Follow-Up: At [facility], you and your health needs are our priority.  As part of our continuing mission to provide you with exceptional heart care, we have created designated Provider Care Teams.  These Care Teams include your primary Cardiologist (physician) and Advanced Practice Providers (APPs -  Physician Assistants and Nurse Practitioners) who all work together to provide you with the care you need, when you need it.  We recommend signing up for the patient portal called "MyChart".  Sign up information is provided on this After Visit Summary.  MyChart is used to connect with patients for Virtual Visits (Telemedicine).  Patients are able to view lab/test results, encounter notes, upcoming appointments, etc.  Non-urgent messages can be sent to your provider as well.   To learn more about what you can do with MyChart, go to [email or web address].    Your next appointment:   6 month(s)  Provider:   You may see Maisie Fus, MD or one of the following Advanced Practice Providers on your designated Care Team:     Other Instructions:  Low-Sodium Eating Plan Salt (sodium) helps you keep a healthy balance of fluids in your body. Too much sodium can raise your blood pressure. It can also cause fluid and waste to be  held in your body. Your health care provider or dietitian may recommend a low-sodium eating plan if you have high blood pressure (hypertension), kidney disease, liver disease, or heart failure. Eating less sodium can help lower your blood pressure and reduce swelling. It can also protect your heart, liver, and kidneys. What are tips for following this plan? Reading food labels  Check food labels for the amount of sodium per serving. If you eat more than one serving, you must multiply the listed amount by the number of servings. Choose foods with less than 140 milligrams (mg) of sodium per serving. Avoid foods with 300 mg of sodium or more per serving. Always check how much sodium is in a product, even if the label says "unsalted" or "no salt added." Shopping  Buy products labeled as "low-sodium" or "no salt added." Buy fresh foods. Avoid canned foods and pre-made or frozen meals. Avoid canned, cured, or processed meats. Buy breads that have less than 80 mg of sodium per slice. Cooking  Eat more home-cooked food. Try to eat less restaurant, buffet, and fast food. Try not to add salt when you cook. Use salt-free seasonings or herbs instead of table salt or sea salt. Check with your provider or pharmacist before using salt substitutes. Cook with plant-based oils, such as canola, sunflower, or olive oil. Meal planning When eating at a restaurant, ask if your food can be made with less salt or no salt. Avoid dishes labeled as brined, pickled, cured, or smoked.  Avoid dishes made with soy sauce, miso, or teriyaki sauce. Avoid foods that have monosodium glutamate (MSG) in them. MSG may be added to some restaurant food, sauces, soups, bouillon, and canned foods. Make meals that can be grilled, baked, poached, roasted, or steamed. These are often made with less sodium. General information Try to limit your sodium intake to 1,500-2,300 mg each day, or the amount told by your provider. What foods  should I eat? Fruits Fresh, frozen, or canned fruit. Fruit juice. Vegetables Fresh or frozen vegetables. "No salt added" canned vegetables. "No salt added" tomato sauce and paste. Low-sodium or reduced-sodium tomato and vegetable juice. Grains Low-sodium cereals, such as oats, puffed wheat and rice, and shredded wheat. Low-sodium crackers. Unsalted rice. Unsalted pasta. Low-sodium bread. Whole grain breads and whole grain pasta. Meats and other proteins Fresh or frozen meat, poultry, seafood, and fish. These should have no added salt. Low-sodium canned tuna and salmon. Unsalted nuts. Dried peas, beans, and lentils without added salt. Unsalted canned beans. Eggs. Unsalted nut butters. Dairy Milk. Soy milk. Cheese that is naturally low in sodium, such as ricotta cheese, fresh mozzarella, or Swiss cheese. Low-sodium or reduced-sodium cheese. Cream cheese. Yogurt. Seasonings and condiments Fresh and dried herbs and spices. Salt-free seasonings. Low-sodium mustard and ketchup. Sodium-free salad dressing. Sodium-free light mayonnaise. Fresh or refrigerated horseradish. Lemon juice. Vinegar. Other foods Homemade, reduced-sodium, or low-sodium soups. Unsalted popcorn and pretzels. Low-salt or salt-free chips. The items listed above may not be all the foods and drinks you can have. Talk to a dietitian to learn more. What foods should I avoid? Vegetables Sauerkraut, pickled vegetables, and relishes. Olives. [place] fries. Onion rings. Regular canned vegetables, except low-sodium or reduced-sodium items. Regular canned tomato sauce and paste. Regular tomato and vegetable juice. Frozen vegetables in sauces. Grains Instant hot cereals. Bread stuffing, pancake, and biscuit mixes. Croutons. Seasoned rice or pasta mixes. Noodle soup cups. Boxed or frozen macaroni and cheese. Regular salted crackers. Self-rising flour. Meats and other proteins Meat or fish that is salted, canned, smoked, spiced, or pickled.  Precooked or cured meat, such as sausages or meat loaves. Hannah Landry. Ham. Pepperoni. Hot dogs. Corned beef. Chipped beef. Salt pork. Jerky. Pickled herring, anchovies, and sardines. Regular canned tuna. Salted nuts. Dairy Processed cheese and cheese spreads. Hard cheeses. Cheese curds. Blue cheese. Feta cheese. String cheese. Regular cottage cheese. Buttermilk. Canned milk. Fats and oils Salted butter. Regular margarine. Ghee. Bacon fat. Seasonings and condiments Onion salt, garlic salt, seasoned salt, table salt, and sea salt. Canned and packaged gravies. Worcestershire sauce. Tartar sauce. Barbecue sauce. Teriyaki sauce. Soy sauce, including reduced-sodium soy sauce. Steak sauce. Fish sauce. Oyster sauce. Cocktail sauce. Horseradish that you find on the shelf. Regular ketchup and mustard. Meat flavorings and tenderizers. Bouillon cubes. Hot sauce. Pre-made or packaged marinades. Pre-made or packaged taco seasonings. Relishes. Regular salad dressings. Salsa. Other foods Salted popcorn and pretzels. Corn chips and puffs. Potato and tortilla chips. Canned or dried soups. Pizza. Frozen entrees and pot pies. The items listed above may not be all the foods and drinks you should avoid. Talk to a dietitian to learn more. This information is not intended to replace advice given to you by your health care provider. Make sure you discuss any questions you have with your health care provider. Document Revised: 11/17/2022 Document Reviewed: 11/17/2022 Elsevier Patient Education  2024 [employer]  Heart-Healthy Eating Plan Eating a healthy diet is important for the health of your heart. A heart-healthy eating plan includes:  Eating less unhealthy fats. Eating more healthy fats. Eating less salt in your food. Salt is also called sodium. Making other changes in your diet. Talk with your doctor or a diet specialist (dietitian) to create an eating plan that is right for you. What is my plan? Your doctor may  recommend an eating plan that includes: Total fat: ______% or less of total calories a day. Saturated fat: ______% or less of total calories a day. Cholesterol: less than _________mg a day. Sodium: less than _________mg a day. What are tips for following this plan? Cooking Avoid frying your food. Try to bake, boil, grill, or broil it instead. You can also reduce fat by: Removing the skin from poultry. Removing all visible fats from meats. Steaming vegetables in water or broth. Meal planning  At meals, divide your plate into four equal parts: Fill one-half of your plate with vegetables and green salads. Fill one-fourth of your plate with whole grains. Fill one-fourth of your plate with lean protein foods. Eat 2-4 cups of vegetables per day. One cup of vegetables is: 1 cup (91 g) broccoli or cauliflower florets. 2 medium carrots. 1 large bell pepper. 1 large sweet potato. 1 large tomato. 1 medium white potato. 2 cups (150 g) raw leafy greens. Eat 1-2 cups of fruit per day. One cup of fruit is: 1 small apple 1 large banana 1 cup (237 g) mixed fruit, 1 large orange,  cup (82 g) dried fruit, 1 cup (240 mL) 100% fruit juice. Eat more foods that have soluble fiber. These are apples, broccoli, carrots, beans, peas, and barley. Try to get 20-30 g of fiber per day. Eat 4-5 servings of nuts, legumes, and seeds per week: 1 serving of dried beans or legumes equals  cup (90 g) cooked. 1 serving of nuts is  oz (12 almonds, 24 pistachios, or 7 walnut halves). 1 serving of seeds equals  oz (8 g). General information Eat more home-cooked food. Eat less restaurant, buffet, and fast food. Limit or avoid alcohol. Limit foods that are high in starch and sugar. Avoid fried foods. Lose weight if you are overweight. Keep track of how much salt (sodium) you eat. This is important if you have high blood pressure. Ask your doctor to tell you more about this. Try to add vegetarian meals each  week. Fats Choose healthy fats. These include olive oil and canola oil, flaxseeds, walnuts, almonds, and seeds. Eat more omega-3 fats. These include salmon, mackerel, sardines, tuna, flaxseed oil, and ground flaxseeds. Try to eat fish at least 2 times each week. Check food labels. Avoid foods with trans fats or high amounts of saturated fat. Limit saturated fats. These are often found in animal products, such as meats, butter, and cream. These are also found in plant foods, such as palm oil, palm kernel oil, and coconut oil. Avoid foods with partially hydrogenated oils in them. These have trans fats. Examples are stick margarine, some tub margarines, cookies, crackers, and other baked goods. What foods should I eat? Fruits All fresh, canned (in natural juice), or frozen fruits. Vegetables Fresh or frozen vegetables (raw, steamed, roasted, or grilled). Green salads. Grains Most grains. Choose whole wheat and whole grains most of the time. Rice and pasta, including brown rice and pastas made with whole wheat. Meats and other proteins Lean, well-trimmed beef, veal, pork, and lamb. Chicken and [place] without skin. All fish and shellfish. Wild duck, rabbit, pheasant, and venison. Egg whites or low-cholesterol egg substitutes. Dried beans, peas,  lentils, and tofu. Seeds and most nuts. Dairy Low-fat or nonfat cheeses, including ricotta and mozzarella. Skim or 1% milk that is liquid, powdered, or evaporated. Buttermilk that is made with low-fat milk. Nonfat or low-fat yogurt. Fats and oils Non-hydrogenated (trans-free) margarines. Vegetable oils, including soybean, sesame, sunflower, olive, peanut, safflower, corn, canola, and cottonseed. Salad dressings or mayonnaise made with a vegetable oil. Beverages Mineral water. Coffee and tea. Diet carbonated beverages. Sweets and desserts Sherbet, gelatin, and fruit ice. Small amounts of dark chocolate. Limit all sweets and desserts. Seasonings and  condiments All seasonings and condiments. The items listed above may not be a complete list of foods and drinks you can eat. Contact a dietitian for more options. What foods should I avoid? Fruits Canned fruit in heavy syrup. Fruit in cream or butter sauce. Fried fruit. Limit coconut. Vegetables Vegetables cooked in cheese, cream, or butter sauce. Fried vegetables. Grains Breads that are made with saturated or trans fats, oils, or whole milk. Croissants. Sweet rolls. Donuts. High-fat crackers, such as cheese crackers. Meats and other proteins Fatty meats, such as hot dogs, ribs, sausage, bacon, rib-eye roast or steak. High-fat deli meats, such as salami and bologna. Caviar. Domestic duck and goose. Organ meats, such as liver. Dairy Cream, sour cream, cream cheese, and creamed cottage cheese. Whole-milk cheeses. Whole or 2% milk that is liquid, evaporated, or condensed. Whole buttermilk. Cream sauce or high-fat cheese sauce. Yogurt that is made from whole milk. Fats and oils Meat fat, or shortening. Cocoa butter, hydrogenated oils, palm oil, coconut oil, palm kernel oil. Solid fats and shortenings, including bacon fat, salt pork, lard, and butter. Nondairy cream substitutes. Salad dressings with cheese or sour cream. Beverages Regular sodas and juice drinks with added sugar. Sweets and desserts Frosting. Pudding. Cookies. Cakes. Pies. Milk chocolate or white chocolate. Buttered syrups. Full-fat ice cream or ice cream drinks. The items listed above may not be a complete list of foods and drinks to avoid. Contact a dietitian for more information. Summary Heart-healthy meal planning includes eating less unhealthy fats, eating more healthy fats, and making other changes in your diet. Eat a balanced diet. This includes fruits and vegetables, low-fat or nonfat dairy, lean protein, nuts and legumes, whole grains, and heart-healthy oils and fats. This information is not intended to replace advice  given to you by your health care provider. Make sure you discuss any questions you have with your health care provider. Document Revised: 12/06/2021 Document Reviewed: 12/06/2021 Elsevier Patient Education  2024 [employer].

## 2023-07-25 ENCOUNTER — Ambulatory Visit: Payer: Self-pay | Admitting: Neurology

## 2023-07-26 ENCOUNTER — Encounter: Payer: Self-pay | Admitting: Family Medicine

## 2023-07-27 ENCOUNTER — Inpatient Hospital Stay: Payer: Medicare HMO | Admitting: Internal Medicine

## 2023-07-27 ENCOUNTER — Inpatient Hospital Stay: Payer: Medicare HMO | Attending: Internal Medicine

## 2023-07-27 VITALS — BP 126/78 | HR 60 | Temp 98.2°F | Resp 16 | Ht 61.0 in | Wt 127.6 lb

## 2023-07-27 DIAGNOSIS — Z79899 Other long term (current) drug therapy: Secondary | ICD-10-CM | POA: Diagnosis not present

## 2023-07-27 DIAGNOSIS — D539 Nutritional anemia, unspecified: Secondary | ICD-10-CM

## 2023-07-27 DIAGNOSIS — D563 Thalassemia minor: Secondary | ICD-10-CM | POA: Diagnosis present

## 2023-07-27 DIAGNOSIS — G629 Polyneuropathy, unspecified: Secondary | ICD-10-CM | POA: Insufficient documentation

## 2023-07-27 LAB — IRON AND IRON BINDING CAPACITY (CC-WL,HP ONLY)
Iron: 100 ug/dL (ref 28–170)
Saturation Ratios: 29 % (ref 10.4–31.8)
TIBC: 349 ug/dL (ref 250–450)
UIBC: 249 ug/dL (ref 148–442)

## 2023-07-27 LAB — CBC WITH DIFFERENTIAL (CANCER CENTER ONLY)
Abs Immature Granulocytes: 0.02 10*3/uL (ref 0.00–0.07)
Basophils Absolute: 0 10*3/uL (ref 0.0–0.1)
Basophils Relative: 0 %
Eosinophils Absolute: 0.1 10*3/uL (ref 0.0–0.5)
Eosinophils Relative: 1 %
HCT: 33.1 % — ABNORMAL LOW (ref 36.0–46.0)
Hemoglobin: 10.3 g/dL — ABNORMAL LOW (ref 12.0–15.0)
Immature Granulocytes: 0 %
Lymphocytes Relative: 14 %
Lymphs Abs: 1.2 10*3/uL (ref 0.7–4.0)
MCH: 17.9 pg — ABNORMAL LOW (ref 26.0–34.0)
MCHC: 31.1 g/dL (ref 30.0–36.0)
MCV: 57.4 fL — ABNORMAL LOW (ref 80.0–100.0)
Monocytes Absolute: 0.4 10*3/uL (ref 0.1–1.0)
Monocytes Relative: 5 %
Neutro Abs: 6.9 10*3/uL (ref 1.7–7.7)
Neutrophils Relative %: 80 %
Platelet Count: 254 10*3/uL (ref 150–400)
RBC: 5.77 MIL/uL — ABNORMAL HIGH (ref 3.87–5.11)
RDW: 19.6 % — ABNORMAL HIGH (ref 11.5–15.5)
WBC Count: 8.7 10*3/uL (ref 4.0–10.5)
nRBC: 0.8 % — ABNORMAL HIGH (ref 0.0–0.2)

## 2023-07-27 LAB — FOLATE: Folate: 24.3 ng/mL (ref >5.9)

## 2023-07-27 LAB — VITAMIN B12: Vitamin B-12: 536 pg/mL (ref 180–914)

## 2023-07-27 LAB — FERRITIN: Ferritin: 707 ng/mL — ABNORMAL HIGH (ref 11–307)

## 2023-07-27 NOTE — Progress Notes (Signed)
[facility] [phone]   [phone]  OFFICE PROGRESS NOTE  Carney Living, MD [address] [place] [address]  DIAGNOSIS: Thalassemia minor  PRIOR THERAPY: None  CURRENT THERAPY: Observation  INTERVAL HISTORY: Hannah Landry 77 y.o. female returns to the clinic today for follow-up visit.  The patient is feeling fine today with no concerning complaints except for increased urine frequency few days ago.  It comes and episodes.  She denied having any dysuria or hematuria.  She denied having any current fatigue or weakness.  She has no nausea, vomiting, diarrhea or constipation.  She has no chest pain, shortness of breath, cough or hemoptysis.  She is here today for evaluation and repeat blood work. MEDICAL HISTORY: Past Medical History:  Diagnosis Date   Anemia    Heart murmur    Hx of adenomatous colonic polyps 09/18/2017   Thalassanemia     ALLERGIES:  has No Known Allergies.  MEDICATIONS:  Current Outpatient Medications  Medication Sig Dispense Refill   acetaminophen (TYLENOL) 325 MG tablet Take 650 mg by mouth every 6 (six) hours as needed.     ALPHAGAN P 0.15 % ophthalmic solution      Calcium Carbonate-Vit D-Min (CALTRATE 600+D PLUS MINERALS) 600-800 MG-UNIT TABS Take by mouth.     Dorzolamide HCl-Timolol Mal PF 2-0.5 % SOLN Apply 1 drop to eye 2 (two) times daily.     fluticasone (CUTIVATE) 0.05 % cream Apply topically 2 (two) times daily. (Patient taking differently: Apply topically as needed.) 30 g 2   folic acid (FOLVITE) 400 MCG tablet Take 400 mcg by mouth daily.       Garlic 1000 MG CAPS Take by mouth.     metoprolol tartrate (LOPRESSOR) 25 MG tablet Take 1 tablet (25 mg total) by mouth 2 (two) times daily. 180 tablet 3   Multiple Vitamins-Minerals (CENTRUM SILVER ADULT 50+) TABS Take 1 tablet by mouth daily.     valACYclovir (VALTREX) 1000 MG tablet Take 2 tabs now and repeat in 12 hours 12 tablet 2   No  current facility-administered medications for this visit.    SURGICAL HISTORY:  Past Surgical History:  Procedure Laterality Date   ABDOMINAL HYSTERECTOMY     CESAREAN SECTION     COLONOSCOPY      REVIEW OF SYSTEMS:  A comprehensive review of systems was negative except for: Constitutional: positive for fatigue Neurological: positive for paresthesia   PHYSICAL EXAMINATION: General appearance: alert, cooperative, fatigued, and no distress Head: Normocephalic, without obvious abnormality, atraumatic Neck: no adenopathy, no JVD, supple, symmetrical, trachea midline, and thyroid not enlarged, symmetric, no tenderness/mass/nodules Lymph nodes: Cervical, supraclavicular, and axillary nodes normal. Resp: clear to auscultation bilaterally Back: symmetric, no curvature. ROM normal. No CVA tenderness. Cardio: regular rate and rhythm, S1, S2 normal, no murmur, click, rub or gallop GI: soft, non-tender; bowel sounds normal; no masses,  no organomegaly Extremities: extremities normal, atraumatic, no cyanosis or edema  ECOG PERFORMANCE STATUS: 1 - Symptomatic but completely ambulatory  Blood pressure 126/78, pulse 60, temperature 98.2 F (36.8 C), temperature source Oral, resp. rate 16, height 5\' 1"  (1.549 m), weight 127 lb 9.6 oz (57.9 kg), SpO2 100%.  LABORATORY DATA: Lab Results  Component Value Date   WBC 8.7 07/27/2023   HGB 10.3 (L) 07/27/2023   HCT 33.1 (L) 07/27/2023   MCV 57.4 (L) 07/27/2023   PLT 254 07/27/2023      Chemistry  Component Value Date/Time   NA 144 06/10/2023 2057   NA 141 10/04/2022 0951   K 3.5 06/10/2023 2057   CL 101 06/10/2023 2057   CO2 28 06/10/2023 2057   BUN 10 06/10/2023 2057   BUN 14 10/04/2022 0951   CREATININE 0.96 06/10/2023 2057   CREATININE 0.85 07/29/2021 0948   CREATININE 0.81 12/02/2013 1034      Component Value Date/Time   CALCIUM 10.0 06/10/2023 2057   ALKPHOS 51 04/08/2023 1005   AST 16 04/08/2023 1005   AST 14 (L) 07/29/2021  0948   ALT 13 04/08/2023 1005   ALT 12 07/29/2021 0948   BILITOT 1.8 (H) 04/08/2023 1005   BILITOT 1.3 (H) 10/04/2022 0951   BILITOT 1.8 (H) 07/29/2021 0948       RADIOGRAPHIC STUDIES: ECHOCARDIOGRAM COMPLETE  Result Date: 07/04/2023    ECHOCARDIOGRAM REPORT   Patient Name:   Hannah Landry  Date of Exam: 07/04/2023 Medical Rec #:  [id number]     Height:       61.0 in Accession #:    [id number]    Weight:       129.0 lb Date of Birth:  1946-04-26     BSA:          1.568 m Patient Age:    76 years      BP:           145/70 mmHg Patient Gender: F             HR:           60 bpm. Exam Location:  [address] Procedure: 2D Echo, 3D Echo, Cardiac Doppler, Color Doppler and Strain Analysis Indications:    R07.9 Chest Pain  History:        Patient has no prior history of Echocardiogram examinations.                 Signs/Symptoms:Edema; Risk Factors:HLD.  Sonographer:    Clearence Ped RCS Referring Phys: [id number] TESSA N CONTE IMPRESSIONS  1. Left ventricular ejection fraction, by estimation, is 55 to 60%. The left ventricle has normal function. The left ventricle has no regional wall motion abnormalities. Left ventricular diastolic parameters are indeterminate. The average left ventricular global longitudinal strain is -23.2 %. The global longitudinal strain is normal.  2. Right ventricular systolic function is normal. The right ventricular size is normal. There is normal pulmonary artery systolic pressure.  3. The mitral valve is normal in structure. Mild mitral valve regurgitation. No evidence of mitral stenosis.  4. The aortic valve is normal in structure. Aortic valve regurgitation is trivial. No aortic stenosis is present.  5. The inferior vena cava is normal in size with greater than 50% respiratory variability, suggesting right atrial pressure of 3 mmHg. FINDINGS  Left Ventricle: Left ventricular ejection fraction, by estimation, is 55 to 60%. The left ventricle has normal function. The left ventricle has no  regional wall motion abnormalities. The average left ventricular global longitudinal strain is -23.2 %. The global longitudinal strain is normal. The left ventricular internal cavity size was normal in size. There is no left ventricular hypertrophy. Left ventricular diastolic parameters are indeterminate. Right Ventricle: The right ventricular size is normal. No increase in right ventricular wall thickness. Right ventricular systolic function is normal. There is normal pulmonary artery systolic pressure. The tricuspid regurgitant velocity is 2.45 m/s, and  with an assumed right atrial pressure of 3 mmHg, the estimated right ventricular systolic pressure is 27.0 mmHg.  Left Atrium: Left atrial size was normal in size. Right Atrium: Right atrial size was normal in size. Pericardium: There is no evidence of pericardial effusion. Mitral Valve: The mitral valve is normal in structure. Mild mitral valve regurgitation. No evidence of mitral valve stenosis. Tricuspid Valve: The tricuspid valve is normal in structure. Tricuspid valve regurgitation is mild . No evidence of tricuspid stenosis. Aortic Valve: The aortic valve is normal in structure. Aortic valve regurgitation is trivial. No aortic stenosis is present. Pulmonic Valve: The pulmonic valve was normal in structure. Pulmonic valve regurgitation is trivial. No evidence of pulmonic stenosis. Aorta: The aortic root is normal in size and structure. Venous: The inferior vena cava is normal in size with greater than 50% respiratory variability, suggesting right atrial pressure of 3 mmHg. IAS/Shunts: No atrial level shunt detected by color flow Doppler.  LEFT VENTRICLE PLAX 2D LVIDd:         4.00 cm   Diastology LVIDs:         2.80 cm   LV e' medial:    12.00 cm/s LV PW:         1.10 cm   LV E/e' medial:  9.6 LV IVS:        0.80 cm   LV e' lateral:   12.70 cm/s LVOT diam:     2.00 cm   LV E/e' lateral: 9.1 LV SV:         67 LV SV Index:   43        2D Longitudinal Strain LVOT  Area:     3.14 cm  2D Strain GLS Avg:     -23.2 %                           3D Volume EF:                          3D EF:        60 %                          LV EDV:       100 ml                          LV ESV:       40 ml                          LV SV:        60 ml RIGHT VENTRICLE RV Basal diam:  3.30 cm RV S prime:     12.80 cm/s TAPSE (M-mode): 2.6 cm RVSP:           27.0 mmHg LEFT ATRIUM             Index        RIGHT ATRIUM           Index LA diam:        2.90 cm 1.85 cm/m   RA Pressure: 3.00 mmHg LA Vol (A2C):   36.7 ml 23.41 ml/m  RA Area:     12.70 cm LA Vol (A4C):   25.4 ml 16.20 ml/m  RA Volume:   29.00 ml  18.50 ml/m LA Biplane Vol: 32.7 ml 20.86 ml/m  AORTIC VALVE LVOT Vmax:   77.40 cm/s LVOT Vmean:  60.600 cm/s LVOT VTI:    0.213 m  AORTA Ao Root diam: 2.40 cm Ao Asc diam:  2.90 cm MITRAL VALVE                TRICUSPID VALVE MV Area (PHT):              TR Peak grad:   24.0 mmHg MV Decel Time:              TR Vmax:        245.00 cm/s MV E velocity: 115.00 cm/s  Estimated RAP:  3.00 mmHg MV A velocity: 110.00 cm/s  RVSP:           27.0 mmHg MV E/A ratio:  1.05                             SHUNTS                             Systemic VTI:  0.21 m                             Systemic Diam: 2.00 cm Kardie Tobb DO Electronically signed by Thomasene Ripple DO Signature Date/Time: 07/04/2023/2:53:13 PM    Final     ASSESSMENT AND PLAN: This is a very pleasant 77 years old African-American female with history of thalassemia minor as well as iron overload.  She has a history of adenomatous polyp in the past  She is currently on observation with vitamin B supplement.  She is feeling much better today with no concerning complaints Repeat CBC showed improvement of her hemoglobin up to 10.3 and hematocrit 33.1 with MCV of 57.4 consistent with the thalassemia minor. I recommended for her to continue on observation with repeat blood work in 6 months. For the peripheral neuropathy she felt much better after  starting metoprolol and she is followed by neurology. For the increased frequency I advised the patient to reach out to her primary care physician for additional evaluation and to rule out urinary tract infection or other etiology of her frequency. The patient was advised to call immediately if she has any other concerning symptoms in the interval. The patient voices understanding of current disease status and treatment options and is in agreement with the current care plan.  All questions were answered. The patient knows to call the clinic with any problems, questions or concerns. We can certainly see the patient much sooner if necessary.   Disclaimer: This note was dictated with voice recognition software. Similar sounding words can inadvertently be transcribed and may not be corrected upon review.

## 2023-07-28 ENCOUNTER — Telehealth: Payer: Self-pay | Admitting: Internal Medicine

## 2023-07-28 NOTE — Telephone Encounter (Signed)
Pt c/o medication issue:  1. Name of Medication:  metoprolol tartrate (LOPRESSOR) 25 MG tablet  2. How are you currently taking this medication (dosage and times per day)?  Twice daily  3. Are you having a reaction (difficulty breathing--STAT)?   4. What is your medication issue?   Patient states Metoprolol is no effective. Patient states for the past week her BP has been elevated in the morning. Systolic has been in the 150-160's and diastolic in the 70-80's range. Patient states whenever her BP is high she notices she gets headaches. Patient states this morning her BP was 135/75.

## 2023-07-28 NOTE — Telephone Encounter (Signed)
Called pt in regards to BP.  Reports started metoprolol on 06/12/23.  BP have been okay until the last week.  Notes wakes up with HA checks BP prior to taking morning med. 9/6- 7 am 155/82-60 9/7- 7 am 148/82-56 9/8- 6 am 155/75-52 4 pm-164/87-66 (popcorn) 9/9- 6 am 163/88-71 3 pm- 135/75-62 9/10-6 am 149/79-63 9/11- 5 am 153/84-66 2:30 pm- 140/71-68 9/12- 5 am 157/80-66 9/13- 5 am 135/75-63  4 pm- 145/77-68  Denies eating processed foods and fried foods.  Has not missed doses of medication.  No new stresses that she can think of.  Rates HA 5:10 does not take Tylenol doesn't want to take too many meds.  As BP goes down HA goes away.  Advised pt provider is not in the office today but will send to be addressed.

## 2023-07-31 NOTE — Telephone Encounter (Signed)
Asa Lente, Tessa N, PA-C  [place], [place], [place] minutes ago (8:00 AM)    I think she needs to come in for a BP check with a nurse and bring her BP cuff. Meds can be addressed at that time.  Thanks! Sharlene Dory, PA-C     Spoke with the pt and endorsed to her recommendations as indicated above by Jari Favre PA-C.  Pt will come into the office for nurse visit/BP check on tomorrow 9/17 at 1100.  She is aware to arrive 10-15 mins prior to this appt.  She is aware that this will be at our [facility] location.  Advised her to bring in her BP cuff to this visit.  Pt verbalized understanding and agrees with this plan.

## 2023-08-01 ENCOUNTER — Telehealth: Payer: Self-pay | Admitting: Internal Medicine

## 2023-08-01 ENCOUNTER — Ambulatory Visit: Payer: Medicare HMO

## 2023-08-01 NOTE — Telephone Encounter (Signed)
Patient is calling because she would like to reschedule her nurse visit appointment for tomorrow morning. Please advise.

## 2023-08-01 NOTE — Telephone Encounter (Addendum)
Spoke to patient she stated she needs to cancel today's nurse visit.She would like to reschedule to 9/18 at [facility].Nurse visit for B/P check scheduled at [facility] office 9/18 at 11:00 am.Patient will bring her blood pressure monitor. Patient also would like to see Dr.Branch sooner.Appointment scheduled with Dr.Branch 10/10 at 10:30 am.

## 2023-08-02 ENCOUNTER — Ambulatory Visit: Payer: Medicare HMO | Attending: Cardiovascular Disease | Admitting: *Deleted

## 2023-08-02 VITALS — BP 130/60 | HR 70 | Wt 128.0 lb

## 2023-08-02 DIAGNOSIS — I10 Essential (primary) hypertension: Secondary | ICD-10-CM | POA: Diagnosis not present

## 2023-08-02 NOTE — Progress Notes (Signed)
   Nurse Visit   Date of Encounter: 08/02/2023 ID: Hannah Landry, DOB 05/10/1946, MRN [id number]  PCP:  Carney Living, MD   [facility] Providers Cardiologist:  Maisie Fus, MD      Visit Details   VS:  BP 130/60 (BP Location: Right Arm, Patient Position: Sitting, Cuff Size: Normal)   Pulse 70   Wt 128 lb (58.1 kg)   BMI 24.19 kg/m  , BMI Body mass index is 24.19 kg/m.  Wt Readings from Last 3 Encounters:  08/02/23 128 lb (58.1 kg)  07/27/23 127 lb 9.6 oz (57.9 kg)  07/20/23 131 lb 12.8 oz (59.8 kg)     Reason for visit: BP check Performed today: Vitals, Dr. Allyson Sabal consulted and education Changes (medications, testing, etc.) : None at this time Length of Visit:  5 minutes    Medications Adjustments/Labs and Tests Ordered: No orders of the defined types were placed in this encounter.  No orders of the defined types were placed in this encounter.    Signed, Shakeitha Umbaugh Merilynn Finland, LPN  1/91/4782 95:62 AM

## 2023-08-03 ENCOUNTER — Telehealth: Payer: Self-pay

## 2023-08-03 NOTE — Telephone Encounter (Signed)
Patient was seen yesterday for nurse visit for BP check.   Manually her BP was 130/60 HR 70. Right arm  With her wrist monitor BP read 154/72 hr 74  With arm cuff BP was 140/58 hr 66  Waited 10 minutes and took manual BP in left arm and her BP was 126/64 hr 68  She stated he BP is only elevated in the morning before her medication. In the afternoon its fine.   Will forward to MD

## 2023-08-04 NOTE — Telephone Encounter (Signed)
Spoke with patient and she is aware provider is not making any medication changes at this time. She will continue to monitor BP.

## 2023-08-24 ENCOUNTER — Ambulatory Visit: Payer: Medicare HMO | Attending: Internal Medicine | Admitting: Internal Medicine

## 2023-08-24 VITALS — BP 122/68 | HR 62 | Ht 61.0 in | Wt 131.8 lb

## 2023-08-24 DIAGNOSIS — R079 Chest pain, unspecified: Secondary | ICD-10-CM

## 2023-08-24 DIAGNOSIS — R030 Elevated blood-pressure reading, without diagnosis of hypertension: Secondary | ICD-10-CM | POA: Diagnosis not present

## 2023-08-24 DIAGNOSIS — R600 Localized edema: Secondary | ICD-10-CM | POA: Diagnosis not present

## 2023-08-24 NOTE — Progress Notes (Signed)
Cardiology Office Note:    Date:  08/24/2023   ID:  Hannah Landry, DOB [birth year], MRN [id number]  PCP:  Carney Living, MD   [facility] Providers Cardiologist:  Maisie Fus, MD     Referring MD: Carney Living, *   No chief complaint on file. CP  History of Present Illness:    Hannah Landry is a 77 y.o. female with a hx of HLD, referral for CP. Her husband recently passed of CA in April 2024. She has had a constellation of symptoms. SThere is no note of CP prior. A referral was sent from the ED when she came in for HA and also described chest heaviness. She also had back pain. Her troponin was negative. EKG was unremarkable. Today, she describes her headaches as the most bothersome symptom.  Otherwise she denies CP with activity. She walks.. She denies DOE. She has no hx of cardiac dx.   Interval 08/24/2023 Hannah Landry is here for follow-up appointment. She was seen by Jari Favre in September who noted some inappropriate sinus tachycardia , she notes with when BP increases. She is continued on metoprolol which she started in July.  EKG at that time was normal. Blood pressure check in September and the manual blood pressure was normal 130/60 mmHg. Her blood pressure is well-controlled today.  She has a BP log with her today. Notes fluid around her ankles, it will resolve with elevating her legs.   Past Medical History:  Diagnosis Date   Anemia    Heart murmur    Hx of adenomatous colonic polyps 09/18/2017   Thalassanemia     Past Surgical History:  Procedure Laterality Date   ABDOMINAL HYSTERECTOMY     CESAREAN SECTION     COLONOSCOPY      Current Medications: Current Outpatient Medications on File Prior to Visit  Medication Sig Dispense Refill   acetaminophen (TYLENOL) 325 MG tablet Take 650 mg by mouth every 6 (six) hours as needed.     ALPHAGAN P 0.15 % ophthalmic solution      Calcium Carbonate-Vit D-Min (CALTRATE 600+D PLUS MINERALS) 600-800  MG-UNIT TABS Take by mouth.     Dorzolamide HCl-Timolol Mal PF 2-0.5 % SOLN Apply 1 drop to eye 2 (two) times daily.     fluticasone (CUTIVATE) 0.05 % cream Apply topically 2 (two) times daily. (Patient taking differently: Apply topically as needed.) 30 g 2   folic acid (FOLVITE) 400 MCG tablet Take 400 mcg by mouth daily.       Garlic 1000 MG CAPS Take by mouth.     metoprolol tartrate (LOPRESSOR) 25 MG tablet Take 1 tablet (25 mg total) by mouth 2 (two) times daily. 180 tablet 3   Multiple Vitamins-Minerals (CENTRUM SILVER ADULT 50+) TABS Take 1 tablet by mouth daily.     valACYclovir (VALTREX) 1000 MG tablet Take 2 tabs now and repeat in 12 hours 12 tablet 2   No current facility-administered medications on file prior to visit.    Allergies:   Patient has no known allergies.   Social History   Socioeconomic History   Marital status: Widowed    Spouse name: Cydney Alvarenga   Number of children: Not on file   Years of education: Not on file   Highest education level: Bachelor's degree (e.g., BA, AB, BS)  Occupational History   Not on file  Tobacco Use   Smoking status: Never   Smokeless tobacco: Never  Substance and Sexual Activity  Alcohol use: No   Drug use: No   Sexual activity: Not Currently  Other Topics Concern   Not on file  Social History Narrative   Widowed    Retired    Family in [place]    Social Determinants of Health   Financial Resource Strain: Low Risk  (06/27/2023)   Overall Financial Resource Strain (CARDIA)    Difficulty of Paying Living Expenses: Not very hard  Food Insecurity: No Food Insecurity (06/27/2023)   Hunger Vital Sign    Worried About Running Out of Food in the Last Year: Never true    Ran Out of Food in the Last Year: Never true  Transportation Needs: No Transportation Needs (06/27/2023)   PRAPARE - Administrator, Civil Service (Medical): No    Lack of Transportation (Non-Medical): No  Physical Activity: Inactive (06/27/2023)    Exercise Vital Sign    Days of Exercise per Week: 0 days    Minutes of Exercise per Session: 0 min  Stress: No Stress Concern Present (06/27/2023)   [employer] of Occupational Health - Occupational Stress Questionnaire    Feeling of Stress : Only a little  Social Connections: Unknown (06/27/2023)   Social Connection and Isolation Panel [NHANES]    Frequency of Communication with Friends and Family: More than three times a week    Frequency of Social Gatherings with Friends and Family: Once a week    Attends Religious Services: Patient declined    Database administrator or Organizations: No    Attends Banker Meetings: Never    Marital Status: Widowed     Family History: The patient's family history includes Colon polyps in her mother; Hypertension in her father and mother. There is no history of Colon cancer, Esophageal cancer, Rectal cancer, or Stomach cancer. No family hx of CAD  ROS:   Please see the history of present illness.     All other systems reviewed and are negative.  EKGs/Labs/Other Studies Reviewed:    The following studies were reviewed today:  TTE 07/04/2023 EF 55 to 60% RV function is normal Mild mitral valve regurgitation  Recent Labs: 04/08/2023: ALT 13; B Natriuretic Peptide 82.5; Magnesium 2.0 04/21/2023: TSH 0.69 06/10/2023: BUN 10; Creatinine, Ser 0.96; Potassium 3.5; Sodium 144 07/27/2023: Hemoglobin 10.3; Platelet Count 254   Recent Lipid Panel    Component Value Date/Time   CHOL 201 (H) 10/04/2022 0951   TRIG 94 10/04/2022 0951   HDL 81 10/04/2022 0951   CHOLHDL 2.5 10/04/2022 0951   CHOLHDL 2.4 09/14/2016 0918   VLDL 21 09/14/2016 0918   LDLCALC 103 (H) 10/04/2022 0951     Risk Assessment/Calculations:     Physical Exam:    VS:   Vitals:   08/24/23 1042  BP: 122/68  Pulse: 62  SpO2: 100%    Wt Readings from Last 3 Encounters:  08/24/23 131 lb 12.8 oz (59.8 kg)  08/02/23 128 lb (58.1 kg)  07/27/23 127 lb 9.6 oz  (57.9 kg)     GEN:  Well nourished, well developed in no acute distress HEENT: Normal LYMPHATICS: No lymphadenopathy CARDIAC: RRR, no murmurs, rubs, gallops RESPIRATORY:  Clear to auscultation without rales, wheezing or rhonchi  ABDOMEN: Soft, non-tender, non-distended MUSCULOSKELETAL:  No edema; No deformity  SKIN: Warm and dry NEUROLOGIC:  Alert and oriented x 3 PSYCHIATRIC:  Normal affect   ASSESSMENT:    Chest pain; non cardiac  - had negative ACS in the ED, EKG did  not show  ischemia  - risk only includes age. She has no symptoms with activity. Symptoms have resolved. Stress likely contributing.  No signs of Takotsubo, I have low suspicion. - if her CP were to be related to activity or she has DOE, happy to see her back  HTN - well controlled , continue current medication  LE dependent edema  -Her echo is normal and she has no signs of heart failure - continue compression stockings PLAN:    In order of problems listed above:  Follow up 6 months     Medication Adjustments/Labs and Tests Ordered: Current medicines are reviewed at length with the patient today.  Concerns regarding medicines are outlined above.  No orders of the defined types were placed in this encounter.  No orders of the defined types were placed in this encounter.   There are no Patient Instructions on file for this visit.   Signed, Maisie Fus, MD  08/24/2023 10:55 AM    [facility]

## 2023-08-24 NOTE — Patient Instructions (Signed)
Medication Instructions:  NO CHANGES   *If you need a refill on your cardiac medications before your next appointment, please call your pharmacy*   Lab Work: NONE    If you have labs (blood work) drawn today and your tests are completely normal, you will receive your results only by: MyChart Message (if you have MyChart) OR A paper copy in the mail If you have any lab test that is abnormal or we need to change your treatment, we will call you to review the results.   Testing/Procedures: NONE   Follow-Up: At [facility], you and your health needs are our priority.  As part of our continuing mission to provide you with exceptional heart care, we have created designated Provider Care Teams.  These Care Teams include your primary Cardiologist (physician) and Advanced Practice Providers (APPs -  Physician Assistants and Nurse Practitioners) who all work together to provide you with the care you need, when you need it.  We recommend signing up for the patient portal called "MyChart".  Sign up information is provided on this After Visit Summary.  MyChart is used to connect with patients for Virtual Visits (Telemedicine).  Patients are able to view lab/test results, encounter notes, upcoming appointments, etc.  Non-urgent messages can be sent to your provider as well.   To learn more about what you can do with MyChart, go to [email or web address].    Your next appointment:   6 month(s)  The format for your next appointment:   In Person  Provider:   Maisie Fus, MD

## 2023-09-05 ENCOUNTER — Ambulatory Visit: Payer: Medicare HMO

## 2023-09-05 DIAGNOSIS — Z23 Encounter for immunization: Secondary | ICD-10-CM | POA: Diagnosis not present

## 2023-09-05 NOTE — Progress Notes (Addendum)
Patient presents to nurse clinic for Flu vaccine.  Vaccine administered without complication.  Patient scheduled for future labs and Covid vaccine for 11/1.

## 2023-09-06 NOTE — Progress Notes (Unsigned)
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  Increase dose in 4 weeks if needed Limit use of pain relievers (Tylenol) to no more than 2 days out of week to prevent risk of rebound or medication-overuse headache. To evaluate for causes of neuropathy, check B12, TSH, ANA with ENA panel, IFE, ACE Encouraged routine walking/activity Follow up 6 months.   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  UPDATE: Started gabapentin to treat headache.  Endorsed increased dizziness.  She was switched to duloxetine but reported that she felt bad after one dose and wouldn't take it again.  Advised to contact us when symptoms improved and we can then start nortriptyline.  ***  Checked labs in June to evaluate for causes of neuropathy were unremarkable:  negative ANA, negative ENA panel (with SSA/SSB antibodies), negative IFE, ACE 29, B12 1156, TSH 0.69  HISTORY: Patient has had headaches off and since Oct 09, 2022.  Her husband passed away on 04-08-23.  She has been experiencing grief and poor sleep.  She started having worsening headaches,She also has chronic weakness attributed to her anemia but felt worsening weakness in the legs.  She has had tingling in her fingers and toes for about a year but started noticing involvement of her legs as well.  No radicular pain in the legs.  She feels off balance on her feet.  She takes oral B12 supplement.  Copper, heavy metal screen and CK have been unremarkable.  She presented to the ED on 5/25.for a particularly severe headache but also for new left arm heaviness and chest pressure and back pain.  EKG and labs were negative for acute coronary event.  CTA chest negative for PE.  CT head negative for acute  intracranial abnormalities.  MRI of brain without contrast was also unremarkable, revealing age-related mild generalized cerebral atrophy and few punctate T2/FLAIR hyperintense foci in the cerebral white matter.  She was given ASA for chest pressure and Reglan and Benadryl for headache.  She continues to have daily headaches but no longer severe.  They occur after getting up in the morning. She takes Tylenol daily, which helps.  It is a throbbing pain at her crown or top of the head.  No associated nausea, vomiting, photophobia, phonophobia, visual disturbance.    PAST MEDICAL HISTORY: Past Medical History:  Diagnosis Date   Anemia    Heart murmur    Hx of adenomatous colonic polyps 09/18/2017   Thalassanemia     MEDICATIONS: Current Outpatient Medications on File Prior to Visit  Medication Sig Dispense Refill   acetaminophen (TYLENOL) 325 MG tablet Take 650 mg by mouth every 6 (six) hours as needed.     ALPHAGAN P 0.15 % ophthalmic solution      Calcium Carbonate-Vit D-Min (CALTRATE 600+D PLUS MINERALS) 600-800 MG-UNIT TABS Take by mouth.     Dorzolamide HCl-Timolol Mal PF 2-0.5 % SOLN Apply 1 drop to eye 2 (two) times daily.     fluticasone (CUTIVATE) 0.05 % cream Apply topically 2 (two) times daily. (Patient taking differently: Apply topically as needed.) 30 g 2   folic acid (FOLVITE) 400 MCG tablet Take 400 mcg by mouth daily.       Garlic 1000 MG CAPS Take by mouth.     metoprolol tartrate (  LOPRESSOR) 25 MG tablet Take 1 tablet (25 mg total) by mouth 2 (two) times daily. 180 tablet 3   Multiple Vitamins-Minerals (CENTRUM SILVER ADULT 50+) TABS Take 1 tablet by mouth daily.     valACYclovir (VALTREX) 1000 MG tablet Take 2 tabs now and repeat in 12 hours 12 tablet 2   No current facility-administered medications on file prior to visit.    ALLERGIES: No Known Allergies  FAMILY HISTORY: Family History  Problem Relation Age of Onset   Colon polyps Mother    Hypertension Mother     Hypertension Father    Colon cancer Neg Hx    Esophageal cancer Neg Hx    Rectal cancer Neg Hx    Stomach cancer Neg Hx       Objective:  *** General: No acute distress.  Patient appears ***-groomed.   Head:  Normocephalic/atraumatic Eyes:  Fundi examined but not visualized Neck: supple, no paraspinal tenderness, full range of motion Heart:  Regular rate and rhythm Lungs:  Clear to auscultation bilaterally Back: No paraspinal tenderness Neurological Exam: alert and oriented.  Speech fluent and not dysarthric, language intact.  CN II-XII intact. Bulk and tone normal, muscle strength 5/5 throughout.  Sensation to light touch intact.  Deep tendon reflexes 2+ throughout, toes downgoing.  Finger to nose testing intact.  Gait normal, Romberg negative.   Shon Millet, DO  CC: ***

## 2023-09-07 ENCOUNTER — Ambulatory Visit: Payer: Medicare HMO | Admitting: Neurology

## 2023-09-07 ENCOUNTER — Encounter: Payer: Self-pay | Admitting: Neurology

## 2023-09-07 VITALS — BP 139/66 | HR 64 | Ht 61.0 in | Wt 138.0 lb

## 2023-09-07 DIAGNOSIS — R29898 Other symptoms and signs involving the musculoskeletal system: Secondary | ICD-10-CM | POA: Diagnosis not present

## 2023-09-07 DIAGNOSIS — R519 Headache, unspecified: Secondary | ICD-10-CM

## 2023-09-07 NOTE — Patient Instructions (Addendum)
Nerve conduction study of both legs Further recommendations pending results. Follow up

## 2023-09-15 ENCOUNTER — Ambulatory Visit (INDEPENDENT_AMBULATORY_CARE_PROVIDER_SITE_OTHER): Payer: Medicare HMO

## 2023-09-15 ENCOUNTER — Other Ambulatory Visit: Payer: Medicare HMO

## 2023-09-15 DIAGNOSIS — Z23 Encounter for immunization: Secondary | ICD-10-CM | POA: Diagnosis not present

## 2023-09-15 DIAGNOSIS — E785 Hyperlipidemia, unspecified: Secondary | ICD-10-CM

## 2023-09-15 NOTE — Progress Notes (Signed)
Patient presents to nurse clinic for [place] vaccination. Administered in RD, site unremarkable, tolerated injection well.   Talbot Grumbling, RN

## 2023-09-16 LAB — LIPID PANEL
Chol/HDL Ratio: 2.8 ratio (ref 0.0–4.4)
Cholesterol, Total: 178 mg/dL (ref 100–199)
HDL: 64 mg/dL (ref >39)
LDL Chol Calc (NIH): 96 mg/dL (ref 0–99)
Triglycerides: 102 mg/dL (ref 0–149)
VLDL Cholesterol Cal: 18 mg/dL (ref 5–40)

## 2023-09-25 ENCOUNTER — Telehealth: Payer: Self-pay | Admitting: Internal Medicine

## 2023-09-25 ENCOUNTER — Encounter: Payer: Self-pay | Admitting: Internal Medicine

## 2023-09-25 NOTE — Telephone Encounter (Signed)
Called and spoke to patient who reports shr started to have a headache around 3:00 pm and took her blood pressure. Her BP at that time was 186/97 HR 97, 189/93. HR 65. She deny any other symptoms at this time. She is compliant with her medication and no excessive salt intake. She does report her BP at this time is 138/78 HR 54.

## 2023-09-25 NOTE — Telephone Encounter (Signed)
Pt c/o BP issue: STAT if pt c/o blurred vision, one-sided weakness or slurred speech  1. What are your last 5 BP readings? 186/97; 64; 189/93; 65  2. Are you having any other symptoms (ex. Dizziness, headache, blurred vision, passed out)? Headache,   3. What is your BP issue? Experiencing spike in BP.

## 2023-09-26 NOTE — Telephone Encounter (Signed)
Patient identification verified by 2 forms. Marilynn Rail, RN   Called and spoke to patient  Patient states:   -BP today is 143/71  -when she woke up this morning BP was 154/82 before medication  -two hours after taking medicine decreased to 132/74  -Yesterday BP increased to 189/93 and had a headache, but decreased after taking metoprolol  -headache resolved after BP decreased  -in the past 6 months BP has increased that high a few times and has called office  -feels like BP medication does not decrease her BP as much anymore  -checks BP weekly unless has symptoms  Patient denies:   -headache   -chest pressure/pain  Advised patient to continue checking BP and taking medications as directed, outreach if has another episode of high BP  Reviewed ED warning signs/precautions  Patient verbalized understanding, no questions at this time

## 2023-09-28 ENCOUNTER — Ambulatory Visit: Payer: Medicare HMO | Admitting: Neurology

## 2023-09-28 DIAGNOSIS — R29898 Other symptoms and signs involving the musculoskeletal system: Secondary | ICD-10-CM | POA: Diagnosis not present

## 2023-09-28 NOTE — Procedures (Signed)
[facility]  [address] [place], [address]  [place], [place] [address] Tel: [phone] Fax: [phone] Test Date:  09/28/2023  Patient: Hannah Landry DOB: Dec 13, 1945 Physician: Nita Sickle, DO  Sex: Female Height: 5\' 1"  Ref Phys: Shon Millet, DO  ID#: [id number]   Technician:    History: This is a 77 year old female referred for evaluation of bilateral leg weakness.  NCV & EMG Findings: Electrodiagnostic testing of the right lower extremity and additional studies of the left shows: Bilateral sural and superficial peroneal sensory responses are within normal limits. Bilateral peroneal and tibial motor responses are within normal limits. Bilateral tibial H reflex studies are within normal limits. There is no evidence of active or chronic motor axonal changes affecting any of the tested muscles.  Motor unit configuration and recruitment pattern is within normal limits.   Impression: This is a normal study of the lower extremities.  In particular, there is no evidence of a large fiber sensorimotor polyneuropathy or lumbosacral radiculopathy.   ___________________________ Nita Sickle, DO    Nerve Conduction Studies   Stim Site NR Peak (ms) Norm Peak (ms) O-P Amp (V) Norm O-P Amp  Left Sup Peroneal Anti Sensory (Ant Lat Mall)  32 C  12 cm    2.4 <4.6 16.4 >3  Right Sup Peroneal Anti Sensory (Ant Lat Mall)  32 C  12 cm    2.2 <4.6 17.1 >3  Left Sural Anti Sensory (Lat Mall)  32 C  Calf    2.8 <4.6 34.2 >3  Right Sural Anti Sensory (Lat Mall)  32 C  Calf    3.0 <4.6 34.5 >3     Stim Site NR Onset (ms) Norm Onset (ms) O-P Amp (mV) Norm O-P Amp Site1 Site2 Delta-0 (ms) Dist (cm) Vel (m/s) Norm Vel (m/s)  Left Peroneal Motor (Ext Dig Brev)  32 C  Ankle    3.7 <6.0 3.1 >2.5 B Fib Ankle 6.8 34.0 50 >40  B Fib    10.5  2.9  Poplt B Fib 1.4 8.0 57 >40  Poplt    11.9  2.8         Right Peroneal Motor (Ext Dig Brev)  32 C  Ankle    2.3 <6.0 4.2 >2.5 B Fib Ankle  7.2 34.0 47 >40  B Fib    9.5  4.1  Poplt B Fib 1.4 8.0 57 >40  Poplt    10.9  4.0         Left Tibial Motor (Abd Hall Brev)  32 C  Ankle    3.8 <6.0 13.5 >4 Knee Ankle 8.2 38.0 46 >40  Knee    12.0  9.1         Right Tibial Motor (Abd Hall Brev)  32 C  Ankle    3.7 <6.0 10.3 >4 Knee Ankle 7.2 38.0 53 >40  Knee    10.9  7.6          Electromyography   Side Muscle Ins.Act Fibs Fasc Recrt Amp Dur Poly Activation Comment  Right AntTibialis Nml Nml Nml Nml Nml Nml Nml Nml N/A  Right Gastroc Nml Nml Nml Nml Nml Nml Nml Nml N/A  Right Flex Dig Long Nml Nml Nml Nml Nml Nml Nml Nml N/A  Right RectFemoris Nml Nml Nml Nml Nml Nml Nml Nml N/A  Right GluteusMed Nml Nml Nml Nml Nml Nml Nml Nml N/A  Left AntTibialis Nml Nml Nml Nml Nml Nml Nml Nml N/A  Left Gastroc  Nml Nml Nml Nml Nml Nml Nml Nml N/A  Left Flex Dig Long Nml Nml Nml Nml Nml Nml Nml Nml N/A  Left RectFemoris Nml Nml Nml Nml Nml Nml Nml Nml N/A  Left GluteusMed Nml Nml Nml Nml Nml Nml Nml Nml N/A      Waveforms:

## 2023-09-29 ENCOUNTER — Other Ambulatory Visit: Payer: Self-pay

## 2023-09-29 DIAGNOSIS — R29898 Other symptoms and signs involving the musculoskeletal system: Secondary | ICD-10-CM

## 2023-10-17 ENCOUNTER — Encounter: Payer: Self-pay | Admitting: Neurology

## 2023-10-23 ENCOUNTER — Ambulatory Visit
Admission: RE | Admit: 2023-10-23 | Discharge: 2023-10-23 | Disposition: A | Discharge status: Home / Self Care | Payer: Medicare HMO | Source: Ambulatory Visit | Attending: Neurology | Admitting: Neurology

## 2023-10-23 DIAGNOSIS — R29898 Other symptoms and signs involving the musculoskeletal system: Secondary | ICD-10-CM

## 2023-10-24 ENCOUNTER — Ambulatory Visit: Payer: Medicare Other | Admitting: Neurology

## 2023-11-06 ENCOUNTER — Other Ambulatory Visit: Payer: Self-pay

## 2023-11-06 DIAGNOSIS — R29898 Other symptoms and signs involving the musculoskeletal system: Secondary | ICD-10-CM

## 2023-11-25 ENCOUNTER — Telehealth: Payer: Self-pay | Admitting: Internal Medicine

## 2023-11-25 NOTE — Telephone Encounter (Signed)
 Pt calls reporting elevated BP in afternoon 202/90s with symptoms of mild headache and chest pain. No clear triggers. She measured BP again in the evening prior to call and was in the 160s/80s with symptoms resolved. She is about to take her second metoprolol  tartrate. No further recommendations at this time, will message primary cardiologist. Patient counseled on symptoms to watch for in setting of recurrent BP elevation and when to seek additional care.

## 2023-11-28 ENCOUNTER — Encounter: Payer: Self-pay | Admitting: Internal Medicine

## 2023-11-30 ENCOUNTER — Ambulatory Visit: Payer: Medicare HMO

## 2023-11-30 ENCOUNTER — Other Ambulatory Visit: Payer: Self-pay

## 2023-11-30 ENCOUNTER — Ambulatory Visit: Payer: Medicare HMO | Attending: Neurology

## 2023-11-30 DIAGNOSIS — R29898 Other symptoms and signs involving the musculoskeletal system: Secondary | ICD-10-CM | POA: Insufficient documentation

## 2023-11-30 DIAGNOSIS — R262 Difficulty in walking, not elsewhere classified: Secondary | ICD-10-CM | POA: Diagnosis present

## 2023-11-30 DIAGNOSIS — M6281 Muscle weakness (generalized): Secondary | ICD-10-CM | POA: Insufficient documentation

## 2023-11-30 MED ORDER — AMLODIPINE BESYLATE 5 MG PO TABS: 5.0000 mg | ORAL_TABLET | Freq: Every day | ORAL | 3 refills | Status: DC

## 2023-11-30 NOTE — Therapy (Signed)
OUTPATIENT PHYSICAL THERAPY EVALUATION   Patient Name: Hannah Landry MRN: [id number] DOB:11/01/1946, 78 y.o., female Today's Date: 11/30/2023  END OF SESSION: Visit Number 1 Number of Visits 12 Date for PT Re-evaluation 01/11/2024   PT start time 1045 PT stop time 1125 PT time calculation (min) 40 min   Activity Tolerance: Patient tolerated treatment well Behavior During Therapy: [facility] for tasks assessed/performed    Past Medical History:  Diagnosis Date   Anemia    Heart murmur    Hx of adenomatous colonic polyps 09/18/2017   Thalassanemia    Past Surgical History:  Procedure Laterality Date   ABDOMINAL HYSTERECTOMY     CESAREAN SECTION     COLONOSCOPY     Patient Active Problem List   Diagnosis Date Noted   Paresthesias 04/12/2023   Insomnia 04/12/2023   Nail discoloration 03/02/2023   Elevated blood pressure reading 09/22/2021   Iron overload syndrome 01/27/2021   Hx of adenomatous colonic polyps 09/18/2017   Hyperlipidemia 05/09/2012   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    PCP: Hannah Bender, MD  REFERRING PROVIDER: Drema Dallas, DO  REFERRING DIAG: Bilateral leg weakness [R29.898]   THERAPY DIAG:  Muscle weakness (generalized)  Difficulty in walking, not elsewhere classified  Rationale for Evaluation and Treatment: Rehabilitation  ONSET DATE: 3 months  SUBJECTIVE:  Vitals: 60 bpm, 97% o2 sat, 124/55   SUBJECTIVE STATEMENT:    Her lower extremity challenges began in July 2025. She states that the tingling in her legs has improved. However, she is feeling weakness, fatigue and a trembling/vibrating feeling in both of her legs. She states that trembling gets better when she sits or lays down. She is wearing compression socks according to physician's recommendation.    PERTINENT HISTORY: Relevant PMHx includes anemia, Thalassanemia minor, Heart murmur  Per referring provider "Her husband passed away on 03-27-23.  She has been experiencing grief and poor  sleep. She also has chronic weakness attributed to her anemia but felt worsening weakness in the legs. She has had tingling in her fingers and toes for about a year but started noticing involvement of her legs as well. No radicular pain in the legs. She feels off balance on her feet."   She recently starting taking metaprolol  PAIN:  Are you having pain? No   PRECAUTIONS: None  RED FLAGS: None   WEIGHT BEARING RESTRICTIONS: No  FALLS:  Has patient fallen in last 6 months? No  LIVING ENVIRONMENT: Lives with: lives alone Lives in: House/apartment Stairs: Yes, 1 flight with a railing  Has following equipment at home: None  OCCUPATION: retired; regular volunteering in the community   PLOF: Independent with household mobility without device and Independent with community mobility without device  PATIENT GOALS: Get back to more exercising and more walking   NEXT MD VISIT: 03/11/24 with referring provider   OBJECTIVE:  Note: Objective measures were completed at Evaluation unless otherwise noted.  DIAGNOSTIC FINDINGS:   10/23/23 MRI Lumbar Spine w/o contrast   IMPRESSION: 1. At L3-4 there is a moderate disc bulge. Moderate bilateral facet arthropathy. Moderate central canal stenosis. 2. At L4-5 there is a mild disc bulge. Moderate bilateral facet arthropathy with ligamentum flavum thickening. Moderate central canal stenosis and bilateral lateral recess stenosis. 3. At L2-3 there is a mild disc bulge. Moderate bilateral facet arthropathy. Mild bilateral foraminal narrowing. 4. No acute osseous injury of the lumbar spine.  PATIENT SURVEYS:  FOTO 63 current, 72 predicted  COGNITION: Overall cognitive status: Within functional  limits for tasks assessed     POSTURE: flexed trunk  with prolonged sitting and with ambulation    LOWER EXTREMITY MMT:  MMT Right eval Left eval  Hip flexion 4 4+  Hip extension    Hip abduction 4+ 4+  Hip adduction 4+ 4+  Hip internal  rotation 4- 3+  Hip external rotation 4 4-  Knee flexion 5 5  Knee extension 5 5  Ankle dorsiflexion    Ankle plantarflexion    Ankle inversion    Ankle eversion     (Blank rows = not tested)   FUNCTIONAL TESTS:  5 times sit to stand: 17 sec  2 minute walk test: next visit  MCTSIB: Condition 1: Avg of 3 trials: 30 sec, Condition 2: Avg of 3 trials: 30 sec, Condition 3: Avg of 3 trials: 24 sec, Condition 4: Avg of 3 trials: 30 sec, and Total Score: 114/120   GAIT: Distance walked: 50 ft Assistive device utilized: None Comments: slow gait speed; to be further assessed at f/u visit                                                                                                                                 TREATMENT DATE:   [facility] Adult PT Treatment:                                                DATE: 11/30/2023   Initial evaluation: see patient education and home exercise program as noted below      PATIENT EDUCATION:  Education details: reviewed initial home exercise program; discussion of POC, prognosis and goals for skilled PT   Person educated: Patient Education method: Explanation, Demonstration, and Handouts Education comprehension: verbalized understanding, returned demonstration, and needs further education  HOME EXERCISE PROGRAM: Verbal instruction - begin to increased frequency of short (< 5 min) bouts of walking/activity  ASSESSMENT:  CLINICAL IMPRESSION: Hannah Landry is a 78 y.o. female who was seen today for physical therapy evaluation and treatment for Bilateral LE weakness and difficulty walking. She denies presence of pain, but states that she has BIL LE paresthesias, with absence of LBP or paresthesias of the feet. Her current presentation is likely affected by presence of lumbar stenosis as noted in recent MRI results. She is demonstrating decreased LS AROM, decreased BIL LE MMT scores, and increased sit-to-stand time. She has related pain and difficulty with  prolonged walking/standing, stair climbing, and lifting in order to perform household duties and participate in community activities. She requires skilled PT services at this time to address relevant deficits and improve overall function.     OBJECTIVE IMPAIRMENTS: cardiopulmonary status limiting activity, decreased activity tolerance, difficulty walking, decreased ROM, decreased strength, and postural dysfunction.   ACTIVITY LIMITATIONS: carrying, lifting, bending, standing, squatting, stairs, transfers, and locomotion  level  PARTICIPATION LIMITATIONS: meal prep, cleaning, laundry, shopping, and community activity  PERSONAL FACTORS: Age, Past/current experiences, Time since onset of injury/illness/exacerbation, and 1-2 comorbidities: Anemia, Thalassanemia minor  are also affecting patient's functional outcome.   REHAB POTENTIAL: Fair    CLINICAL DECISION MAKING: Stable/uncomplicated  EVALUATION COMPLEXITY: Low   GOALS: Goals reviewed with patient? Yes  SHORT TERM GOALS: Target date: 12/21/2023  Patient will be independent with initial home program for LE strengthening and standing tolerance.  Baseline: written HEP to be provided at f/u visit  Goal status: INITIAL   LONG TERM GOALS: Target date: 01/11/2024   Patient will report improved overall functional ability with FOTO score of 70 or greater    Baseline: 63 Goal status: INITIAL  2.  Patient will demonstrate at least 4+/5 MMT throughout BIL LE strength testing  Baseline: see objective measures Goal status: INITIAL  3.  Patient will decrease 5x STS test to 15 seconds or less without using arm rests.   Baseline: 17 seconds using arm rest  Goal status: INITIAL  4. Patient will demonstrate ability to tolerate at least 20 minutes of standing and walking tasks prior to requiring seated rest break.   Baseline: unable to tolerate >5 minutes   Goal status: INITIAL     PLAN:  PT FREQUENCY: 1-2x/week  PT DURATION: 8  weeks  PLANNED INTERVENTIONS: 97164- PT Re-evaluation, 97110-Therapeutic exercises, 97530- Therapeutic activity, 97112- Neuromuscular re-education, 97535- Self Care, 16109- Manual therapy, 97014- Electrical stimulation (unattended), [id number]- Electrical stimulation (manual), [id number]- Traction (mechanical), Patient/Family education, Taping, Dry Needling, Spinal mobilization, Cryotherapy, and Moist heat  PLAN FOR NEXT SESSION: monitor BP and HR before/after activity; 2 MWT, LE strengthening, core strengthening in neutral spine; progress towards established functional goals    Mauri Reading, PT, DPT  12/02/2023 1:04 PM

## 2023-11-30 NOTE — Addendum Note (Signed)
Addended by: Myriam Jacobson on: 11/30/2023 10:15 AM   Modules accepted: Orders

## 2023-12-05 ENCOUNTER — Ambulatory Visit: Payer: Medicare HMO | Admitting: Dermatology

## 2023-12-06 ENCOUNTER — Other Ambulatory Visit: Payer: Self-pay

## 2023-12-06 ENCOUNTER — Encounter: Payer: Self-pay | Admitting: Neurology

## 2023-12-06 MED ORDER — AMLODIPINE BESYLATE 5 MG PO TABS: 5.0000 mg | ORAL_TABLET | Freq: Every day | ORAL | 3 refills | Status: DC

## 2023-12-07 ENCOUNTER — Ambulatory Visit: Payer: Medicare HMO

## 2023-12-07 ENCOUNTER — Ambulatory Visit: Payer: Medicare HMO | Admitting: Physical Therapy

## 2023-12-13 ENCOUNTER — Ambulatory Visit: Payer: Medicare HMO

## 2023-12-13 DIAGNOSIS — M6281 Muscle weakness (generalized): Secondary | ICD-10-CM | POA: Diagnosis not present

## 2023-12-13 DIAGNOSIS — R262 Difficulty in walking, not elsewhere classified: Secondary | ICD-10-CM

## 2023-12-13 NOTE — Therapy (Signed)
OUTPATIENT PHYSICAL THERAPY NOTE   Patient Name: Hannah Landry MRN: [id number] DOB:11/27/1945, 78 y.o., female Today's Date: 12/13/2023  END OF SESSION:  PT End of Session - 12/13/23 1904     Visit Number 2    Number of Visits 12    Date for PT Re-Evaluation 01/11/24    Authorization Type [employer] MCR    PT Start Time 1047    PT Stop Time 1130    PT Time Calculation (min) 43 min    Activity Tolerance Patient tolerated treatment well    Behavior During Therapy [facility] for tasks assessed/performed             Past Medical History:  Diagnosis Date   Anemia    Heart murmur    Hx of adenomatous colonic polyps 09/18/2017   Thalassanemia    Past Surgical History:  Procedure Laterality Date   ABDOMINAL HYSTERECTOMY     CESAREAN SECTION     COLONOSCOPY     Patient Active Problem List   Diagnosis Date Noted   Paresthesias 04/12/2023   Insomnia 04/12/2023   Nail discoloration 03/02/2023   Elevated blood pressure reading 09/22/2021   Iron overload syndrome 01/27/2021   Hx of adenomatous colonic polyps 09/18/2017   Hyperlipidemia 05/09/2012   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 She does notice that she sometimes have headaches at towards the end of the day when her BP is higher. She states that she feels okay with daily walking, but has not yet returned to walking longer distances for overall fitness.   EVAL: Her lower extremity challenges began  in July 2025. She states that the tingling in her legs has improved. However, she is feeling weakness, fatigue and a trembling/vibrating feeling in both of her legs. She states that trembling gets better when she sits or lays down. She is wearing compression socks according to physician's recommendation.    PERTINENT HISTORY: Relevant PMHx includes anemia, Thalassanemia minor, Heart murmur  Per referring provider "Her husband passed away on 2023-04-07.  She has been experiencing grief and poor sleep. She also has chronic weakness attributed to her anemia but felt worsening weakness in the legs. She has had tingling in her fingers and toes for about a year but started noticing involvement of her legs as well. No radicular pain in the legs. She feels off balance on her feet."   She recently starting taking metaprolol  PAIN:  Are you having pain? No   PRECAUTIONS: None  RED FLAGS: None   WEIGHT BEARING RESTRICTIONS: No  FALLS:  Has patient fallen in last 6 months? No  LIVING ENVIRONMENT: Lives with: lives alone Lives in: House/apartment Stairs: Yes, 1 flight with a railing  Has following equipment at home: None  OCCUPATION: retired; regular volunteering in the community   PLOF: Independent with household mobility without device and Independent with community mobility without device  PATIENT GOALS: Get back to more exercising and more walking   NEXT  MD VISIT: 03/11/24 with referring provider   OBJECTIVE:  Note: Objective measures were completed at Evaluation unless otherwise noted.  DIAGNOSTIC FINDINGS:   10/23/23 MRI Lumbar Spine w/o contrast   IMPRESSION: 1. At L3-4 there is a moderate disc bulge. Moderate bilateral facet arthropathy. Moderate central canal stenosis. 2. At L4-5 there is a mild disc bulge. Moderate bilateral facet arthropathy with ligamentum flavum thickening. Moderate central canal stenosis and bilateral lateral recess stenosis. 3. At L2-3 there is a  mild disc bulge. Moderate bilateral facet arthropathy. Mild bilateral foraminal narrowing. 4. No acute osseous injury of the lumbar spine.  PATIENT SURVEYS:  FOTO 63 current, 72 predicted  COGNITION: Overall cognitive status: Within functional limits for tasks assessed     POSTURE: flexed trunk  with prolonged sitting and with ambulation    LOWER EXTREMITY MMT:  MMT Right eval Left eval  Hip flexion 4 4+  Hip extension    Hip abduction 4+ 4+  Hip adduction 4+ 4+  Hip internal rotation 4- 3+  Hip external rotation 4 4-  Knee flexion 5 5  Knee extension 5 5  Ankle dorsiflexion    Ankle plantarflexion    Ankle inversion    Ankle eversion     (Blank rows = not tested)   FUNCTIONAL TESTS:  5 times sit to stand: 17 sec  2 minute walk test: **on 12/13/23  MCTSIB: Condition 1: Avg of 3 trials: 30 sec, Condition 2: Avg of 3 trials: 30 sec, Condition 3: Avg of 3 trials: 24 sec, Condition 4: Avg of 3 trials: 30 sec, and Total Score: 114/120  12/13/2023:  Tandem (even surface) 30 sec BIL  Tandem (uneven surface/foam) 14 sec L, 30 sec R  SLS (trial 1) R 12 sec, 7 sec   SLS (trail 2) L 7 sec, 4 sec    GAIT: Distance walked: 400 ft during , 12/13/2023 Assistive device utilized: None Comments: no significant gait deviations noted today                                                                                                                                  TREATMENT DATE:    [facility] Adult PT Treatment:                                                DATE: 12/13/2023  Therapeutic Activity: Patient education regarding PT diagnosis rationale, prognosis, utilizing collaborative approach to prioritizing treatment goals.  Formal gait assessment and Standing 3-way hip BIL from floor, x 10 each side  STS from low table, 2 x 10 with cueing to decreased knee valgus Standing marches x 10  Updated and reviewed HEP    [facility] Adult PT Treatment:  DATE: 11/30/2023   Initial evaluation: see patient education and home exercise program as noted below      PATIENT EDUCATION:  Education details: reviewed initial home exercise program; discussion of POC, prognosis and goals for skilled PT   Person educated: Patient Education method: Explanation, Demonstration, and Handouts Education comprehension: verbalized understanding, returned demonstration, and needs further education  HOME EXERCISE PROGRAM: Access Code: [facility] URL: [email or web address] Date: 12/13/2023 Prepared by: Mauri Reading  Exercises - Standing Hip Abduction with Counter Support  - 1 x daily - 7 x weekly - 1 sets - 10 reps - Standing Hip Extension with Counter Support  - 1 x daily - 7 x weekly - 1 sets - 10 reps - Standing Hip Flexion with Counter Support  - 2 x daily - 7 x weekly - 1 sets - 10 reps - Standing March with Counter Support  - 2 x daily - 7 x weekly - 1 sets - 10 reps - Sit to Stand Without Arm Support  - 2 x daily - 7 x weekly - 1 sets - 10 reps  ASSESSMENT:  CLINICAL IMPRESSION: 12/13/2023 Today's session focused on patient education regarding monitoring blood pressure and heart rate, including recommendation for utilizing a log to help monitor and update her cardiologist.  Additionally, he spent additional time with the gait and balance assessment today.  Hannah Landry's gait speed is fairly normal as related to gender and age normative values.  She does demonstrate difficulty with static standing balance with narrow base of support and single-leg stance.  She was able to begin initial exercises for hip strengthening, single-leg stance time, sit to stand mechanics.  Updated HEP and reviewed with patient.  We will continue to progress lower extremity strengthening and balance program at next visit.  EVAL: Hannah Landry is a 78 y.o. female who was seen today for physical therapy evaluation and treatment for Bilateral LE weakness and  difficulty walking. She denies presence of pain, but states that she has BIL LE paresthesias, with absence of LBP or paresthesias of the feet. Her current presentation is likely affected by presence of lumbar stenosis as noted in recent MRI results. She is demonstrating decreased LS AROM, decreased BIL LE MMT scores, and increased sit-to-stand time. She has related pain and difficulty with prolonged walking/standing, stair climbing, and lifting in order to perform household duties and participate in community activities. She requires skilled PT services at this time to address relevant deficits and improve overall function.     OBJECTIVE IMPAIRMENTS: cardiopulmonary status limiting activity, decreased activity tolerance, difficulty walking, decreased ROM, decreased strength, and postural dysfunction.   ACTIVITY LIMITATIONS: carrying, lifting, bending, standing, squatting, stairs, transfers, and locomotion level  PARTICIPATION LIMITATIONS: meal prep, cleaning, laundry, shopping, and community activity  PERSONAL FACTORS: Age, Past/current experiences, Time since onset of injury/illness/exacerbation, and 1-2 comorbidities: Anemia, Thalassanemia minor  are also affecting patient's functional outcome.   REHAB POTENTIAL: Fair    CLINICAL DECISION MAKING: Stable/uncomplicated  EVALUATION COMPLEXITY: Low   GOALS: Goals reviewed with patient? Yes  SHORT TERM GOALS: Target date: 12/21/2023  Patient will be independent with initial home program for LE strengthening and standing tolerance.  Baseline: written HEP to be provided at f/u visit  Goal status: INITIAL   LONG TERM GOALS: Target date: 01/11/2024   Patient will report improved overall functional ability with FOTO score of 70 or greater    Baseline: 63 Goal status: INITIAL  2.  Patient will demonstrate at least 4+/5 MMT throughout BIL LE  strength testing  Baseline: see objective measures Goal status: INITIAL  3.  Patient will decrease 5x  STS test to 15 seconds or less without using arm rests.   Baseline: 17 seconds using arm rest  Goal status: INITIAL  4. Patient will demonstrate ability to tolerate at least 20 minutes of standing and walking tasks prior to requiring seated rest break.   Baseline: unable to tolerate >5 minutes   Goal status: INITIAL     PLAN:  PT FREQUENCY: 1-2x/week  PT DURATION: 8 weeks  PLANNED INTERVENTIONS: 97164- PT Re-evaluation, 97110-Therapeutic exercises, 97530- Therapeutic activity, 97112- Neuromuscular re-education, 97535- Self Care, 91478- Manual therapy, 97014- Electrical stimulation (unattended), [id number]- Electrical stimulation (manual), [id number]- Traction (mechanical), Patient/Family education, Taping, Dry Needling, Spinal mobilization, Cryotherapy, and Moist heat  PLAN FOR NEXT SESSION: monitor vitals before activity; LE strengthening, static standing balance (even -> uneven surfaces) -> dynamic balance; patient education for increasing    Mauri Reading, PT, DPT  12/13/2023 7:05 PM

## 2023-12-14 ENCOUNTER — Ambulatory Visit: Payer: Medicare HMO

## 2023-12-14 DIAGNOSIS — M6281 Muscle weakness (generalized): Secondary | ICD-10-CM

## 2023-12-14 DIAGNOSIS — R262 Difficulty in walking, not elsewhere classified: Secondary | ICD-10-CM

## 2023-12-14 NOTE — Therapy (Signed)
OUTPATIENT PHYSICAL THERAPY NOTE   Patient Name: Hannah Landry MRN: [id number] DOB:10/11/1946, 78 y.o., female Today's Date: 12/14/2023  END OF SESSION:  PT End of Session - 12/14/23 1039     Visit Number 3    Number of Visits 12    Date for PT Re-Evaluation 01/11/24    Authorization Type [employer] MCR    PT Start Time 1045    PT Stop Time 1123    PT Time Calculation (min) 38 min    Activity Tolerance Patient tolerated treatment well    Behavior During Therapy WFL for tasks assessed/performed              Past Medical History:  Diagnosis Date   Anemia    Heart murmur    Hx of adenomatous colonic polyps 09/18/2017   Thalassanemia    Past Surgical History:  Procedure Laterality Date   ABDOMINAL HYSTERECTOMY     CESAREAN SECTION     COLONOSCOPY     Patient Active Problem List   Diagnosis Date Noted   Paresthesias 04/12/2023   Insomnia 04/12/2023   Nail discoloration 03/02/2023   Elevated blood pressure reading 09/22/2021   Iron overload syndrome 01/27/2021   Hx of adenomatous colonic polyps 09/18/2017   Hyperlipidemia 05/09/2012   Thalassemia Landry 05/09/2011    PCP: Lorayne Bender, MD  REFERRING PROVIDER: Drema Dallas, DO  REFERRING DIAG: Bilateral leg weakness [R29.898]   THERAPY DIAG:  Muscle weakness (generalized)  Difficulty in walking, not elsewhere classified  Rationale for Evaluation and Treatment: Rehabilitation  ONSET DATE: 3 months  SUBJECTIVE:   Vitals pre-exercise:  11/30/23: 60 bpm, 97% O2 sat, 124/55 mmHg 12/13/23: 63 bpm, 97% O2 sat, 127/58 mmHg  12/14/23: 69 bpm, 98% O2 sat, 127/58 mmHg  SUBJECTIVE STATEMENT:   Patient reports that she felt ok after previous session, no headaches yesterday.  EVAL: Her lower extremity challenges began in July 2025. She states that the tingling in her legs has improved. However, she is feeling weakness, fatigue and a trembling/vibrating feeling in both of her legs. She states that trembling gets better  when she sits or lays down. She is wearing compression socks according to physician's recommendation.    PERTINENT HISTORY: Relevant PMHx includes anemia, Thalassanemia Landry, Heart murmur  Per referring provider "Her husband passed away on 2023/03/20.  She has been experiencing grief and poor sleep. She also has chronic weakness attributed to her anemia but felt worsening weakness in the legs. She has had tingling in her fingers and toes for about a year but started noticing involvement of her legs as well. No radicular pain in the legs. She feels off balance on her feet."   She recently starting taking metaprolol  PAIN:  Are you having pain? No   PRECAUTIONS: None  RED FLAGS: None   WEIGHT BEARING RESTRICTIONS: No  FALLS:  Has patient fallen in last 6 months? No  LIVING ENVIRONMENT: Lives with: lives alone Lives in: House/apartment Stairs: Yes, 1 flight with a railing  Has following equipment at home: None  OCCUPATION: retired; regular volunteering in the community   PLOF: Independent with household mobility without device and Independent with community mobility without device  PATIENT GOALS: Get back to more exercising and more walking   NEXT MD VISIT: 03/11/24 with referring provider   OBJECTIVE:  Note: Objective measures were completed at Evaluation unless otherwise noted.  DIAGNOSTIC FINDINGS:   10/23/23 MRI Lumbar Spine w/o contrast   IMPRESSION: 1. At L3-4 there is  a moderate disc bulge. Moderate bilateral facet arthropathy. Moderate central canal stenosis. 2. At L4-5 there is a mild disc bulge. Moderate bilateral facet arthropathy with ligamentum flavum thickening. Moderate central canal stenosis and bilateral lateral recess stenosis. 3. At L2-3 there is a mild disc bulge. Moderate bilateral facet arthropathy. Mild bilateral foraminal narrowing. 4. No acute osseous injury of the lumbar spine.  PATIENT SURVEYS:  FOTO 63 current, 72  predicted  COGNITION: Overall cognitive status: Within functional limits for tasks assessed     POSTURE: flexed trunk  with prolonged sitting and with ambulation    LOWER EXTREMITY MMT:  MMT Right eval Left eval  Hip flexion 4 4+  Hip extension    Hip abduction 4+ 4+  Hip adduction 4+ 4+  Hip internal rotation 4- 3+  Hip external rotation 4 4-  Knee flexion 5 5  Knee extension 5 5  Ankle dorsiflexion    Ankle plantarflexion    Ankle inversion    Ankle eversion     (Blank rows = not tested)   FUNCTIONAL TESTS:  5 times sit to stand: 17 sec  2 minute walk test: 400 ft on 12/13/23  MCTSIB: Condition 1: Avg of 3 trials: 30 sec, Condition 2: Avg of 3 trials: 30 sec, Condition 3: Avg of 3 trials: 24 sec, Condition 4: Avg of 3 trials: 30 sec, and Total Score: 114/120  12/13/2023:  Tandem (even surface) 30 sec BIL  Tandem (uneven surface/foam) 14 sec L, 30 sec R  SLS (trial 1) R 12 sec, 7 sec   SLS (trail 2) L 7 sec, 4 sec    GAIT: Distance walked: 400 ft during , 12/13/2023 Assistive device utilized: None Comments: no significant gait deviations noted today                                                                                                                                  TREATMENT DATE:  [facility] Adult PT Treatment:                                                DATE: 12/14/23 Therapeutic Exercise: Nustep level 3 x 5 mins HR 86 post Standing 3-way hip BIL from floor, x 10 each side  Standing marches 2x30" STS from low table, 2 x 10 with cueing to decreased knee valgus, YTB around knees Seated hip adduction ball squeeze 5" hold 2x10 Neuromuscular Re-ed: Tandem stance on Airex x30" BIL Slow marching on Airex 2x30"  Hurdle step overs at counter x4 hurdles fwd/lat   [facility] Adult PT Treatment:  DATE: 12/13/2023  Therapeutic Activity: Patient education regarding PT diagnosis rationale, prognosis, utilizing  collaborative approach to prioritizing treatment goals.  Formal gait assessment and Standing 3-way hip BIL from floor, x 10 each side  STS from low table, 2 x 10 with cueing to decreased knee valgus Standing marches x 10  Updated and reviewed HEP    [facility] Adult PT Treatment:                                                DATE: 11/30/2023   Initial evaluation: see patient education and home exercise program as noted below      PATIENT EDUCATION:  Education details: reviewed initial home exercise program; discussion of POC, prognosis and goals for skilled PT   Person educated: Patient Education method: Explanation, Demonstration, and Handouts Education comprehension: verbalized understanding, returned demonstration, and needs further education  HOME EXERCISE PROGRAM: Access Code: [facility] URL: [email or web address] Date: 12/13/2023 Prepared by: Mauri Reading  Exercises - Standing Hip Abduction with Counter Support  - 1 x daily - 7 x weekly - 1 sets - 10 reps - Standing Hip Extension with Counter Support  - 1 x daily - 7 x weekly - 1 sets - 10 reps - Standing Hip Flexion with Counter Support  - 2 x daily - 7 x weekly - 1 sets - 10 reps - Standing March with Counter Support  - 2 x daily - 7 x weekly - 1 sets - 10 reps - Sit to Stand Without Arm Support  - 2 x daily - 7 x weekly - 1 sets - 10 reps  ASSESSMENT:  CLINICAL IMPRESSION: Patient presents to PT reporting no soreness or headaches after previous session. Session today focused on LE strengthening, endurance, and balance tasks. She will benefit from more challenging strengthening and balance tasks in future sessions. Vitals monitored as needed. Patient was able to tolerate all prescribed exercises with no adverse effects. Patient continues to benefit from skilled PT services and should be progressed as able to improve functional independence.    EVAL: Hannah Landry is a 78 y.o. female who was seen today for physical  therapy evaluation and treatment for Bilateral LE weakness and difficulty walking. She denies presence of pain, but states that she has BIL LE paresthesias, with absence of LBP or paresthesias of the feet. Her current presentation is likely affected by presence of lumbar stenosis as noted in recent MRI results. She is demonstrating decreased LS AROM, decreased BIL LE MMT scores, and increased sit-to-stand time. She has related pain and difficulty with prolonged walking/standing, stair climbing, and lifting in order to perform household duties and participate in community activities. She requires skilled PT services at this time to address relevant deficits and improve overall function.     OBJECTIVE IMPAIRMENTS: cardiopulmonary status limiting activity, decreased activity tolerance, difficulty walking, decreased ROM, decreased strength, and postural dysfunction.   ACTIVITY LIMITATIONS: carrying, lifting, bending, standing, squatting, stairs, transfers, and locomotion level  PARTICIPATION LIMITATIONS: meal prep, cleaning, laundry, shopping, and community activity  PERSONAL FACTORS: Age, Past/current experiences, Time since onset of injury/illness/exacerbation, and 1-2 comorbidities: Anemia, Thalassanemia Landry  are also affecting patient's functional outcome.   REHAB POTENTIAL: Fair    CLINICAL DECISION MAKING: Stable/uncomplicated  EVALUATION COMPLEXITY: Low   GOALS: Goals reviewed with patient? Yes  SHORT TERM GOALS: Target date: 12/21/2023  Patient  will be independent with initial home program for LE strengthening and standing tolerance.  Baseline: written HEP to be provided at f/u visit  Goal status: INITIAL   LONG TERM GOALS: Target date: 01/11/2024   Patient will report improved overall functional ability with FOTO score of 70 or greater    Baseline: 63 Goal status: INITIAL  2.  Patient will demonstrate at least 4+/5 MMT throughout BIL LE strength testing  Baseline: see objective  measures Goal status: INITIAL  3.  Patient will decrease 5x STS test to 15 seconds or less without using arm rests.   Baseline: 17 seconds using arm rest  Goal status: INITIAL  4. Patient will demonstrate ability to tolerate at least 20 minutes of standing and walking tasks prior to requiring seated rest break.   Baseline: unable to tolerate >5 minutes   Goal status: INITIAL     PLAN:  PT FREQUENCY: 1-2x/week  PT DURATION: 8 weeks  PLANNED INTERVENTIONS: 97164- PT Re-evaluation, 97110-Therapeutic exercises, 97530- Therapeutic activity, 97112- Neuromuscular re-education, 97535- Self Care, [id number]- Manual therapy, 97014- Electrical stimulation (unattended), [id number]- Electrical stimulation (manual), [id number]- Traction (mechanical), Patient/Family education, Taping, Dry Needling, Spinal mobilization, Cryotherapy, and Moist heat  PLAN FOR NEXT SESSION: monitor vitals before activity; LE strengthening, static standing balance (even -> uneven surfaces) -> dynamic balance; patient education for increasing    Hannah Landry PTA 12/14/2023 11:26 AM

## 2023-12-19 ENCOUNTER — Ambulatory Visit: Payer: Medicare HMO | Attending: Neurology

## 2023-12-19 DIAGNOSIS — M6281 Muscle weakness (generalized): Secondary | ICD-10-CM | POA: Insufficient documentation

## 2023-12-19 DIAGNOSIS — R262 Difficulty in walking, not elsewhere classified: Secondary | ICD-10-CM | POA: Diagnosis present

## 2023-12-19 NOTE — Therapy (Signed)
 OUTPATIENT PHYSICAL THERAPY NOTE   Patient Name: Hannah Landry MRN: [id number] DOB:[birth year], 78 y.o., female Today's Date: 12/19/2023  END OF SESSION:  PT End of Session - 12/19/23 1232     Visit Number 4    Number of Visits 12    Date for PT Re-Evaluation 01/11/24    Authorization Type [employer] MCR    PT Start Time 1215    PT Stop Time 1255    PT Time Calculation (min) 40 min    Activity Tolerance Patient tolerated treatment well    Behavior During Therapy Hannah Landry for tasks assessed/performed               Past Medical History:  Diagnosis Date   Anemia    Heart murmur    Hx of adenomatous colonic polyps 09/18/2017   Thalassanemia    Past Surgical History:  Procedure Laterality Date   ABDOMINAL HYSTERECTOMY     CESAREAN SECTION     COLONOSCOPY     Patient Active Problem List   Diagnosis Date Noted   Paresthesias 04/12/2023   Insomnia 04/12/2023   Nail discoloration 03/02/2023   Elevated blood pressure reading 09/22/2021   Iron overload syndrome 01/27/2021   Hx of adenomatous colonic polyps 09/18/2017   Hyperlipidemia 05/09/2012   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 She states that her BP has been more stable lately.   EVAL: Her lower extremity challenges began in July 2025. She states that the tingling in her legs has improved. However, she is feeling weakness, fatigue and a trembling/vibrating feeling in both  of her legs. She states that trembling gets better when she sits or lays down. She is wearing compression socks according to physician's recommendation.    PERTINENT HISTORY: Relevant PMHx includes anemia, Thalassanemia minor, Heart murmur  Per referring provider Her husband passed away on 17-Mar-2023.  She has been experiencing grief and poor sleep. She also has chronic weakness attributed to her anemia but felt worsening weakness in the legs. She has had tingling in her fingers and toes for about a year but started noticing involvement of her legs as well. No radicular pain in the legs. She feels off balance on her feet.   She recently starting taking metaprolol  PAIN:  Are you having pain? No   PRECAUTIONS: None  RED FLAGS: None   WEIGHT BEARING RESTRICTIONS: No  FALLS:  Has patient fallen in last 6 months? No  LIVING ENVIRONMENT: Lives with: lives alone Lives in: House/apartment Stairs: Yes, 1 flight with a railing  Has following equipment at home: None  OCCUPATION: retired; regular volunteering in the community   PLOF: Independent with household mobility without device and Independent with community mobility without device  PATIENT GOALS: Get back to more exercising and more walking   NEXT MD VISIT: 03/11/24 with referring provider   OBJECTIVE:  Note: Objective measures were completed at Evaluation unless otherwise noted.  DIAGNOSTIC FINDINGS:   10/23/23 MRI  Lumbar Spine w/o contrast   IMPRESSION: 1. At L3-4 there is a moderate disc bulge. Moderate bilateral facet arthropathy. Moderate central canal stenosis. 2. At L4-5 there is a mild disc bulge. Moderate bilateral facet arthropathy with ligamentum flavum thickening. Moderate central canal stenosis and bilateral lateral recess stenosis. 3. At L2-3 there is a mild disc bulge. Moderate bilateral facet arthropathy. Mild bilateral foraminal narrowing. 4. No acute osseous injury of the lumbar spine.  PATIENT  SURVEYS:  FOTO 63 current, 72 predicted  COGNITION: Overall cognitive status: Within functional limits for tasks assessed     POSTURE: flexed trunk  with prolonged sitting and with ambulation    LOWER EXTREMITY MMT:  MMT Right eval Left eval  Hip flexion 4 4+  Hip extension    Hip abduction 4+ 4+  Hip adduction 4+ 4+  Hip internal rotation 4- 3+  Hip external rotation 4 4-  Knee flexion 5 5  Knee extension 5 5  Ankle dorsiflexion    Ankle plantarflexion    Ankle inversion    Ankle eversion     (Blank rows = not tested)   FUNCTIONAL TESTS:  5 times sit to stand: 17 sec  2 minute walk test: 400 ft on 12/13/23  MCTSIB: Condition 1: Avg of 3 trials: 30 sec, Condition 2: Avg of 3 trials: 30 sec, Condition 3: Avg of 3 trials: 24 sec, Condition 4: Avg of 3 trials: 30 sec, and Total Score: 114/120  12/13/2023:  Tandem (even surface) 30 sec BIL  Tandem (uneven surface/foam) 14 sec L, 30 sec R  SLS (trial 1) R 12 sec, 7 sec   SLS (trail 2) L 7 sec, 4 sec    GAIT: Distance walked: 400 ft during , 12/13/2023 Assistive device utilized: None Comments: no significant gait deviations noted today                                                                                                                                 TREATMENT DATE:  [facility] Adult PT Treatment:                                                DATE: 12/19/23  Therapeutic Exercise: Nustep level 4 x 5 mins  After: HR 85 bpm  Seated hip adduction p-ring 5 hold 2x10  Neuromuscular re-ed: Tandem stance on Airex 2 x30 BIL, 2# ankle weight  Slow marching on Airex 2x30, 2# ankle weight   Therapeutic Activity: Standing 3-way hip BIL from airex 2 x 10 each side  Hurdle (4) step overs at counter  2 laps fwd  2 laps lateral  STS from low table, 2 x 10 with cueing to decreased knee valgus, red TB around knees Patient education regarding strategies for increasing challenge over next few visits; and planning  for  decreased frequency of visits   [facility] Adult PT Treatment:                                                DATE: 12/14/23 Therapeutic Exercise: Nustep level 3 x 5 mins HR 86 post Standing 3-way hip BIL from floor, x 10 each side  Standing marches 2x30 STS from low table, 2 x 10 with cueing to decreased knee valgus, YTB around knees Seated hip adduction ball squeeze 5 hold 2x10 Neuromuscular Re-ed: Tandem stance on Airex x30 BIL Slow marching on Airex 2x30  Hurdle step overs at counter x4 hurdles fwd/lat   [facility] Adult PT Treatment:                                                DATE: 12/13/2023  Therapeutic Activity: Patient education regarding PT diagnosis rationale, prognosis, utilizing collaborative approach to prioritizing treatment goals.  Formal gait assessment and Standing 3-way hip BIL from floor, x 10 each side  STS from low table, 2 x 10 with cueing to decreased knee valgus Standing marches x 10  Updated and reviewed HEP    [facility] Adult PT Treatment:                                                DATE: 11/30/2023   Initial evaluation: see patient education and home exercise program as noted below      PATIENT EDUCATION:  Education details: reviewed initial home exercise program; discussion of POC, prognosis and goals for skilled PT   Person educated: Patient Education method: Explanation, Demonstration, and Handouts Education comprehension: verbalized understanding, returned demonstration, and needs further education  HOME EXERCISE PROGRAM: Access Code: [facility] URL: [email or web address] Date: 12/13/2023 Prepared by: Marko Molt  Exercises - Standing Hip Abduction with Counter Support  - 1 x daily - 7 x weekly - 1 sets - 10 reps - Standing Hip Extension with Counter Support  - 1 x daily - 7 x weekly - 1 sets - 10 reps - Standing Hip Flexion with Counter Support  - 2 x daily - 7 x weekly - 1 sets - 10 reps - Standing March with Counter Support   - 2 x daily - 7 x weekly - 1 sets - 10 reps - Sit to Stand Without Arm Support  - 2 x daily - 7 x weekly - 1 sets - 10 reps  ASSESSMENT:  CLINICAL IMPRESSION: Patient was able to tolerate progression of exercises today for increased challenge to SLS balance and LE strength. She was demonstrating circumduction BIL with hurdle navigation, which improved during second lap following cues from clinician.  Vitals monitored as needed, at end of session vitals were as follows: 95% O2 sat, 131/60 bpm, 72 bpm. Patient continues to benefit from skilled PT services and should be progressed as able to improve functional independence.    EVAL: Alizea is a 78 y.o. female who was seen today for physical therapy evaluation and treatment for Bilateral LE weakness and difficulty walking. She denies presence of pain, but states that she has  BIL LE paresthesias, with absence of LBP or paresthesias of the feet. Her current presentation is likely affected by presence of lumbar stenosis as noted in recent MRI results. She is demonstrating decreased LS AROM, decreased BIL LE MMT scores, and increased sit-to-stand time. She has related pain and difficulty with prolonged walking/standing, stair climbing, and lifting in order to perform household duties and participate in community activities. She requires skilled PT services at this time to address relevant deficits and improve overall function.     OBJECTIVE IMPAIRMENTS: cardiopulmonary status limiting activity, decreased activity tolerance, difficulty walking, decreased ROM, decreased strength, and postural dysfunction.   ACTIVITY LIMITATIONS: carrying, lifting, bending, standing, squatting, stairs, transfers, and locomotion level  PARTICIPATION LIMITATIONS: meal prep, cleaning, laundry, shopping, and community activity  PERSONAL FACTORS: Age, Past/current experiences, Time since onset of injury/illness/exacerbation, and 1-2 comorbidities: Anemia, Thalassanemia minor  are  also affecting patient's functional outcome.   REHAB POTENTIAL: Fair    CLINICAL DECISION MAKING: Stable/uncomplicated  EVALUATION COMPLEXITY: Low   GOALS: Goals reviewed with patient? Yes  SHORT TERM GOALS: Target date: 12/21/2023  Patient will be independent with initial home program for LE strengthening and standing tolerance.  Baseline: written HEP to be provided at f/u visit  Goal status: INITIAL   LONG TERM GOALS: Target date: 01/11/2024   Patient will report improved overall functional ability with FOTO score of 70 or greater    Baseline: 63 Goal status: INITIAL  2.  Patient will demonstrate at least 4+/5 MMT throughout BIL LE strength testing  Baseline: see objective measures Goal status: INITIAL  3.  Patient will decrease 5x STS test to 15 seconds or less without using arm rests.   Baseline: 17 seconds using arm rest  Goal status: INITIAL  4. Patient will demonstrate ability to tolerate at least 20 minutes of standing and walking tasks prior to requiring seated rest break.   Baseline: unable to tolerate >5 minutes   Goal status: INITIAL     PLAN:  PT FREQUENCY: 1-2x/week  PT DURATION: 8 weeks  PLANNED INTERVENTIONS: 97164- PT Re-evaluation, 97110-Therapeutic exercises, 97530- Therapeutic activity, 97112- Neuromuscular re-education, 97535- Self Care, [id number]- Manual therapy, 97014- Electrical stimulation (unattended), [id number]- Electrical stimulation (manual), [id number]- Traction (mechanical), Patient/Family education, Taping, Dry Needling, Spinal mobilization, Cryotherapy, and Moist heat  PLAN FOR NEXT SESSION: monitor vitals; LE strengthening, static standing balance (even -> uneven surfaces) -> dynamic balance; patient education for increasing    Marko Molt, PT, DPT  12/19/2023 1:03 PM

## 2023-12-21 ENCOUNTER — Ambulatory Visit: Payer: Medicare HMO

## 2023-12-21 DIAGNOSIS — R262 Difficulty in walking, not elsewhere classified: Secondary | ICD-10-CM

## 2023-12-21 DIAGNOSIS — M6281 Muscle weakness (generalized): Secondary | ICD-10-CM | POA: Diagnosis not present

## 2023-12-21 NOTE — Therapy (Signed)
 OUTPATIENT PHYSICAL THERAPY NOTE   Patient Name: Hailie Searight MRN: [id number] DOB:1945/12/21, 78 y.o., female Today's Date: 12/21/2023  END OF SESSION:  PT End of Session - 12/21/23 1145     Visit Number 5    Number of Visits 12    Date for PT Re-Evaluation 01/11/24    Authorization Type [employer] MCR    PT Start Time 1135    PT Stop Time 1213    PT Time Calculation (min) 38 min    Activity Tolerance Patient tolerated treatment well    Behavior During Therapy [facility] for tasks assessed/performed                Past Medical History:  Diagnosis Date   Anemia    Heart murmur    Hx of adenomatous colonic polyps 09/18/2017   Thalassanemia    Past Surgical History:  Procedure Laterality Date   ABDOMINAL HYSTERECTOMY     CESAREAN SECTION     COLONOSCOPY     Patient Active Problem List   Diagnosis Date Noted   Paresthesias 04/12/2023   Insomnia 04/12/2023   Nail discoloration 03/02/2023   Elevated blood pressure reading 09/22/2021   Iron overload syndrome 01/27/2021   Hx of adenomatous colonic polyps 09/18/2017   Hyperlipidemia 05/09/2012   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    PCP: Adele Song, MD  REFERRING PROVIDER: Skeet Juliene SAUNDERS, DO  REFERRING DIAG: Bilateral leg weakness [R29.898]   THERAPY DIAG:  Muscle weakness (generalized)  Difficulty in walking, not elsewhere classified  Rationale for Evaluation and Treatment: Rehabilitation  ONSET DATE: 3 months  SUBJECTIVE:   Vitals pre-exercise:  11/30/23: 60 bpm, 97% O2 sat, 124/55 mmHg 12/13/23: 63 bpm, 97% O2 sat, 127/58 mmHg  12/14/23: 69 bpm, 98% O2 sat, 127/58 mmHg 12/21/23: 136/56 mmHg (auto cuff), 130/60 mmHg (manually)   SUBJECTIVE STATEMENT:   Patient reporting that she would like to try more challenging exercises today. She states that her BP has been more stable lately.  EVAL: Her lower extremity challenges began in July 2025. She states that the tingling in her legs has improved. However, she is feeling  weakness, fatigue and a trembling/vibrating feeling in both of her legs. She states that trembling gets better when she sits or lays down. She is wearing compression socks according to physician's recommendation.    PERTINENT HISTORY: Relevant PMHx includes anemia, Thalassanemia minor, Heart murmur  Per referring provider Her husband passed away on 03/27/2023.  She has been experiencing grief and poor sleep. She also has chronic weakness attributed to her anemia but felt worsening weakness in the legs. She has had tingling in her fingers and toes for about a year but started noticing involvement of her legs as well. No radicular pain in the legs. She feels off balance on her feet.   She recently starting taking metaprolol  PAIN:  Are you having pain? No   PRECAUTIONS: None  RED FLAGS: None   WEIGHT BEARING RESTRICTIONS: No  FALLS:  Has patient fallen in last 6 months? No  LIVING ENVIRONMENT: Lives with: lives alone Lives in: House/apartment Stairs: Yes, 1 flight with a railing  Has following equipment at home: None  OCCUPATION: retired; regular volunteering in the community   PLOF: Independent with household mobility without device and Independent with community mobility without device  PATIENT GOALS: Get back to more exercising and more walking   NEXT MD VISIT: 03/11/24 with referring provider   OBJECTIVE:  Note: Objective measures were completed at Evaluation unless  otherwise noted.  DIAGNOSTIC FINDINGS:   10/23/23 MRI Lumbar Spine w/o contrast   IMPRESSION: 1. At L3-4 there is a moderate disc bulge. Moderate bilateral facet arthropathy. Moderate central canal stenosis. 2. At L4-5 there is a mild disc bulge. Moderate bilateral facet arthropathy with ligamentum flavum thickening. Moderate central canal stenosis and bilateral lateral recess stenosis. 3. At L2-3 there is a mild disc bulge. Moderate bilateral facet arthropathy. Mild bilateral foraminal narrowing. 4.  No acute osseous injury of the lumbar spine.  PATIENT SURVEYS:  FOTO 63 current, 72 predicted  COGNITION: Overall cognitive status: Within functional limits for tasks assessed     POSTURE: flexed trunk  with prolonged sitting and with ambulation    LOWER EXTREMITY MMT:  MMT Right eval Left eval  Hip flexion 4 4+  Hip extension    Hip abduction 4+ 4+  Hip adduction 4+ 4+  Hip internal rotation 4- 3+  Hip external rotation 4 4-  Knee flexion 5 5  Knee extension 5 5  Ankle dorsiflexion    Ankle plantarflexion    Ankle inversion    Ankle eversion     (Blank rows = not tested)   FUNCTIONAL TESTS:  5 times sit to stand: 17 sec  2 minute walk test: 400 ft on 12/13/23  MCTSIB: Condition 1: Avg of 3 trials: 30 sec, Condition 2: Avg of 3 trials: 30 sec, Condition 3: Avg of 3 trials: 24 sec, Condition 4: Avg of 3 trials: 30 sec, and Total Score: 114/120  12/13/2023:  Tandem (even surface) 30 sec BIL  Tandem (uneven surface/foam) 14 sec L, 30 sec R  SLS (trial 1) R 12 sec, 7 sec   SLS (trail 2) L 7 sec, 4 sec    GAIT: Distance walked: 400 ft during , 12/13/2023 Assistive device utilized: None Comments: no significant gait deviations noted today                                                                                                                                 TREATMENT DATE:  [facility] Adult PT Treatment:                                                DATE: 12/21/23   Therapeutic Exercise: Nustep level 4 x 6 mins  After: HR 78 bpm  Seated hip abduction red TB 2 x 10   Neuromuscular re-ed: Tandem stance on Airex 3 x30 BIL, 2.5# ankle weight  Slow marching on Airex 3x30, 2.5# ankle weight  Updated HEP and reviewed   Therapeutic Activity: Standing 3-way hip BIL from airex x 10 each side, 2.5# ankle weight  Hurdle (4) step overs at counter with 2.5# ankle weight  2 laps fwd  2 laps lateral  STS from low table, 2 x 10  with red TB around knees to decrease  knee valgus  Patient education regarding strategies for increasing challenge over next few visits; expectations for response to treatment session, monitoring of vitals and and providing related patient education  [facility] Adult PT Treatment:                                                DATE: 12/19/23  Therapeutic Exercise: Nustep level 4 x 5 mins  After: HR 85 bpm  Seated hip adduction p-ring 5 hold 2x10  Neuromuscular re-ed: Tandem stance on Airex 2 x30 BIL, 2# ankle weight  Slow marching on Airex 2x30, 2# ankle weight   Therapeutic Activity: Standing 3-way hip BIL from airex 2 x 10 each side  Hurdle (4) step overs at counter  2 laps fwd  2 laps lateral  STS from low table, 2 x 10 with cueing to decreased knee valgus, red TB around knees Patient education regarding strategies for increasing challenge over next few visits; and planning for decreased frequency of visits   [facility] Adult PT Treatment:                                                DATE: 12/14/23 Therapeutic Exercise: Nustep level 3 x 5 mins HR 86 post Standing 3-way hip BIL from floor, x 10 each side  Standing marches 2x30 STS from low table, 2 x 10 with cueing to decreased knee valgus, YTB around knees Seated hip adduction ball squeeze 5 hold 2x10 Neuromuscular Re-ed: Tandem stance on Airex x30 BIL Slow marching on Airex 2x30  Hurdle step overs at counter x4 hurdles fwd/lat   [facility] Adult PT Treatment:                                                DATE: 12/13/2023  Therapeutic Activity: Patient education regarding PT diagnosis rationale, prognosis, utilizing collaborative approach to prioritizing treatment goals.  Formal gait assessment and Standing 3-way hip BIL from floor, x 10 each side  STS from low table, 2 x 10 with cueing to decreased knee valgus Standing marches x 10  Updated and reviewed HEP    [facility] Adult PT Treatment:                                                DATE: 11/30/2023   Initial  evaluation: see patient education and home exercise program as noted below      PATIENT EDUCATION:  Education details: reviewed initial home exercise program; discussion of POC, prognosis and goals for skilled PT   Person educated: Patient Education method: Explanation, Demonstration, and Handouts Education comprehension: verbalized understanding, returned demonstration, and needs further education  HOME EXERCISE PROGRAM: Access Code: [facility] URL: [email or web address] Date: 12/13/2023 Prepared by: Marko Molt  Exercises - Standing Hip Abduction with Counter Support  - 1 x daily - 7 x weekly - 1 sets - 10 reps - Standing Hip Extension with Counter Support  -  1 x daily - 7 x weekly - 1 sets - 10 reps - Standing Hip Flexion with Counter Support  - 2 x daily - 7 x weekly - 1 sets - 10 reps - Standing March with Counter Support  - 2 x daily - 7 x weekly - 1 sets - 10 reps - Sit to Stand Without Arm Support  - 2 x daily - 7 x weekly - 1 sets - 10 reps  ASSESSMENT:  CLINICAL IMPRESSION: Patient was able to tolerate progression of strength and balance exercises well today. She had improved mechanics with STS using red TB above knees. Vitals monitored as needed: prior to treatment BP was 130/62 mmHg at end of session HR was 78 bpm. Patient continues to benefit from skilled PT services and should be progressed as able to improve functional independence.    EVAL: Maeson is a 78 y.o. female who was seen today for physical therapy evaluation and treatment for Bilateral LE weakness and difficulty walking. She denies presence of pain, but states that she has BIL LE paresthesias, with absence of LBP or paresthesias of the feet. Her current presentation is likely affected by presence of lumbar stenosis as noted in recent MRI results. She is demonstrating decreased LS AROM, decreased BIL LE MMT scores, and increased sit-to-stand time. She has related pain and difficulty with prolonged  walking/standing, stair climbing, and lifting in order to perform household duties and participate in community activities. She requires skilled PT services at this time to address relevant deficits and improve overall function.     OBJECTIVE IMPAIRMENTS: cardiopulmonary status limiting activity, decreased activity tolerance, difficulty walking, decreased ROM, decreased strength, and postural dysfunction.   ACTIVITY LIMITATIONS: carrying, lifting, bending, standing, squatting, stairs, transfers, and locomotion level  PARTICIPATION LIMITATIONS: meal prep, cleaning, laundry, shopping, and community activity  PERSONAL FACTORS: Age, Past/current experiences, Time since onset of injury/illness/exacerbation, and 1-2 comorbidities: Anemia, Thalassanemia minor  are also affecting patient's functional outcome.   REHAB POTENTIAL: Fair    CLINICAL DECISION MAKING: Stable/uncomplicated  EVALUATION COMPLEXITY: Low   GOALS: Goals reviewed with patient? Yes  SHORT TERM GOALS: Target date: 12/21/2023  Patient will be independent with initial home program for LE strengthening and standing tolerance.  Baseline: written HEP to be provided at f/u visit  Goal status: INITIAL   LONG TERM GOALS: Target date: 01/11/2024   Patient will report improved overall functional ability with FOTO score of 70 or greater    Baseline: 63 Goal status: INITIAL  2.  Patient will demonstrate at least 4+/5 MMT throughout BIL LE strength testing  Baseline: see objective measures Goal status: INITIAL  3.  Patient will decrease 5x STS test to 15 seconds or less without using arm rests.   Baseline: 17 seconds using arm rest  Goal status: INITIAL  4. Patient will demonstrate ability to tolerate at least 20 minutes of standing and walking tasks prior to requiring seated rest break.   Baseline: unable to tolerate >5 minutes   Goal status: INITIAL     PLAN:  PT FREQUENCY: 1-2x/week  PT DURATION: 8 weeks  PLANNED  INTERVENTIONS: 97164- PT Re-evaluation, 97110-Therapeutic exercises, 97530- Therapeutic activity, 97112- Neuromuscular re-education, 97535- Self Care, [id number]- Manual therapy, 97014- Electrical stimulation (unattended), [id number]- Electrical stimulation (manual), [id number]- Traction (mechanical), Patient/Family education, Taping, Dry Needling, Spinal mobilization, Cryotherapy, and Moist heat  PLAN FOR NEXT SESSION: monitor vitals; LE strengthening, static standing balance (even -> uneven surfaces) -> dynamic balance; patient education for increasing  Marko Molt, PT, DPT  12/21/2023 1:21 PM

## 2023-12-26 ENCOUNTER — Ambulatory Visit: Payer: Medicare HMO

## 2023-12-28 ENCOUNTER — Ambulatory Visit: Payer: Medicare HMO

## 2023-12-29 ENCOUNTER — Telehealth (HOSPITAL_BASED_OUTPATIENT_CLINIC_OR_DEPARTMENT_OTHER): Payer: Self-pay | Admitting: *Deleted

## 2023-12-29 NOTE — Telephone Encounter (Signed)
Dr Antoine Poche asked that Dr Duke Salvia see patient  Dr Duke Salvia can see patient 2/25 at end of day   Left message to call back

## 2024-01-01 NOTE — Telephone Encounter (Signed)
Pt returning nurses call from Friday. Please advise

## 2024-01-01 NOTE — Telephone Encounter (Signed)
 Spoke with patient, scheduled visit for 2/25 at 9:20 am  Patient aware of date, time, and location

## 2024-01-09 ENCOUNTER — Encounter (HOSPITAL_BASED_OUTPATIENT_CLINIC_OR_DEPARTMENT_OTHER): Payer: Self-pay | Admitting: Cardiovascular Disease

## 2024-01-09 ENCOUNTER — Ambulatory Visit (HOSPITAL_BASED_OUTPATIENT_CLINIC_OR_DEPARTMENT_OTHER): Payer: Medicare HMO | Admitting: Cardiovascular Disease

## 2024-01-09 VITALS — BP 124/54 | HR 65 | Ht 61.0 in | Wt 137.0 lb

## 2024-01-09 DIAGNOSIS — I10 Essential (primary) hypertension: Secondary | ICD-10-CM

## 2024-01-09 DIAGNOSIS — Z5181 Encounter for therapeutic drug level monitoring: Secondary | ICD-10-CM | POA: Diagnosis not present

## 2024-01-09 DIAGNOSIS — I2510 Atherosclerotic heart disease of native coronary artery without angina pectoris: Secondary | ICD-10-CM | POA: Diagnosis not present

## 2024-01-09 DIAGNOSIS — E785 Hyperlipidemia, unspecified: Secondary | ICD-10-CM

## 2024-01-09 HISTORY — DX: Atherosclerotic heart disease of native coronary artery without angina pectoris: I25.10

## 2024-01-09 MED ORDER — ROSUVASTATIN CALCIUM 10 MG PO TABS: 10.0000 mg | ORAL_TABLET | Freq: Every day | ORAL | 3 refills | Status: AC

## 2024-01-09 NOTE — Progress Notes (Signed)
 Cardiology Office Note:  .   Date:  01/09/2024  ID:  Hannah Landry, DOB Jan 21, 1946, MRN [id number] PCP: Hannah Bender, MD  Hannah Landry Cardiologist:  Hannah Fus, MD    History of Present Illness: .    Hannah Landry is a 78 y.o. female with non-obstructive CAD, hypertension, hyperlipidemia and thalassemia minor here for follow up.  She saw Dr. Wyline Landry 04/2023 for and noted chest pain.  She was seen in the ED with an episode of chest pain and elevated blood pressure.  Her husband recently passed 6 months prior.  She described chest heaviness and was also struggling with elevated blood pressures.  Cardiac enzymes were negative.  Chest CTA was negative for pulmonary embolism.  There were no coronary calcifications.  Echo 06/2023 revealed LVEF 55-60% with mild mitral digitation.  Diastolic function was indeterminate.  She noted some lower extremity edema.  Right atrial pressure was 3 mmHg and her edema was not thought to be cardiac in nature.  Cardiac enzymes were negative and her chest pain was not thought to be due to ischemia.  She did note some  Hannah Landry experiences fluctuations in her blood pressure, with diastolic pressure occasionally dropping to the high fifties without causing lightheadedness or dizziness. On January 11th, she had a spike to 200/99, accompanied by a headache and chest heaviness. These spikes occur about three times a month, sometimes with minimal exertion.  She is currently taking metoprolol and amlodipine for hypertension. Her blood pressure is higher in the mornings, around 130 to 140 systolic, but decreases after taking her medication with breakfast. She manages her blood pressure through diet and exercise, aiming for 4,000 to 5,000 steps daily, with plans to increase this as the weather improves. She is mindful of her salt intake and primarily cooks at home.  She experiences fluid retention, particularly in her hands, ankles, and feet, which she attributes to  her medications. She manages this by drinking water and elevating her legs. She also reports some weight gain, which she believes is due to fluid retention.  She has a history of thalassemia minor, which influences her dietary choices, particularly her intake of red meat to manage her anemia. Her hemoglobin levels have been around 9, but increased to above 10 last year with increased red meat consumption.  She reports not sleeping well and has previously tried trazodone 50 mg without significant improvement. She is concerned about taking sleep aids with her current medications. No palpitations, heart racing, or skipping beats. No regular chest pain or pressure, only during blood pressure spikes. Mild headaches occasionally, associated with her medications.     ROS:  As per HPI  Studies Reviewed: .       Echo 06/2023: IMPRESSIONS    1. Left ventricular ejection fraction, by estimation, is 55 to 60%. The  left ventricle has normal function. The left ventricle has no regional  wall motion abnormalities. Left ventricular diastolic parameters are  indeterminate. The average left  ventricular global longitudinal strain is -23.2 %. The global longitudinal  strain is normal.   2. Right ventricular systolic function is normal. The right ventricular  size is normal. There is normal pulmonary artery systolic pressure.   3. The mitral valve is normal in structure. Mild mitral valve  regurgitation. No evidence of mitral stenosis.   4. The aortic valve is normal in structure. Aortic valve regurgitation is  trivial. No aortic stenosis is present.   5. The inferior vena cava is  normal in size with greater than 50%  respiratory variability, suggesting right atrial pressure of 3 mmHg.   Risk Assessment/Calculations:             Physical Exam:   VS:  BP (!) 124/54   Pulse 65   Ht 5\' 1"  (1.549 m)   Wt 137 lb (62.1 kg)   SpO2 99%   BMI 25.89 kg/m  , BMI Body mass index is 25.89 kg/m. GENERAL:   Well appearing HEENT: Pupils equal round and reactive, fundi not visualized, oral mucosa unremarkable NECK:  No jugular venous distention, waveform within normal limits, carotid upstroke brisk and symmetric, no bruits, no thyromegaly LUNGS:  Clear to auscultation bilaterally HEART:  RRR.  PMI not displaced or sustained,S1 and S2 within normal limits, no S3, no S4, no clicks, no rubs, no murmurs ABD:  Flat, positive bowel sounds normal in frequency in pitch, no bruits, no rebound, no guarding, no midline pulsatile mass, no hepatomegaly, no splenomegaly EXT:  2 plus pulses throughout, no edema, no cyanosis no clubbing SKIN:  No rashes no nodules NEURO:  Cranial nerves II through XII grossly intact, motor grossly intact throughout PSYCH:  Cognitively intact, oriented to person place and time  ASSESSMENT AND PLAN: .    # Hypertension Blood pressure fluctuates with occasional spikes causing headache and chest heaviness. No symptoms with low blood pressure readings. Currently on Metoprolol and Amlodipine. -Continue current medications. -Encourage regular exercise and low salt diet. -Monitor blood pressure and report any significant changes.  # Coronary Artery Disease # Hyperlipidemia On my review of her chest CT images today, calcification noted in a critical area of the coronary arteries on CT scan. LDL cholesterol slightly elevated at 96. -Start Rosuvastatin 10mg  daily.  LDL goal <70. -Check comprehensive metabolic panel and repeat cholesterol levels in three months.  # Fluid Retention Patient reports fluid retention, likely medication-related. No signs of heart failure or kidney disease. -Continue current medications. -Report if fluid retention worsens, particularly in the summer.  # Insomnia Patient reports difficulty sleeping. Previously tried Trazodone 50mg  with limited effect. -Consider discussing further with PCP. -Trial of over-the-counter Melatonin suggested.  Follow-up in six  months.      Signed, Hannah Si, MD

## 2024-01-09 NOTE — Patient Instructions (Signed)
 Medication Instructions:  START ROSUVASTATIN 10  MG DAILY   *If you need a refill on your cardiac medications before your next appointment, please call your pharmacy*  Lab Work: FASTING LP/CMET IN 3 MONTHS   If you have labs (blood work) drawn today and your tests are completely normal, you will receive your results only by: MyChart Message (if you have MyChart) OR A paper copy in the mail If you have any lab test that is abnormal or we need to change your treatment, we will call you to review the results.  Testing/Procedures: NONE  Follow-Up: At [facility], you and your health needs are our priority.  As part of our continuing mission to provide you with exceptional heart care, we have created designated Provider Care Teams.  These Care Teams include your primary Cardiologist (physician) and Advanced Practice Providers (APPs -  Physician Assistants and Nurse Practitioners) who all work together to provide you with the care you need, when you need it.  We recommend signing up for the patient portal called "MyChart".  Sign up information is provided on this After Visit Summary.  MyChart is used to connect with patients for Virtual Visits (Telemedicine).  Patients are able to view lab/test results, encounter notes, upcoming appointments, etc.  Non-urgent messages can be sent to your provider as well.   To learn more about what you can do with MyChart, go to [email or web address].    Your next appointment:   6 month(s)  Provider:   Chilton Si, MD

## 2024-01-11 ENCOUNTER — Ambulatory Visit: Payer: Medicare HMO

## 2024-01-11 DIAGNOSIS — R262 Difficulty in walking, not elsewhere classified: Secondary | ICD-10-CM

## 2024-01-11 DIAGNOSIS — M6281 Muscle weakness (generalized): Secondary | ICD-10-CM

## 2024-01-11 NOTE — Therapy (Signed)
 OUTPATIENT PHYSICAL DISCHARGE NOTE  Patient Name: Rajanee Schuelke MRN: [id number] DOB:1946-04-17, 78 y.o., female Today's Date: 01/11/2024   PHYSICAL THERAPY DISCHARGE SUMMARY  Visits from Start of Care: 6   Current functional level related to goals / functional outcomes: See objective findings/assessment  Remaining deficits: See objective findings/assessment    Education / Equipment: See today's treatment/assessment      Patient agrees to discharge. Patient goals were met. Patient is being discharged due to meeting the stated rehab goals.    END OF SESSION:  PT End of Session - 01/11/24 1357     Visit Number 6    Number of Visits 12    Date for PT Re-Evaluation 01/11/24    Authorization Type [employer] MCR    PT Start Time 1400    PT Stop Time 1425    PT Time Calculation (min) 25 min    Behavior During Therapy [facility] for tasks assessed/performed             PT End of Session - 01/11/24 1357     Visit Number 6    Number of Visits 12    Date for PT Re-Evaluation 01/11/24    Authorization Type [employer] MCR    PT Start Time 1400    PT Stop Time 1425    PT Time Calculation (min) 25 min    Behavior During Therapy [facility] for tasks assessed/performed              Past Medical History:  Diagnosis Date   Anemia    CAD in native artery 01/09/2024   Heart murmur    Hx of adenomatous colonic polyps 09/18/2017   Primary hypertension 09/22/2021   Primary hypertension 09/22/2021   Thalassanemia    Past Surgical History:  Procedure Laterality Date   ABDOMINAL HYSTERECTOMY     CESAREAN SECTION     COLONOSCOPY     Patient Active Problem List   Diagnosis Date Noted   CAD in native artery 01/09/2024   Paresthesias 04/12/2023   Insomnia 04/12/2023   Nail discoloration 03/02/2023   Primary hypertension 09/22/2021   Iron overload syndrome 01/27/2021   Hx of adenomatous colonic polyps 09/18/2017   Hyperlipidemia 05/09/2012   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    PCP: Lorayne Bender, MD  REFERRING PROVIDER: Drema Dallas, DO  REFERRING DIAG: Bilateral leg weakness [R29.898]   THERAPY DIAG:  Muscle weakness (generalized)  Difficulty in walking, not elsewhere classified  Rationale for Evaluation and Treatment: Rehabilitation  ONSET DATE: 3 months  SUBJECTIVE:    SUBJECTIVE STATEMENT:   01/11/2024 It has been about 3 weeks since patient was last seen in our clinic. She states this happened d/t weather. She feels that she has been doing well since last visit and is walking more often, up to 25 minutes. She plans to continue with her PT exercises and increased walking for health goals.     PERTINENT HISTORY: Relevant PMHx includes anemia, Thalassanemia minor, Heart murmur  Per referring provider "Her husband passed away on 03-12-2023.  She has been experiencing grief and poor sleep. She also has chronic weakness attributed to her anemia but felt worsening weakness in the legs. She has had tingling in her fingers and toes for about a year but started noticing involvement of her legs as well. No radicular pain in the legs. She feels off balance on her feet."   She recently starting taking metaprolol  PAIN:  Are you having pain? No   PRECAUTIONS: None  RED FLAGS: None   WEIGHT BEARING RESTRICTIONS: No  FALLS:  Has patient fallen in last 6 months? No  LIVING ENVIRONMENT: Lives with: lives alone Lives in: House/apartment Stairs: Yes, 1 flight with a railing  Has following equipment at home: None  OCCUPATION: retired; regular volunteering in the community   PLOF: Independent with household mobility without device and Independent with community mobility without device  PATIENT GOALS: Get back to more exercising and more walking   NEXT MD VISIT: 03/11/24 with referring provider   OBJECTIVE:  Note: Objective measures were completed at Evaluation unless otherwise noted.  DIAGNOSTIC FINDINGS:   10/23/23 MRI Lumbar Spine w/o contrast    IMPRESSION: 1. At L3-4 there is a moderate disc bulge. Moderate bilateral facet arthropathy. Moderate central canal stenosis. 2. At L4-5 there is a mild disc bulge. Moderate bilateral facet arthropathy with ligamentum flavum thickening. Moderate central canal stenosis and bilateral lateral recess stenosis. 3. At L2-3 there is a mild disc bulge. Moderate bilateral facet arthropathy. Mild bilateral foraminal narrowing. 4. No acute osseous injury of the lumbar spine.  PATIENT SURVEYS:  FOTO 63 current, 72 predicted  COGNITION: Overall cognitive status: Within functional limits for tasks assessed     POSTURE: flexed trunk  with prolonged sitting and with ambulation    LOWER EXTREMITY MMT:  MMT Right eval Left eval Right 01/11/24 Left 01/11/24  Hip flexion 4 4+ 4+ 4+  Hip extension      Hip abduction 4+ 4+ 5 5  Hip adduction 4+ 4+ 5 5  Hip internal rotation 4- 3+ 4 4  Hip external rotation 4 4- 4 4  Knee flexion 5 5 5 5   Knee extension 5 5 5 5   Ankle dorsiflexion      Ankle plantarflexion      Ankle inversion      Ankle eversion       (Blank rows = not tested)   FUNCTIONAL TESTS:  5 times sit to stand: 17 sec  2 minute walk test: 400 ft on 12/13/23  MCTSIB: Condition 1: Avg of 3 trials: 30 sec, Condition 2: Avg of 3 trials: 30 sec, Condition 3: Avg of 3 trials: 24 sec, Condition 4: Avg of 3 trials: 30 sec, and Total Score: 114/120  12/13/2023:  Tandem (even surface) 30 sec BIL  Tandem (uneven surface/foam) 14 sec L, 30 sec R  SLS (trial 1) R 12 sec, 7 sec   SLS (trail 2) L 7 sec, 4 sec    GAIT: Distance walked: 400 ft during , 12/13/2023 Assistive device utilized: None Comments: no significant gait deviations noted today                                                                                                                                 TREATMENT DATE:   [facility] Adult PT Treatment:  DATE:  01/11/2024   Therapeutic Activity:  Reassessment of objective measures and subjective assessment regarding progress towards established goals and plan for independence with prescribed/updated home program following discharged from PT   PATIENT EDUCATION:  Education details: reviewed initial home exercise program; discussion of POC, prognosis and goals for skilled PT   Person educated: Patient Education method: Explanation, Demonstration, and Handouts Education comprehension: verbalized understanding, returned demonstration, and needs further education  HOME EXERCISE PROGRAM: Access Code: [facility] URL: [email or web address] Date: 01/11/2024 Prepared by: Mauri Reading  Exercises - Sit to Stand Without Arm Support  - 1 x daily - 3-4 x weekly - 1 sets - 10 reps - Standing Hip Abduction with Counter Support  - 1 x daily - 3-4 x weekly - 1 sets - 10 reps - Standing Hip Extension with Counter Support  - 1 x daily - 3-4 x weekly - 1 sets - 10 reps - Standing Hip Flexion with Counter Support  - 1 x daily - 3-4 x weekly - 1 sets - 10 reps - Standing March with Counter Support  - 1 x daily - 3-4 x weekly - 1 sets - 10 reps - Tandem Stance in Corner  - 1 x daily - 3-4 x weekly - 2 sets - 20-30 sec sec hold - Hooklying Isometric Clamshell  - 1 x daily - 7 x weekly - 2 sets - 10 reps - Sidelying Reverse Clamshell  - 1 x daily - 7 x weekly - 2 sets - 10 reps  ASSESSMENT:  CLINICAL IMPRESSION: Patient has attended 5 visits since initial evaluation. Despite recent 3 week break in skilled PT services d/t weather, she has continued to be compliant with prescribed HEP. She has met all established rehab goals, with exception of hip external and internal rotation MMT bilaterally. She was provided with updated HEP and patient education for participation in regular physical activity for overall health. She is in agreement with the plan for discharge at this time, and plans to go on walks more  regularly.      OBJECTIVE IMPAIRMENTS: cardiopulmonary status limiting activity, decreased activity tolerance, difficulty walking, decreased ROM, decreased strength, and postural dysfunction.   ACTIVITY LIMITATIONS: carrying, lifting, bending, standing, squatting, stairs, transfers, and locomotion level  PARTICIPATION LIMITATIONS: meal prep, cleaning, laundry, shopping, and community activity  PERSONAL FACTORS: Age, Past/current experiences, Time since onset of injury/illness/exacerbation, and 1-2 comorbidities: Anemia, Thalassanemia minor  are also affecting patient's functional outcome.   REHAB POTENTIAL: Fair    CLINICAL DECISION MAKING: Stable/uncomplicated  EVALUATION COMPLEXITY: Low   GOALS: Goals reviewed with patient? Yes  SHORT TERM GOALS: Target date: 12/21/2023  Patient will be independent with initial home program for LE strengthening and standing tolerance.  Baseline: written HEP to be provided at f/u visit  Goal status: MET 01/11/24   LONG TERM GOALS: Target date: 01/11/2024   Patient will report improved overall functional ability with FOTO score of 70 or greater    Baseline: 63 01/11/24: 76 Goal status: MET 01/11/24  2.  Patient will demonstrate at least 4+/5 MMT throughout BIL LE strength testing  Baseline: see objective measures Goal status: MET 01/11/24 with exception of 4/5 MMT with hip IR/ER bilaterally   3.  Patient will decrease 5x STS test to 15 seconds or less without using arm rests.   Baseline: 17 seconds using arm rest  01/11/24: 9 seconds  Goal status: MET 01/11/24  4. Patient will demonstrate ability to tolerate at least 20 minutes of standing  and walking tasks prior to requiring seated rest break.   Baseline: unable to tolerate >5 minutes   Goal status:MET 01/11/24    PLAN:  PT FREQUENCY: 1-2x/week  PT DURATION: 8 weeks  PLANNED INTERVENTIONS: 97164- PT Re-evaluation, 97110-Therapeutic exercises, 97530- Therapeutic activity, 97112-  Neuromuscular re-education, 97535- Self Care, 21308- Manual therapy, 97014- Electrical stimulation (unattended), [id number]- Electrical stimulation (manual), [id number]- Traction (mechanical), Patient/Family education, Taping, Dry Needling, Spinal mobilization, Cryotherapy, and Moist heat  PLAN FOR NEXT SESSION: discharged from PT; return to care of referring provider   Mauri Reading, PT, DPT  01/11/2024 2:36 PM

## 2024-01-19 ENCOUNTER — Ambulatory Visit: Payer: Medicare HMO | Admitting: Internal Medicine

## 2024-01-24 ENCOUNTER — Inpatient Hospital Stay: Payer: Medicare HMO | Admitting: Internal Medicine

## 2024-01-24 ENCOUNTER — Inpatient Hospital Stay: Payer: Medicare HMO | Attending: Internal Medicine

## 2024-01-24 VITALS — BP 126/62 | HR 60 | Temp 97.6°F | Resp 16 | Ht 61.0 in | Wt 134.2 lb

## 2024-01-24 DIAGNOSIS — D563 Thalassemia minor: Secondary | ICD-10-CM

## 2024-01-24 DIAGNOSIS — Z79899 Other long term (current) drug therapy: Secondary | ICD-10-CM | POA: Insufficient documentation

## 2024-01-24 DIAGNOSIS — E785 Hyperlipidemia, unspecified: Secondary | ICD-10-CM | POA: Diagnosis not present

## 2024-01-24 DIAGNOSIS — I10 Essential (primary) hypertension: Secondary | ICD-10-CM | POA: Insufficient documentation

## 2024-01-24 DIAGNOSIS — D539 Nutritional anemia, unspecified: Secondary | ICD-10-CM

## 2024-01-24 LAB — CBC WITH DIFFERENTIAL (CANCER CENTER ONLY)
Abs Immature Granulocytes: 0.05 10*3/uL (ref 0.00–0.07)
Basophils Absolute: 0 10*3/uL (ref 0.0–0.1)
Basophils Relative: 0 %
Eosinophils Absolute: 0.2 10*3/uL (ref 0.0–0.5)
Eosinophils Relative: 2 %
HCT: 31.2 % — ABNORMAL LOW (ref 36.0–46.0)
Hemoglobin: 9.5 g/dL — ABNORMAL LOW (ref 12.0–15.0)
Immature Granulocytes: 1 %
Lymphocytes Relative: 14 %
Lymphs Abs: 1.3 10*3/uL (ref 0.7–4.0)
MCH: 17 pg — ABNORMAL LOW (ref 26.0–34.0)
MCHC: 30.4 g/dL (ref 30.0–36.0)
MCV: 55.9 fL — ABNORMAL LOW (ref 80.0–100.0)
Monocytes Absolute: 0.4 10*3/uL (ref 0.1–1.0)
Monocytes Relative: 5 %
Neutro Abs: 7.1 10*3/uL (ref 1.7–7.7)
Neutrophils Relative %: 78 %
Platelet Count: 218 10*3/uL (ref 150–400)
RBC: 5.58 MIL/uL — ABNORMAL HIGH (ref 3.87–5.11)
RDW: 18.5 % — ABNORMAL HIGH (ref 11.5–15.5)
WBC Count: 9 10*3/uL (ref 4.0–10.5)
nRBC: 0.4 % — ABNORMAL HIGH (ref 0.0–0.2)

## 2024-01-24 LAB — IRON AND IRON BINDING CAPACITY (CC-WL,HP ONLY)
Iron: 53 ug/dL (ref 28–170)
Saturation Ratios: 15 % (ref 10.4–31.8)
TIBC: 346 ug/dL (ref 250–450)
UIBC: 293 ug/dL (ref 148–442)

## 2024-01-24 LAB — FERRITIN: Ferritin: 540 ng/mL — ABNORMAL HIGH (ref 11–307)

## 2024-01-24 LAB — VITAMIN B12: Vitamin B-12: 485 pg/mL (ref 180–914)

## 2024-01-24 LAB — FOLATE: Folate: 14.1 ng/mL (ref >5.9)

## 2024-01-24 NOTE — Progress Notes (Signed)
 [facility] [phone]   [phone]  OFFICE PROGRESS NOTE  Lorayne Bender, MD [address] [place] [address]  DIAGNOSIS: Thalassemia minor  PRIOR THERAPY: None  CURRENT THERAPY: Observation  INTERVAL HISTORY: Hannah Landry 78 y.o. female returns to the clinic today for 36-month follow-up visit.Discussed the use of AI scribe software for clinical note transcription with the patient, who gave verbal consent to proceed.  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 Her current hemoglobin is 9.5 g/dL, and her MCV is 57.8 fL, consistent with thalassemia. No significant changes in her condition and no blood in her stool.  She has a past history of iron deficiency, but her iron levels have been stable recently. She is not currently taking iron supplements but continues to take folic acid 400 mcg, a multivitamin, garlic, and calcium supplements.  Since her last visit, she has been started on new medications, including amlodipine 5 mg for high blood pressure and Crestor 10 mg for cholesterol management, both at low doses.  Her last colonoscopy was five years ago. No gastrointestinal symptoms such as blood in her stool.       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  MEDICATIONS:  Current Outpatient Medications  Medication Sig Dispense Refill   acetaminophen (TYLENOL) 325 MG tablet Take 650 mg by mouth every 6 (six) hours as needed.     ALPHAGAN P 0.15 % ophthalmic solution      amLODipine (NORVASC) 5  MG tablet Take 1 tablet (5 mg total) by mouth daily. 180 tablet 3   Calcium Carbonate-Vit D-Min (CALTRATE 600+D PLUS MINERALS) 600-800 MG-UNIT TABS Take by mouth.     Dorzolamide HCl-Timolol Mal PF 2-0.5 % SOLN Apply 1 drop to eye 2 (two) times daily.     fluticasone (CUTIVATE) 0.05 % cream Apply topically 2 (two) times daily. (Patient taking differently: Apply topically as needed.) 30 g 2   folic acid (FOLVITE) 400 MCG tablet Take 400 mcg by mouth daily.       Garlic 1000 MG CAPS Take by mouth.     latanoprost (XALATAN) 0.005 % ophthalmic solution SMARTSIG:In Eye(s)     metoprolol tartrate (LOPRESSOR) 25 MG tablet Take 1 tablet (25 mg total) by mouth 2 (two) times daily. 180 tablet 3   Multiple Vitamins-Minerals (CENTRUM SILVER ADULT 50+) TABS Take 1 tablet by mouth daily.     rosuvastatin (CRESTOR) 10 MG tablet Take 1 tablet (10 mg total) by mouth daily. 90 tablet 3   valACYclovir (VALTREX) 1000 MG tablet Take 2 tabs now and repeat in 12 hours 12 tablet 2   No current facility-administered medications for this visit.    SURGICAL HISTORY:  Past Surgical History:  Procedure Laterality Date   ABDOMINAL HYSTERECTOMY     CESAREAN SECTION     COLONOSCOPY      REVIEW OF SYSTEMS:  A comprehensive review of systems was negative except for: Constitutional: positive for fatigue Neurological: positive for paresthesia  PHYSICAL EXAMINATION: General appearance: alert, cooperative, fatigued, and no distress Head: Normocephalic, without obvious abnormality, atraumatic Neck: no adenopathy, no JVD, supple, symmetrical, trachea midline, and thyroid not enlarged, symmetric, no tenderness/mass/nodules Lymph nodes: Cervical, supraclavicular, and axillary nodes normal. Resp: clear to auscultation bilaterally Back: symmetric, no curvature. ROM normal. No CVA tenderness. Cardio: regular rate and rhythm, S1, S2 normal, no murmur, click, rub or gallop GI: soft, non-tender; bowel sounds normal; no masses,   no organomegaly Extremities: extremities normal, atraumatic, no cyanosis or edema  ECOG PERFORMANCE STATUS: 1 - Symptomatic but completely ambulatory  Blood pressure 126/62, pulse 60, temperature 97.6 F (36.4 C), temperature source Temporal, resp. rate 16, height 5\' 1"  (1.549 m), weight 134 lb 3.2 oz (60.9 kg), SpO2 100%.  LABORATORY DATA: Lab Results  Component Value Date   WBC 8.7 07/27/2023   HGB 10.3 (L) 07/27/2023   HCT 33.1 (L) 07/27/2023   MCV 57.4 (L) 07/27/2023   PLT 254 07/27/2023      Chemistry      Component Value Date/Time   NA 144 06/10/2023 2057   NA 141 10/04/2022 0951   K 3.5 06/10/2023 2057   CL 101 06/10/2023 2057   CO2 28 06/10/2023 2057   BUN 10 06/10/2023 2057   BUN 14 10/04/2022 0951   CREATININE 0.96 06/10/2023 2057   CREATININE 0.85 07/29/2021 0948   CREATININE 0.81 12/02/2013 1034      Component Value Date/Time   CALCIUM 10.0 06/10/2023 2057   ALKPHOS 51 04/08/2023 1005   AST 16 04/08/2023 1005   AST 14 (L) 07/29/2021 0948   ALT 13 04/08/2023 1005   ALT 12 07/29/2021 0948   BILITOT 1.8 (H) 04/08/2023 1005   BILITOT 1.3 (H) 10/04/2022 0951   BILITOT 1.8 (H) 07/29/2021 0948       RADIOGRAPHIC STUDIES: No results found.  ASSESSMENT AND PLAN: This is a very pleasant 78 years old African-American female with history of thalassemia minor as well as iron overload.  She has a history of adenomatous polyp in the past  She is currently on observation with vitamin B supplement.      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 Hemoglobin levels remain consistent without significant drop. - Continue current management and monitoring. - Repeat blood work in six months to monitor hemoglobin levels.  Hypertension Currently on low-dose amlodipine 5 mg for hypertension. Blood pressure management is ongoing.  Hyperlipidemia Currently on  low-dose Crestor 10 mg for hyperlipidemia. Lipid management is ongoing.  Iron deficiency (resolved) Iron deficiency resolved with current iron levels adequate. No iron supplementation needed.  Follow-up Regular follow-up is necessary to monitor anemia and overall health status. - Schedule follow-up appointment in six months. - Repeat blood work at the time of follow-up.   The patient was advised to call immediately if she has any concerning symptoms in the interval.   The patient voices understanding of current disease status and treatment options and is in agreement with the current care plan.  All questions were answered. The patient knows to call the clinic with any problems, questions or concerns. We can certainly see the patient much sooner if necessary.   Disclaimer: This note was dictated with voice recognition software. Similar sounding words can inadvertently be transcribed and may not be corrected upon review.

## 2024-02-08 ENCOUNTER — Encounter: Payer: Self-pay | Admitting: Dermatology

## 2024-02-08 ENCOUNTER — Ambulatory Visit: Payer: Medicare HMO | Admitting: Dermatology

## 2024-02-08 VITALS — BP 116/74

## 2024-02-08 DIAGNOSIS — L819 Disorder of pigmentation, unspecified: Secondary | ICD-10-CM | POA: Diagnosis not present

## 2024-02-08 DIAGNOSIS — L814 Other melanin hyperpigmentation: Secondary | ICD-10-CM

## 2024-02-08 DIAGNOSIS — L608 Other nail disorders: Secondary | ICD-10-CM | POA: Diagnosis not present

## 2024-02-08 NOTE — Progress Notes (Signed)
   Follow-Up Visit   Subjective  Athalia Setterlund is a 78 y.o. female who presents for the following: melanonychia  Patient present today for follow up visit. Patient was last evaluated on 06/06/23. At this visit patient was prescribed n/a. Patient reports sxs are unchanged. Patient denies medication changes.  The following portions of the chart were reviewed this encounter and updated as appropriate: medications, allergies, medical history  Review of Systems:  No other skin or systemic complaints except as noted in HPI or Assessment and Plan.  Objective  Well appearing patient in no apparent distress; mood and affect are within normal limits.   A focused examination was performed of the following areas: Bilateral index fingers    Relevant exam findings are noted in the Assessment and Plan.     Left index      Right index         Assessment & Plan    1. Melanonychia - Assessment: Affected nail appears unchanged from the previous visit 8 months ago. Measurements of the affected area remain consistent with prior documentation. - Plan:    Obtain updated photographs of affected nail for documentation    Annual follow-up if condition remains stable  2. Hyperpigmentation - Assessment: Patient reports hyperpigmented patches on nose, likely due to friction from eyeglasses. - Plan:    Prescribe Excedrin Radiant Tone cream for hyperpigmentation    Instruct patient to apply cream twice daily to affected areas and entire face    Patient education: expect results in 4-8 months, take progress photos    Obtain photographs of hyperpigmented areas for documentation    Recommend daily use of moisturizer with sunscreen, especially during summer    Follow-up in one year   No follow-ups on file.    Documentation: I have reviewed the above documentation for accuracy and completeness, and I agree with the above.  I, Shirron Marcha Solders, CMA, am acting as scribe for [employer], DO.    Langston Reusing, DO

## 2024-02-08 NOTE — Patient Instructions (Addendum)
 Hello Hannah Landry,  Thank you for visiting today. Here is a summary of the key instructions:  - Medications: Use Eucerin Radiant Tone cream twice daily for hyperpigmentation   - Apply in the morning and at night after washing face   - May take 4-8 months to see results   - Use on nose and other affected areas  - Skin Care:   - Use moisturizer with sunscreen daily, especially in summer   - Apply sunscreen after Excedrin Radiant Tone in the morning to protect skin  - Follow-up: Next appointment in 1 year  - Monitoring: Take pictures of hyperpigmented areas to track progress  We look forward to seeing the positive changes in your next visit. If you have any questions or concerns before then, please do not hesitate to contact our office.  Warm regards,  Dr. Langston Reusing, Dermatology        Important Information  Due to recent changes in healthcare laws, you may see results of your pathology and/or laboratory studies on MyChart before the doctors have had a chance to review them. We understand that in some cases there may be results that are confusing or concerning to you. Please understand that not all results are received at the same time and often the doctors may need to interpret multiple results in order to provide you with the best plan of care or course of treatment. Therefore, we ask that you please give [place] 2 business days to thoroughly review all your results before contacting the office for clarification. Should we see a critical lab result, you will be contacted sooner.   If You Need Anything After Your Visit  If you have any questions or concerns for your doctor, please call our main line at [phone] If no one answers, please leave a voicemail as directed and we will return your call as soon as possible. Messages left after 4 pm will be answered the following business day.   You may also send [place] a message via MyChart. We typically respond to MyChart messages within 1-2  business days.  For prescription refills, please ask your pharmacy to contact our office. Our fax number is [phone].  If you have an urgent issue when the clinic is closed that cannot wait until the next business day, you can page your doctor at the number below.    Please note that while we do our best to be available for urgent issues outside of office hours, we are not available 24/7.   If you have an urgent issue and are unable to reach [place], you may choose to seek medical care at your doctor's office, retail clinic, urgent care center, or emergency room.  If you have a medical emergency, please immediately call 911 or go to the emergency department. In the event of inclement weather, please call our main line at [phone] for an update on the status of any delays or closures.  Dermatology Medication Tips: Please keep the boxes that topical medications come in in order to help keep track of the instructions about where and how to use these. Pharmacies typically print the medication instructions only on the boxes and not directly on the medication tubes.   If your medication is too expensive, please contact our office at [phone] or send [place] a message through MyChart.   We are unable to tell what your co-pay for medications will be in advance as this is different depending on your insurance coverage. However, we may be able  to find a substitute medication at lower cost or fill out paperwork to get insurance to cover a needed medication.   If a prior authorization is required to get your medication covered by your insurance company, please allow [place] 1-2 business days to complete this process.  Drug prices often vary depending on where the prescription is filled and some pharmacies may offer cheaper prices.  The website www.goodrx.com contains coupons for medications through different pharmacies. The prices here do not account for what the cost may be with help from insurance (it may  be cheaper with your insurance), but the website can give you the price if you did not use any insurance.  - You can print the associated coupon and take it with your prescription to the pharmacy.  - You may also stop by our office during regular business hours and pick up a GoodRx coupon card.  - If you need your prescription sent electronically to a different pharmacy, notify our office through [facility] or by phone at [phone]

## 2024-02-14 ENCOUNTER — Telehealth: Payer: Self-pay | Admitting: Cardiovascular Disease

## 2024-02-14 NOTE — Telephone Encounter (Signed)
 Called and spoke with patient regarding vibrating sensation she has been having for last couple of weeks.  Vibrating mostly in lower part of body and stated could even be related to her neuropathy  Denied any pain or pounding/racing of heart beat  Concerned may be coming from a medications  Explained this was not something had heard of with her cardiology medications and recommended calling her PCP  Patient would like to know Dr Leonides Sake recommendations.  Discussed with Eula Fried NP in office who agreed with recommendations  Advised would forward to her for review when she returns to office

## 2024-02-14 NOTE — Telephone Encounter (Signed)
 Pt would like a c/b from the nurse due to her feeling like her body is vibrating and she's not sure if it has something that has to do with her heart. Please advise

## 2024-02-22 ENCOUNTER — Ambulatory Visit (INDEPENDENT_AMBULATORY_CARE_PROVIDER_SITE_OTHER): Admitting: Family Medicine

## 2024-02-22 VITALS — BP 136/66 | HR 59 | Wt 134.8 lb

## 2024-02-22 DIAGNOSIS — G5793 Unspecified mononeuropathy of bilateral lower limbs: Secondary | ICD-10-CM

## 2024-02-22 NOTE — Patient Instructions (Addendum)
 It was wonderful to see you today.  Please bring ALL of your medications with you to every visit.   Today we talked about:  - I am going to check some lab work today. I recommend you reach out to your neurologist to see if he can see you sooner about your new symptoms.  I will message or call you with lab results.  Thank you for choosing [facility].   Please call [phone] with any questions about today's appointment.  Please arrive at least 15 minutes prior to your scheduled appointments.   If you had blood work today, I will send you a MyChart message or a letter if results are normal. Otherwise, I will give you a call.   If you had a referral placed, they will call you to set up an appointment. Please give [place] a call if you don't hear back in the next 2 weeks.   If you need additional refills before your next appointment, please call your pharmacy first.   Burley Saver, MD  Family Medicine

## 2024-02-22 NOTE — Progress Notes (Addendum)
    SUBJECTIVE:   CHIEF COMPLAINT / HPI:   Vibrations in her body- mostly in her legs, from the hips down. Been going on for greater than a week. Notes sometimes it feels like a shaking or pounding, not present all the time, mostly notices it when she sits and rests. No falls, no weakness, no tingling, no numbness in legs. No bowel or bladder incontinence. Has previous had neuropathy and sees neurology Dr Everlena Cooper, had normal nerve conduction study, MRI L-spine that showed mild disc bulging, for which she did PT and felt the exercises helped, but this vibrating is new. Denies leg pain or cramping. Denies chest pain, shortness of breath, heart racing, or vibration senses in upper extremities or elsewhere. Nothing seems to make it worse or better. Happened occassionally in the past, but now happens every day for past few weeks, it isn't constant and will come and go. No pounding or ringing in her ears.  HLD- started on rosuvastatin by cardiology in Feb, repeat in three months  PERTINENT  PMH / PSH: HTN, HLD,non-obstructive CAD, thalassemia minor, insomnia  OBJECTIVE:   BP 136/66   Pulse (!) 59   Wt 134 lb 12.8 oz (61.1 kg)   SpO2 100%   BMI 25.47 kg/m   General: A&O, NAD HEENT: No sign of trauma, EOM grossly intact Cardiac: RRR, no m/r/g Respiratory: CTAB, normal WOB, no w/c/r GI: Soft, NTTP, non-distended  Extremities: NTTP, no peripheral edema. No rashes. 2+ dorsalis pedis and posterior tibial pulses bilaterally. No skin breakdown or ulcers presented. NO tremors or fasciculations noted. Neuro: Normal gait, moves all four extremities appropriately. Normal sensation in bilateral lower extremities, normal proprioception of big toes bilaterally. Strength 5/5 in bilateral LE, 2+ patellar reflexes bilaterally. Psych: Appropriate mood and affect   ASSESSMENT/PLAN:   Assessment & Plan Neuropathy involving both lower extremities ? If vibrations related to neuropathy versus restless leg syndrome,  previous extensive work up including EMG and MRI L spine with her neurologist. Will check ferritin, BMP, CPK today as she did recently start rosuvastatin in Feb 2025 but doesn't describe the typical muscle cramping or aching. Also recommended reaching back out to her neurologist. Did discuss strict return precautions including weakness, numbness, bowel/bladder incontinence.   Called pt back after visit as she was asking the front for me to call her- she notes sometimes after working in the garden she will have some pain in her left leg only that runs down the leg, gets better with Tylenol. NO numbness or weakness and she did not have it today which is why she did not mention it in our visit. I suggested that Tylenol and capsaicin cream would be fine and if this starts happening more regularly or the pain stays or any numbness or weakness we would want to see her back about this.   Billey Co, MD [facility]

## 2024-02-23 ENCOUNTER — Encounter: Payer: Self-pay | Admitting: Family Medicine

## 2024-02-23 LAB — BASIC METABOLIC PANEL WITH GFR
BUN/Creatinine Ratio: 12 (ref 12–28)
BUN: 11 mg/dL (ref 8–27)
CO2: 24 mmol/L (ref 20–29)
Calcium: 9.8 mg/dL (ref 8.7–10.3)
Chloride: 105 mmol/L (ref 96–106)
Creatinine, Ser: 0.95 mg/dL (ref 0.57–1.00)
Glucose: 97 mg/dL (ref 70–99)
Potassium: 4.8 mmol/L (ref 3.5–5.2)
Sodium: 143 mmol/L (ref 134–144)
eGFR: 61 mL/min/{1.73_m2} (ref >59)

## 2024-02-23 LAB — CK: Total CK: 66 U/L (ref 32–182)

## 2024-02-23 LAB — FERRITIN: Ferritin: 816 ng/mL — ABNORMAL HIGH (ref 15–150)

## 2024-03-08 ENCOUNTER — Encounter: Payer: Self-pay | Admitting: Family Medicine

## 2024-03-08 ENCOUNTER — Telehealth: Payer: Self-pay | Admitting: Neurology

## 2024-03-08 NOTE — Telephone Encounter (Signed)
 LMOVM please give the office a call or send us  a mychart  with her concerns are.

## 2024-03-08 NOTE — Telephone Encounter (Signed)
 Patient called to see if she could see Dr.Jaffe sooner than her 04/10/24 appt. Her PCP told her she needs to see him before that due to some issues. Notes are in the chart

## 2024-03-11 ENCOUNTER — Ambulatory Visit: Payer: Medicare HMO | Admitting: Neurology

## 2024-04-09 NOTE — Progress Notes (Unsigned)
 NEUROLOGY FOLLOW UP OFFICE NOTE  Hannah Landry [id number]  Assessment/Plan:   Lumbar spinal stenosis Lower extremity discomfort not consistent with restless leg.  May be related to the lumbar stenosis but notes generalized vibration sensation in the body as well   Follow up 6 months for monitoring/recheck  Total time spent in chart and face to face with patient:  21 minutes   Subjective:  Hannah Landry is a 78 year old female with thalassanemia minor and anemia who follows up for headaches and neuropathy.  MRI lumbar spine personally reviewed.  UPDATE: Due to ongoing paresthesias and lower extremity weakness, underwent NCV-EMG of lower extremities on 09/28/2023 which was normal.  MRI of lumbar spine on 10/23/2023 revealed multilevel degenerative changes with mild to moderate disc bulges and moderate bilateral facet arthropathy at L2-3, L3-4 and L4-5 as well as moderate central canal stenosis at L3-4 and L4-5.  Referred to physical therapy.  It was helpful and still does home exercises.  Weakness overall improved.  Tingling in both legs improved.  No longer feels tingling but sometimes feels vibration sensation in her body, more prominent in the legs.  It is not a creepy crawly feeling that is uncomfortable and causes her to move her legs for relief.  Labs from 01/24/2024 revealed B12 485 and labs from 02/22/2024 revealed CK 66 and ferritin 816.     HISTORY: Headache: Patient has had headaches off and since 05/23/2022.  Her husband passed away on Apr 10, 2023.  She has been experiencing grief and poor sleep.  She started having worsening headaches.  She presented to the ED on 5/25.for a particularly severe headache but also for new left arm heaviness and chest pressure and back pain.  EKG and labs were negative for acute coronary event.  CTA chest negative for PE.  CT head negative for acute intracranial abnormalities.  MRI of brain without contrast was also unremarkable, revealing age-related mild  generalized cerebral atrophy and few punctate T2/FLAIR hyperintense foci in the cerebral white matter.  She was given ASA for chest pressure and Reglan  and Benadryl  for headache.  She continues to have daily headaches but no longer severe.  They occur after getting up in the morning. She takes Tylenol daily, which helps.  It is a throbbing pain at her crown or top of the head.  No associated nausea, vomiting, photophobia, phonophobia, visual disturbance.  She was started on metoprolol  for high blood pressure.  Once her blood pressure was controlled, headaches improved.    Paresthesias and bilateral lower extremity weakness: She also has chronic weakness attributed to her anemia but felt worsening weakness in the legs.  She has had tingling in her fingers and toes since about 05-23-22 but started noticing involvement of her legs as well.  Endorses an intermittent pins and needles and tightness sensation in the legs below the knees but not really in the feet.  Does not have the feeling when she is lying down.  Legs feel tired but no actual pain.  She feels off balance on her feet.  No back pain.  She takes oral B12 supplement.  Labs for causes of neuropathy unremarkable, including negative ANA, negative ENA panel (with SSA/SSB antibodies), negative IFE, ACE 29, B12 1156, TSH 0.69, normal copper , normal heavy metal screen, normal CK.  She has been experiencing fluid retention in the ankles.  Vascular ABI in August was negative.  No back pain.      Past medications:  gabapentin , duloxetine   PAST MEDICAL HISTORY:  Past Medical History:  Diagnosis Date   Anemia    CAD in native artery 01/09/2024   Heart murmur    Hx of adenomatous colonic polyps 09/18/2017   Primary hypertension 09/22/2021   Primary hypertension 09/22/2021   Thalassanemia     MEDICATIONS: Current Outpatient Medications on File Prior to Visit  Medication Sig Dispense Refill   acetaminophen (TYLENOL) 325 MG tablet Take 650 mg by mouth  every 6 (six) hours as needed.     ALPHAGAN P 0.15 % ophthalmic solution      amLODipine  (NORVASC ) 5 MG tablet Take 1 tablet (5 mg total) by mouth daily. 180 tablet 3   Calcium  Carbonate-Vit D-Min (CALTRATE 600+D PLUS MINERALS) 600-800 MG-UNIT TABS Take by mouth.     Dorzolamide HCl-Timolol Mal PF 2-0.5 % SOLN Apply 1 drop to eye 2 (two) times daily.     fluticasone  (CUTIVATE ) 0.05 % cream Apply topically 2 (two) times daily. (Patient taking differently: Apply topically as needed.) 30 g 2   folic acid  (FOLVITE ) 400 MCG tablet Take 400 mcg by mouth daily.       Garlic 1000 MG CAPS Take by mouth.     latanoprost (XALATAN) 0.005 % ophthalmic solution SMARTSIG:In Eye(s)     metoprolol  tartrate (LOPRESSOR ) 25 MG tablet Take 1 tablet (25 mg total) by mouth 2 (two) times daily. 180 tablet 3   Multiple Vitamins-Minerals (CENTRUM SILVER ADULT 50+) TABS Take 1 tablet by mouth daily.     rosuvastatin  (CRESTOR ) 10 MG tablet Take 1 tablet (10 mg total) by mouth daily. 90 tablet 3   valACYclovir  (VALTREX ) 1000 MG tablet Take 2 tabs now and repeat in 12 hours 12 tablet 2   No current facility-administered medications on file prior to visit.    ALLERGIES: No Known Allergies  FAMILY HISTORY: Family History  Problem Relation Age of Onset   Colon polyps Mother    Hypertension Mother    Hypertension Father    Colon cancer Neg Hx    Esophageal cancer Neg Hx    Rectal cancer Neg Hx    Stomach cancer Neg Hx       Objective:  Blood pressure (!) 122/57, pulse 65, height 5' (1.524 m), weight 137 lb (62.1 kg), SpO2 98%. General: No acute distress.  Patient appears well-groomed.   Head:  Normocephalic/atraumatic Eyes:  Fundi examined but not visualized Heart:  Regular rate and rhythm Back: No paraspinal tenderness Neurological Exam: Alert and oriented.  Speech fluent and not dysarthric.  Language intact.  CN II-XII intact.  Bulk and tone normal.  Muscle strength 5/5 throughout.  Sensation to pinprick and  vibration intact.  DTRs 3+ in patellars, otherwise 2+, toes downgoing.  Finger to nose testing intact.  Gait normal.  Romberg negative.     Hannah Members, DO  CC: Hannah Austria, MD

## 2024-04-10 ENCOUNTER — Ambulatory Visit: Payer: Medicare HMO | Admitting: Neurology

## 2024-04-10 ENCOUNTER — Encounter: Payer: Self-pay | Admitting: Neurology

## 2024-04-10 VITALS — BP 122/57 | HR 65 | Ht 60.0 in | Wt 137.0 lb

## 2024-04-10 DIAGNOSIS — M48062 Spinal stenosis, lumbar region with neurogenic claudication: Secondary | ICD-10-CM

## 2024-04-10 NOTE — Patient Instructions (Signed)
 Monitor symptoms.

## 2024-04-20 ENCOUNTER — Ambulatory Visit: Payer: Self-pay | Admitting: Cardiovascular Disease

## 2024-04-20 LAB — COMPREHENSIVE METABOLIC PANEL WITH GFR
ALT: 14 IU/L (ref 0–32)
AST: 18 IU/L (ref 0–40)
Albumin: 4.9 g/dL — ABNORMAL HIGH (ref 3.8–4.8)
Alkaline Phosphatase: 96 IU/L (ref 44–121)
BUN/Creatinine Ratio: 13 (ref 12–28)
BUN: 14 mg/dL (ref 8–27)
Bilirubin Total: 1.6 mg/dL — ABNORMAL HIGH (ref 0.0–1.2)
CO2: 22 mmol/L (ref 20–29)
Calcium: 9.7 mg/dL (ref 8.7–10.3)
Chloride: 106 mmol/L (ref 96–106)
Creatinine, Ser: 1.04 mg/dL — ABNORMAL HIGH (ref 0.57–1.00)
Globulin, Total: 1.9 g/dL (ref 1.5–4.5)
Glucose: 96 mg/dL (ref 70–99)
Potassium: 4.9 mmol/L (ref 3.5–5.2)
Sodium: 144 mmol/L (ref 134–144)
Total Protein: 6.8 g/dL (ref 6.0–8.5)
eGFR: 55 mL/min/{1.73_m2} — ABNORMAL LOW (ref >59)

## 2024-04-20 LAB — LIPID PANEL
Chol/HDL Ratio: 1.9 ratio (ref 0.0–4.4)
Cholesterol, Total: 150 mg/dL (ref 100–199)
HDL: 79 mg/dL (ref >39)
LDL Chol Calc (NIH): 54 mg/dL (ref 0–99)
Triglycerides: 91 mg/dL (ref 0–149)
VLDL Cholesterol Cal: 17 mg/dL (ref 5–40)

## 2024-04-22 NOTE — Telephone Encounter (Signed)
 Please review and advise.

## 2024-06-07 ENCOUNTER — Encounter: Payer: Self-pay | Admitting: Family Medicine

## 2024-06-19 ENCOUNTER — Ambulatory Visit (INDEPENDENT_AMBULATORY_CARE_PROVIDER_SITE_OTHER): Admitting: Family Medicine

## 2024-06-19 ENCOUNTER — Encounter: Payer: Self-pay | Admitting: Family Medicine

## 2024-06-19 ENCOUNTER — Ambulatory Visit: Admitting: Family Medicine

## 2024-06-19 VITALS — BP 131/64 | HR 59 | Ht 61.0 in | Wt 133.2 lb

## 2024-06-19 DIAGNOSIS — R234 Changes in skin texture: Secondary | ICD-10-CM | POA: Diagnosis not present

## 2024-06-19 NOTE — Patient Instructions (Signed)
 Thank you for coming in today! Here is a summary of what we discussed:  -You can use lotion on your feet after you shower but hold off on excessive lotions or vaseline for now  -Give your feet some time to air out during the day  -Please follow up if things do not improve or if they get worse   Please call the clinic at [phone] if your symptoms worsen or you have any concerns.  Best, Dr Adele

## 2024-06-19 NOTE — Progress Notes (Signed)
    SUBJECTIVE:   CHIEF COMPLAINT / HPI:   Peeling both feet --started a few weeks --tried vaseline, socks with moisturizer --stopped wearing compression hose about 1 week ago, hasn't changed peeling. Has been wearing those for a year or so --no painful, no itching --no exposures --no sunburns --no changes in meds -- No new sexual partners, substance use  PERTINENT  PMH / PSH: HTN, CAD, HLD  OBJECTIVE:   BP 131/64   Pulse (!) 59   Ht 5' 1 (1.549 m)   Wt 133 lb 3.2 oz (60.4 kg)   SpO2 99%   BMI 25.17 kg/m   General: Awake and conversant, no acute distress Extremities: Bilateral feet with peeling skin, no odor, no discoloration, no wounds Respiratory: Normal work of breathing on room air Psych: Appropriate mood and affect    ASSESSMENT/PLAN:   Assessment & Plan Peeling skin No concern for fungal infection, dermatitis, syphilis at this time.  Possibly pitted keratolysis, although lack of malodor and maceration makes this less likely.  Also could be xerotic dermatitis but there is some evidence of odor moisturization on exam.  Advised less frequent moisturization and allowing feet to air out with open toed shoes or no shoes when able during the day.  Advised continuing to avoid compression hose until symptoms improve.  Reviewed return precautions.  Patient will schedule follow-up appointment as needed.     Rea Raring, MD [facility]

## 2024-06-20 ENCOUNTER — Ambulatory Visit: Admitting: Dermatology

## 2024-06-20 ENCOUNTER — Encounter: Payer: Self-pay | Admitting: Dermatology

## 2024-06-20 VITALS — BP 134/61

## 2024-06-20 DIAGNOSIS — B351 Tinea unguium: Secondary | ICD-10-CM

## 2024-06-20 DIAGNOSIS — B353 Tinea pedis: Secondary | ICD-10-CM

## 2024-06-20 MED ORDER — CICLOPIROX 8 % EX SOLN: Freq: Every day | CUTANEOUS | 5 refills | Status: AC

## 2024-06-20 NOTE — Progress Notes (Signed)
   Follow-Up Visit   Subjective  Hannah Landry is a 78 y.o. female established patient who presents for FOLLOW UP on the diagnoses listed below:  Patient was last evaluated on 02/08/24 but is here today for new issue.    Peeling Skin: Patient is experiencing B/L foot peeling that started 2 weeks ago and has not improved with at home care using Vaseline covered with socks at night. Feet are not painful rating it 0 out of 10.    The following portions of the chart were reviewed this encounter and updated as appropriate: medications, allergies, medical history  Review of Systems:  No other skin or systemic complaints except as noted in HPI or Assessment and Plan.  Objective  Well appearing patient in no apparent distress; mood and affect are within normal limits.   A focused examination was performed of the following areas: B/L feet   Relevant exam findings are noted in the Assessment and Plan.                       Assessment & Plan   1. Tinea Pedis - Assessment: Patient reports tightness in feet without pain or itching. She goes barefoot in the house and experiences foot sweating during tennis. Excessive peeling observed, consistent with tinea pedis. The fungal infection is present in the top layers of dead skin. - Plan:    Apply ketoconazole  cream daily to feet and between toes for 2 months    Use LaRoche-Posay Lipikar with urea as a moisturizer for exfoliation    Treat shoes with ZiaSorb AF antifungal powder once or twice weekly    Wash tennis shoes and apply antifungal powder    Provided patient education on treatment regimen and product use  2. Onychomycosis - Assessment: Fungal infection noted in one of the patient's toenails, consistent with onychomycosis. - Plan:    Apply Ciclopirox  Lacquer 8% to affected nail daily for 6 months    Remove lacquer with alcohol swab weekly and reapply    Continue treatment until new nail grows out    Provided patient education on  treatment regimen   No follow-ups on file.   Documentation: I have reviewed the above documentation for accuracy and completeness, and I agree with the above.  I, Shirron Maranda, CMA, am acting as scribe for [employer], DO.   Delon Lenis, DO

## 2024-06-20 NOTE — Patient Instructions (Addendum)
 Date: Thu Jun 20 2024  Hello Hannah Landry,  Thank you for visiting today. Here is a summary of the key instructions:  - Medications:   - Use ketoconazole cream daily on feet and between toes for 2 months   - Apply Ciclopirox  Lacquer 8% to affected nail daily for 6 months   - Remove lacquer with alcohol swab once a week and reapply  - Skin Care:   - Use LaRoche-Posay Lipikar with urea as a moisturizer on feet daily   - You can mix ketoconazole and LaRoche-Posay moisturizer if preferred  - Other Instructions:   - Treat shoes with ZeaSorb AF antifungal powder once or twice a week   - Wash tennis shoes and sprinkle with antifungal powder   - Continue foot treatment for 2 months   - Continue nail treatment for about 6 months until new nail grows out   - Therapist, occupational moisturizer at [employer] or [employer]   - Samples of LaRoche-Posay moisturizer provided at the office  Please reach out if you have any questions or concerns.  Warm regards,  Dr. Delon Lenis [facility]at[facility]      Important Information  Due to recent changes in healthcare laws, you may see results of your pathology and/or laboratory studies on MyChart before the doctors have had a chance to review them. We understand that in some cases there may be results that are confusing or concerning to you. Please understand that not all results are received at the same time and often the doctors may need to interpret multiple results in order to provide you with the best plan of care or course of treatment. Therefore, we ask that you please give us  2 business days to thoroughly review all your results before contacting the office for clarification. Should we see a critical lab result, you will be contacted sooner.   If You Need Anything After Your Visit  If you have any questions or concerns for your doctor, please call our main line at [phone] If no one answers, please leave a voicemail as directed and we will return  your call as soon as possible. Messages left after 4 pm will be answered the following business day.   You may also send us  a message via MyChart. We typically respond to MyChart messages within 1-2 business days.  For prescription refills, please ask your pharmacy to contact our office. Our fax number is [phone].  If you have an urgent issue when the clinic is closed that cannot wait until the next business day, you can page your doctor at the number below.    Please note that while we do our best to be available for urgent issues outside of office hours, we are not available 24/7.   If you have an urgent issue and are unable to reach us , you may choose to seek medical care at your doctor's office, retail clinic, urgent care center, or emergency room.  If you have a medical emergency, please immediately call 911 or go to the emergency department. In the event of inclement weather, please call our main line at [phone] for an update on the status of any delays or closures.  Dermatology Medication Tips: Please keep the boxes that topical medications come in in order to help keep track of the instructions about where and how to use these. Pharmacies typically print the medication instructions only on the boxes and not directly on the medication tubes.   If your medication is too expensive, please  contact our office at [phone] or send us  a message through MyChart.   We are unable to tell what your co-pay for medications will be in advance as this is different depending on your insurance coverage. However, we may be able to find a substitute medication at lower cost or fill out paperwork to get insurance to cover a needed medication.   If a prior authorization is required to get your medication covered by your insurance company, please allow us  1-2 business days to complete this process.  Drug prices often vary depending on where the prescription is filled and some pharmacies may  offer cheaper prices.  The website www.goodrx.com contains coupons for medications through different pharmacies. The prices here do not account for what the cost may be with help from insurance (it may be cheaper with your insurance), but the website can give you the price if you did not use any insurance.  - You can print the associated coupon and take it with your prescription to the pharmacy.  - You may also stop by our office during regular business hours and pick up a GoodRx coupon card.  - If you need your prescription sent electronically to a different pharmacy, notify our office through [facility] or by phone at [phone]

## 2024-06-21 ENCOUNTER — Encounter: Payer: Self-pay | Admitting: Dermatology

## 2024-06-24 ENCOUNTER — Other Ambulatory Visit: Payer: Self-pay

## 2024-06-24 DIAGNOSIS — B353 Tinea pedis: Secondary | ICD-10-CM

## 2024-06-24 MED ORDER — KETOCONAZOLE 2 % EX CREA: TOPICAL_CREAM | CUTANEOUS | 3 refills | Status: DC

## 2024-06-24 MED ORDER — KETOCONAZOLE 2 % EX CREA: TOPICAL_CREAM | CUTANEOUS | 2 refills | Status: DC

## 2024-06-25 ENCOUNTER — Other Ambulatory Visit: Payer: Self-pay

## 2024-06-25 DIAGNOSIS — B353 Tinea pedis: Secondary | ICD-10-CM

## 2024-06-25 MED ORDER — KETOCONAZOLE 2 % EX CREA: TOPICAL_CREAM | CUTANEOUS | 3 refills | Status: AC

## 2024-06-25 NOTE — Progress Notes (Signed)
 Pharmacy needed to know how many grams to process

## 2024-07-04 ENCOUNTER — Ambulatory Visit (INDEPENDENT_AMBULATORY_CARE_PROVIDER_SITE_OTHER)

## 2024-07-04 VITALS — BP 134/61 | Ht 61.0 in | Wt 132.0 lb

## 2024-07-04 DIAGNOSIS — Z0000 Encounter for general adult medical examination without abnormal findings: Secondary | ICD-10-CM | POA: Diagnosis not present

## 2024-07-04 NOTE — Patient Instructions (Signed)
 Ms. Hannah Landry , Thank you for taking time out of your busy schedule to complete your Annual Wellness Visit with me. I enjoyed our conversation and look forward to speaking with you again next year. I, as well as your care team,  appreciate your ongoing commitment to your health goals. Please review the following plan we discussed and let me know if I can assist you in the future. Your Game plan/ To Do List    Referrals: If you haven't heard from the office you've been referred to, please reach out to them at the phone provided.   Follow up Visits: We will see or speak with you next year for your Next Medicare AWV with our clinical staff Have you seen your provider in the last 6 months (3 months if uncontrolled diabetes)? Yes  Clinician Recommendations:  Aim for 30 minutes of exercise or brisk walking, 6-8 glasses of water, and 5 servings of fruits and vegetables each day.       This is a list of the screenings recommended for you:  Health Maintenance  Topic Date Due   COVID-19 Vaccine (10 - 2024-25 season) 03/14/2024   DTaP/Tdap/Td vaccine (3 - Td or Tdap) 03/18/2024   Medicare Annual Wellness Visit  06/29/2024   Flu Shot  06/14/2024   Pneumococcal Vaccine for age over 72  Completed   DEXA scan (bone density measurement)  Completed   Hepatitis C Screening  Completed   Zoster (Shingles) Vaccine  Completed   HPV Vaccine  Aged Out   Meningitis B Vaccine  Aged Out   Colon Cancer Screening  Discontinued    Advanced directives: (In Chart) A copy of your advanced directives are scanned into your chart should your provider ever need it. Advance Care Planning is important because it:  [x]  Makes sure you receive the medical care that is consistent with your values, goals, and preferences  [x]  It provides guidance to your family and loved ones and reduces their decisional burden about whether or not they are making the right decisions based on your wishes.  Follow the link provided in your after  visit summary or read over the paperwork we have mailed to you to help you started getting your Advance Directives in place. If you need assistance in completing these, please reach out to us  so that we can help you!  See attachments for Preventive Care and Fall Prevention Tips.

## 2024-07-04 NOTE — Progress Notes (Signed)
 Because this visit was a virtual/telehealth visit,  certain criteria was not obtained, such a blood pressure, CBG if applicable, and timed get up and go. Any medications not marked as taking were not mentioned during the medication reconciliation part of the visit. Any vitals not documented were not able to be obtained due to this being a telehealth visit or patient was unable to self-report a recent blood pressure reading due to a lack of equipment at home via telehealth. Vitals that have been documented are verbally provided by the patient.  This visit was performed by a medical professional under my direct supervision. I was immediately available for consultation/collaboration. I have reviewed and agree with the Annual Wellness Visit documentation.  Subjective:   Hannah Landry is a 78 y.o. who presents for a Medicare Wellness preventive visit.  As a reminder, Annual Wellness Visits don't include a physical exam, and some assessments may be limited, especially if this visit is performed virtually. We may recommend an in-person follow-up visit with your provider if needed.  Visit Complete: Virtual I connected with  Hannah Landry on 07/04/24 by a audio enabled telemedicine application and verified that I am speaking with the correct person using two identifiers.  Patient Location: Home  Provider Location: Home Office  I discussed the limitations of evaluation and management by telemedicine. The patient expressed understanding and agreed to proceed.  Vital Signs: Because this visit was a virtual/telehealth visit, some criteria may be missing or patient reported. Any vitals not documented were not able to be obtained and vitals that have been documented are patient reported.  VideoDeclined- This patient declined Librarian, academic. Therefore the visit was completed with audio only.  Persons Participating in Visit: Patient.  AWV Questionnaire: No: Patient Medicare AWV  questionnaire was not completed prior to this visit.  Cardiac Risk Factors include: advanced age (>65men, >65 women);hypertension;dyslipidemia     Objective:    Today's Vitals   07/04/24 1110  BP: 134/61  Weight: 132 lb (59.9 kg)  Height: 5' 1 (1.549 m)  PainSc: 0-No pain   Body mass index is 24.94 kg/m.     07/04/2024   11:10 AM 06/19/2024   11:14 AM 04/10/2024    9:47 AM 02/22/2024   10:07 AM 11/30/2023   10:50 AM 09/07/2023   11:40 AM 07/18/2023    9:53 AM  Advanced Directives  Does Patient Have a Medical Advance Directive? No No Yes No Yes Yes No  Type of Best boy of [place];Living will  Living will Healthcare Power of Secretary;Living will Living will;Healthcare Power of Attorney  Does patient want to make changes to medical advance directive?       No - Patient declined  Copy of Healthcare Power of Attorney in Chart?   No - copy requested      Would patient like information on creating a medical advance directive? No - Patient declined No - Patient declined     No - Patient declined    Current Medications (verified) Outpatient Encounter Medications as of 07/04/2024  Medication Sig   acetaminophen (TYLENOL) 325 MG tablet Take 650 mg by mouth every 6 (six) hours as needed.   ALPHAGAN P 0.15 % ophthalmic solution    amLODipine  (NORVASC ) 5 MG tablet Take 1 tablet (5 mg total) by mouth daily.   Calcium  Carbonate-Vit D-Min (CALTRATE 600+D PLUS MINERALS) 600-800 MG-UNIT TABS Take by mouth.   ciclopirox  (PENLAC ) 8 % solution Apply topically at bedtime. Apply  over nail and surrounding skin. Apply daily over previous coat. After seven (7) days, may remove with alcohol and continue cycle.   Dorzolamide HCl-Timolol Mal PF 2-0.5 % SOLN Apply 1 drop to eye 2 (two) times daily.   fluticasone  (CUTIVATE ) 0.05 % cream Apply topically 2 (two) times daily. (Patient taking differently: Apply topically as needed.)   folic acid  (FOLVITE ) 400 MCG tablet Take 400 mcg by  mouth daily.     Garlic 1000 MG CAPS Take by mouth.   ketoconazole  (NIZORAL ) 2 % cream Apply 2 gr daily to feet and between toes for 2 months   latanoprost (XALATAN) 0.005 % ophthalmic solution SMARTSIG:In Eye(s)   metoprolol  tartrate (LOPRESSOR ) 25 MG tablet Take 1 tablet (25 mg total) by mouth 2 (two) times daily.   Multiple Vitamins-Minerals (CENTRUM SILVER ADULT 50+) TABS Take 1 tablet by mouth daily.   rosuvastatin  (CRESTOR ) 10 MG tablet Take 1 tablet (10 mg total) by mouth daily.   valACYclovir  (VALTREX ) 1000 MG tablet Take 2 tabs now and repeat in 12 hours   No facility-administered encounter medications on file as of 07/04/2024.    Allergies (verified) Patient has no known allergies.   History: Past Medical History:  Diagnosis Date   Anemia    CAD in native artery 01/09/2024   Heart murmur    Hx of adenomatous colonic polyps 09/18/2017   Primary hypertension 09/22/2021   Primary hypertension 09/22/2021   Thalassanemia    Past Surgical History:  Procedure Laterality Date   ABDOMINAL HYSTERECTOMY     CESAREAN SECTION     COLONOSCOPY     Family History  Problem Relation Age of Onset   Colon polyps Mother    Hypertension Mother    Hypertension Father    Colon cancer Neg Hx    Esophageal cancer Neg Hx    Rectal cancer Neg Hx    Stomach cancer Neg Hx    Social History   Socioeconomic History   Marital status: Widowed    Spouse name: Irie Dowson   Number of children: Not on file   Years of education: Not on file   Highest education level: Bachelor's degree (e.g., BA, AB, BS)  Occupational History   Not on file  Tobacco Use   Smoking status: Never   Smokeless tobacco: Never  Substance and Sexual Activity   Alcohol use: No   Drug use: No   Sexual activity: Not Currently  Other Topics Concern   Not on file  Social History Narrative   Widowed    Retired    Family in [place]    Social Drivers of Health   Financial Resource Strain: Low Risk  (07/04/2024)    Overall Financial Resource Strain (CARDIA)    Difficulty of Paying Living Expenses: Not hard at all  Food Insecurity: No Food Insecurity (07/04/2024)   Hunger Vital Sign    Worried About Running Out of Food in the Last Year: Never true    Ran Out of Food in the Last Year: Never true  Transportation Needs: No Transportation Needs (07/04/2024)   PRAPARE - Administrator, Civil Service (Medical): No    Lack of Transportation (Non-Medical): No  Physical Activity: Sufficiently Active (07/04/2024)   Exercise Vital Sign    Days of Exercise per Week: 5 days    Minutes of Exercise per Session: 30 min  Recent Concern: Physical Activity - Insufficiently Active (06/17/2024)   Exercise Vital Sign    Days of Exercise per  Week: 3 days    Minutes of Exercise per Session: 30 min  Stress: No Stress Concern Present (07/04/2024)   [employer] of Occupational Health - Occupational Stress Questionnaire    Feeling of Stress: Only a little  Social Connections: Moderately Integrated (07/04/2024)   Social Connection and Isolation Panel    Frequency of Communication with Friends and Family: More than three times a week    Frequency of Social Gatherings with Friends and Family: Once a week    Attends Religious Services: More than 4 times per year    Active Member of [employer] or Organizations: Yes    Attends Banker Meetings: More than 4 times per year    Marital Status: Widowed    Tobacco Counseling Counseling given: Not Answered    Clinical Intake:  Pre-visit preparation completed: Yes  Pain : No/denies pain Pain Score: 0-No pain     BMI - recorded: 24.94 Nutritional Status: BMI of 19-24  Normal Nutritional Risks: None Diabetes: No  No results found for: HGBA1C   How often do you need to have someone help you when you read instructions, pamphlets, or other written materials from your doctor or pharmacy?: 1 - Never  Interpreter Needed?: No  Information entered by ::  Rolly Magri,cma   Activities of Daily Living     07/04/2024   10:11 AM 07/01/2024    8:31 AM  In your present state of health, do you have any difficulty performing the following activities:  Hearing? 0 0  Vision? 0 0  Difficulty concentrating or making decisions? 0 0  Walking or climbing stairs? 0 0  Dressing or bathing? 0 0  Doing errands, shopping? 0 0  Preparing Food and eating ? N N  Using the Toilet? N N  In the past six months, have you accidently leaked urine? N N  Do you have problems with loss of bowel control? N N  Managing your Medications? N N  Managing your Finances? N N  Housekeeping or managing your Housekeeping? N N    Patient Care Team: Adele Song, MD as PCP - General (Family Medicine) Alvan Ronal BRAVO, MD (Inactive) as PCP - Cardiology (Cardiology) Skeet Juliene SAUNDERS, DO as Consulting Physician (Neurology) Mammography, [facility] (Diagnostic Radiology) Sherrod Sherrod, MD as Consulting Physician (Oncology) Alm Delon SAILOR, DO as Consulting Physician (Dermatology) [facility], [facility]  I have updated your Care Teams any recent Medical Services you may have received from other providers in the past year.     Assessment:   This is a routine wellness examination for Kilani.  Hearing/Vision screen Hearing Screening - Comments:: No difficulties  Vision Screening - Comments:: Patient wears glasses    Goals Addressed             This Visit's Progress    Remain active and independent   On track      Depression Screen     07/04/2024   11:13 AM 07/04/2024   11:12 AM 06/19/2024   11:14 AM 02/22/2024   10:06 AM 07/18/2023    9:53 AM 06/30/2023   10:14 AM 03/02/2023   11:29 AM  PHQ 2/9 Scores  PHQ - 2 Score 0 0 0 0 0 0 0  PHQ- 9 Score 0 1 1 3 2  0 0    Fall Risk     07/04/2024   10:11 AM 07/01/2024    8:31 AM 06/19/2024   11:14 AM 04/10/2024    9:46 AM 09/07/2023  11:40 AM  Fall Risk   Falls in the past year? 0 0 0 0 0  Number falls in  past yr: 0  0 0 0  Injury with Fall? 0  0 0 0  Risk for fall due to : No Fall Risks      Follow up Falls evaluation completed   Falls evaluation completed Falls evaluation completed    MEDICARE RISK AT HOME:  Medicare Risk at Home Any stairs in or around the home?: (Patient-Rptd) Yes If so, are there any without handrails?: (Patient-Rptd) No Home free of loose throw rugs in walkways, pet beds, electrical cords, etc?: (Patient-Rptd) Yes Adequate lighting in your home to reduce risk of falls?: (Patient-Rptd) Yes Life alert?: (Patient-Rptd) No Use of a cane, walker or w/c?: (Patient-Rptd) No Grab bars in the bathroom?: (Patient-Rptd) No Shower chair or bench in shower?: (Patient-Rptd) Yes Elevated toilet seat or a handicapped toilet?: (Patient-Rptd) No  TIMED UP AND GO:  Was the test performed?  No  Cognitive Function: 6CIT completed        07/04/2024   11:13 AM 06/30/2023   10:15 AM  6CIT Screen  What Year? 0 points 0 points  What month? 0 points 0 points  What time? 0 points 0 points  Count back from 20 0 points 0 points  Months in reverse 0 points 0 points  Repeat phrase 0 points 0 points  Total Score 0 points 0 points    Immunizations Immunization History  Administered Date(s) Administered   DTaP 01/22/2004   Fluad Quad(high Dose 65+) 10/02/2019, 09/21/2021, 08/09/2022   Fluad Trivalent(High Dose 65+) 09/05/2023   Influenza Split 11/21/2012   Influenza,inj,Quad PF,6+ Mos 08/21/2013, 08/27/2014, 09/09/2015, 09/14/2016, 09/20/2017, 09/25/2018, 09/15/2020   Influenza-Unspecified 09/14/2020, 09/21/2021   PFIZER(Purple Top)SARS-COV-2 Vaccination 08/13/2020, 03/12/2021   Pfizer(Comirnaty)Fall Seasonal Vaccine 12 years and older 09/15/2023   Pneumococcal Conjugate-13 09/09/2015   Pneumococcal Polysaccharide-23 05/09/2011   Pneumococcal-Unspecified 09/09/2015   Tdap 03/18/2014   Unspecified SARS-COV-2 Vaccination 12/26/2019, 01/16/2020, 08/13/2020, 03/12/2021, 09/09/2021,  08/12/2022   Zoster Recombinant(Shingrix) 11/27/2018, 05/15/2019   Zoster, Live 11/27/2008, 05/15/2019    Screening Tests Health Maintenance  Topic Date Due   COVID-19 Vaccine (10 - 2024-25 season) 03/14/2024   DTaP/Tdap/Td (3 - Td or Tdap) 03/18/2024   INFLUENZA VACCINE  06/14/2024   Medicare Annual Wellness (AWV)  07/04/2025   Pneumococcal Vaccine: 50+ Years  Completed   DEXA SCAN  Completed   Hepatitis C Screening  Completed   Zoster Vaccines- Shingrix  Completed   HPV VACCINES  Aged Out   Meningococcal B Vaccine  Aged Out   Colonoscopy  Discontinued    Health Maintenance  Health Maintenance Due  Topic Date Due   COVID-19 Vaccine (10 - 2024-25 season) 03/14/2024   DTaP/Tdap/Td (3 - Td or Tdap) 03/18/2024   INFLUENZA VACCINE  06/14/2024   Health Maintenance Items Addressed:patient declined vaccinations   Additional Screening:  Vision Screening: Recommended annual ophthalmology exams for early detection of glaucoma and other disorders of the eye. Would you like a referral to an eye doctor? No    Dental Screening: Recommended annual dental exams for proper oral hygiene  Community Resource Referral / Chronic Care Management: CRR required this visit?  No   CCM required this visit?  No   Plan:    I have personally reviewed and noted the following in the patient's chart:   Medical and social history Use of alcohol, tobacco or illicit drugs  Current medications and supplements including  opioid prescriptions. Patient is not currently taking opioid prescriptions. Functional ability and status Nutritional status Physical activity Advanced directives List of other physicians Hospitalizations, surgeries, and ER visits in previous 12 months Vitals Screenings to include cognitive, depression, and falls Referrals and appointments  In addition, I have reviewed and discussed with patient certain preventive protocols, quality metrics, and best practice recommendations. A  written personalized care plan for preventive services as well as general preventive health recommendations were provided to patient.   Lyle MARLA Right, [place]   07/04/2024   After Visit Summary: (MyChart) Due to this being a telephonic visit, the after visit summary with patients personalized plan was offered to patient via MyChart   Notes: Nothing significant to report at this time.

## 2024-07-09 ENCOUNTER — Other Ambulatory Visit: Payer: Self-pay

## 2024-07-09 ENCOUNTER — Encounter (HOSPITAL_BASED_OUTPATIENT_CLINIC_OR_DEPARTMENT_OTHER): Payer: Self-pay | Admitting: Cardiovascular Disease

## 2024-07-09 MED ORDER — METOPROLOL TARTRATE 25 MG PO TABS: 25.0000 mg | ORAL_TABLET | Freq: Two times a day (BID) | ORAL | 1 refills | Status: AC

## 2024-07-09 NOTE — Telephone Encounter (Signed)
 RX sent in

## 2024-07-10 LAB — HM MAMMOGRAPHY

## 2024-07-16 ENCOUNTER — Encounter: Payer: Self-pay | Admitting: Family Medicine

## 2024-07-25 ENCOUNTER — Inpatient Hospital Stay: Attending: Internal Medicine

## 2024-07-25 ENCOUNTER — Inpatient Hospital Stay (HOSPITAL_BASED_OUTPATIENT_CLINIC_OR_DEPARTMENT_OTHER): Admitting: Internal Medicine

## 2024-07-25 VITALS — BP 126/68 | HR 56 | Temp 97.3°F | Resp 17 | Wt 134.8 lb

## 2024-07-25 DIAGNOSIS — Z860101 Personal history of adenomatous and serrated colon polyps: Secondary | ICD-10-CM | POA: Diagnosis not present

## 2024-07-25 DIAGNOSIS — D563 Thalassemia minor: Secondary | ICD-10-CM | POA: Insufficient documentation

## 2024-07-25 LAB — CBC WITH DIFFERENTIAL (CANCER CENTER ONLY)
Abs Immature Granulocytes: 0.02 K/uL (ref 0.00–0.07)
Basophils Absolute: 0 K/uL (ref 0.0–0.1)
Basophils Relative: 1 %
Eosinophils Absolute: 0.1 K/uL (ref 0.0–0.5)
Eosinophils Relative: 1 %
HCT: 29.1 % — ABNORMAL LOW (ref 36.0–46.0)
Hemoglobin: 9 g/dL — ABNORMAL LOW (ref 12.0–15.0)
Immature Granulocytes: 0 %
Lymphocytes Relative: 17 %
Lymphs Abs: 1.2 K/uL (ref 0.7–4.0)
MCH: 17 pg — ABNORMAL LOW (ref 26.0–34.0)
MCHC: 30.9 g/dL (ref 30.0–36.0)
MCV: 55.1 fL — ABNORMAL LOW (ref 80.0–100.0)
Monocytes Absolute: 0.4 K/uL (ref 0.1–1.0)
Monocytes Relative: 6 %
Neutro Abs: 5.2 K/uL (ref 1.7–7.7)
Neutrophils Relative %: 75 %
Platelet Count: 171 K/uL (ref 150–400)
RBC: 5.28 MIL/uL — ABNORMAL HIGH (ref 3.87–5.11)
RDW: 18.8 % — ABNORMAL HIGH (ref 11.5–15.5)
WBC Count: 7 K/uL (ref 4.0–10.5)
nRBC: 0.3 % — ABNORMAL HIGH (ref 0.0–0.2)

## 2024-07-25 LAB — IRON AND IRON BINDING CAPACITY (CC-WL,HP ONLY)
Iron: 79 ug/dL (ref 28–170)
Saturation Ratios: 24 % (ref 10.4–31.8)
TIBC: 325 ug/dL (ref 250–450)
UIBC: 246 ug/dL (ref 148–442)

## 2024-07-25 LAB — FERRITIN: Ferritin: 720 ng/mL — ABNORMAL HIGH (ref 11–307)

## 2024-07-25 LAB — FOLATE: Folate: 13.1 ng/mL (ref >5.9)

## 2024-07-25 LAB — VITAMIN B12: Vitamin B-12: 563 pg/mL (ref 180–914)

## 2024-07-25 NOTE — Progress Notes (Signed)
 [facility] [phone]   [phone]  OFFICE PROGRESS NOTE  Adele Song, MD [address] [place] [address]  DIAGNOSIS: Thalassemia minor  PRIOR THERAPY: None  CURRENT THERAPY: Observation  INTERVAL HISTORY: Hannah Landry 78 y.o. female returns to the clinic today for 40-month follow-up visit.Discussed the use of AI scribe software for clinical note transcription with the patient, who gave verbal consent to proceed.  History of Present Illness Hannah Landry is a 78 year old female with thalassemia minor who presents for evaluation and repeat blood work.  She experiences a 'trembling vibration' throughout her body almost constantly and is under the care of a neurologist for this issue, suspecting it may be related to neuropathy or nerve issues originating from her back.  She experiences fatigue and difficulty sleeping, typically going to bed around 10:30 PM but waking up between 3:00 and 4:00 AM, finding it challenging to return to sleep. She attributes some of this to her medications, which necessitate frequent nocturnal urination. She does not nap during the day and has previously been prescribed a sleep aid.  Her hemoglobin level is 9.0, and she has a history of thalassemia minor with previous hemoglobin values in the 9 to 10 range. She has not been taking iron supplements and has been lax with her vitamin and B12 intake.     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  MEDICATIONS:  Current Outpatient Medications  Medication Sig Dispense Refill   acetaminophen (TYLENOL) 325 MG tablet Take 650 mg by mouth every 6 (six) hours as needed.     ALPHAGAN P 0.15 % ophthalmic solution       amLODipine  (NORVASC ) 5 MG tablet Take 1 tablet (5 mg total) by mouth daily. 180 tablet 3   Calcium  Carbonate-Vit D-Min (CALTRATE 600+D PLUS MINERALS) 600-800 MG-UNIT TABS Take by mouth.     ciclopirox  (PENLAC ) 8 % solution Apply topically at bedtime. Apply over nail and surrounding skin. Apply daily over previous coat. After seven (7) days, may remove with alcohol and continue cycle. 6.6 mL 5   Dorzolamide HCl-Timolol Mal PF 2-0.5 % SOLN Apply 1 drop to eye 2 (two) times daily.     fluticasone  (CUTIVATE ) 0.05 % cream Apply topically 2 (two) times daily. (Patient taking differently: Apply topically as needed.) 30 g 2   folic acid  (FOLVITE ) 400 MCG tablet Take 400 mcg by mouth daily.       Garlic 1000 MG CAPS Take by mouth.     ketoconazole  (NIZORAL ) 2 % cream Apply 2 gr daily to feet and between toes for 2 months 60 g 3   latanoprost (XALATAN) 0.005 % ophthalmic solution SMARTSIG:In Eye(s)     metoprolol  tartrate (LOPRESSOR ) 25 MG tablet Take 1 tablet (25 mg total) by mouth 2 (two) times daily. 180 tablet 1   Multiple Vitamins-Minerals (CENTRUM SILVER ADULT 50+) TABS Take 1 tablet by mouth daily.     rosuvastatin  (CRESTOR ) 10 MG tablet Take 1 tablet (10 mg total) by mouth daily. 90 tablet 3   valACYclovir  (VALTREX ) 1000 MG tablet Take 2 tabs now and repeat in 12 hours 12 tablet 2   No current facility-administered medications for  this visit.    SURGICAL HISTORY:  Past Surgical History:  Procedure Laterality Date   ABDOMINAL HYSTERECTOMY     CESAREAN SECTION     COLONOSCOPY      REVIEW OF SYSTEMS:  A comprehensive review of systems was negative except for: Constitutional: positive for fatigue Neurological: positive for paresthesia Behavioral/Psych: positive for sleep disturbance   PHYSICAL EXAMINATION: General appearance: alert, cooperative, fatigued, and no distress Head: Normocephalic, without obvious abnormality, atraumatic Neck: no adenopathy, no JVD, supple, symmetrical, trachea  midline, and thyroid  not enlarged, symmetric, no tenderness/mass/nodules Lymph nodes: Cervical, supraclavicular, and axillary nodes normal. Resp: clear to auscultation bilaterally Back: symmetric, no curvature. ROM normal. No CVA tenderness. Cardio: regular rate and rhythm, S1, S2 normal, no murmur, click, rub or gallop GI: soft, non-tender; bowel sounds normal; no masses,  no organomegaly Extremities: extremities normal, atraumatic, no cyanosis or edema  ECOG PERFORMANCE STATUS: 1 - Symptomatic but completely ambulatory  Blood pressure 126/68, pulse (!) 56, temperature (!) 97.3 F (36.3 C), resp. rate 17, weight 134 lb 12.8 oz (61.1 kg).  LABORATORY DATA: Lab Results  Component Value Date   WBC 7.0 07/25/2024   HGB 9.0 (L) 07/25/2024   HCT 29.1 (L) 07/25/2024   MCV 55.1 (L) 07/25/2024   PLT 171 07/25/2024      Chemistry      Component Value Date/Time   NA 144 04/19/2024 0845   K 4.9 04/19/2024 0845   CL 106 04/19/2024 0845   CO2 22 04/19/2024 0845   BUN 14 04/19/2024 0845   CREATININE 1.04 (H) 04/19/2024 0845   CREATININE 0.85 07/29/2021 0948   CREATININE 0.81 12/02/2013 1034      Component Value Date/Time   CALCIUM  9.7 04/19/2024 0845   ALKPHOS 96 04/19/2024 0845   AST 18 04/19/2024 0845   AST 14 (L) 07/29/2021 0948   ALT 14 04/19/2024 0845   ALT 12 07/29/2021 0948   BILITOT 1.6 (H) 04/19/2024 0845   BILITOT 1.8 (H) 07/29/2021 0948       RADIOGRAPHIC STUDIES: No results found.  ASSESSMENT AND PLAN: This is a very pleasant 78 years old African-American female with history of thalassemia minor as well as iron overload.  She has a history of adenomatous polyp in the past  She is currently on observation with vitamin B supplement.   Assessment and Plan Assessment & Plan Beta thalassemia minor with chronic anemia Chronic anemia with hemoglobin levels consistently in the range of 9 to 10 g/dL due to beta thalassemia minor. No expectation of hemoglobin levels  reaching 12 or 13 g/dL. No current iron supplementation. No significant changes in anemia management as thalassemia is the underlying cause. - Schedule follow-up in six months unless significant iron or B12 deficiency is detected.  Possible vitamin B12 and folic acid  deficiency due to nonadherence Nonadherence to vitamin B12 and folic acid  supplementation. Potential deficiency due to inconsistent intake. - Encourage adherence to vitamin B12 and folic acid  supplementation a few days a week. She was advised to call immediately if she has any other concerning symptoms in the interval. The patient voices understanding of current disease status and treatment options and is in agreement with the current care plan.  All questions were answered. The patient knows to call the clinic with any problems, questions or concerns. We can certainly see the patient much sooner if necessary.   Disclaimer: This note was dictated with voice recognition software. Similar sounding words can inadvertently be transcribed and may not be corrected  upon review.

## 2024-07-29 ENCOUNTER — Encounter: Payer: Self-pay | Admitting: *Deleted

## 2024-08-01 ENCOUNTER — Encounter (HOSPITAL_BASED_OUTPATIENT_CLINIC_OR_DEPARTMENT_OTHER): Payer: Self-pay | Admitting: Cardiovascular Disease

## 2024-08-01 ENCOUNTER — Ambulatory Visit (HOSPITAL_BASED_OUTPATIENT_CLINIC_OR_DEPARTMENT_OTHER): Admitting: Cardiovascular Disease

## 2024-08-01 VITALS — BP 128/60 | HR 63 | Ht 61.0 in | Wt 135.0 lb

## 2024-08-01 DIAGNOSIS — I10 Essential (primary) hypertension: Secondary | ICD-10-CM

## 2024-08-01 DIAGNOSIS — I2510 Atherosclerotic heart disease of native coronary artery without angina pectoris: Secondary | ICD-10-CM | POA: Diagnosis not present

## 2024-08-01 NOTE — Patient Instructions (Signed)
 Medication Instructions:  ?Your physician recommends that you continue on your current medications as directed. Please refer to the Current Medication list given to you today.  ? ?Labwork: ?NONE ? ?Testing/Procedures: ?NONE ? ?Follow-Up: ?AS NEEDED  ? ?  ?

## 2024-08-01 NOTE — Progress Notes (Signed)
 Cardiology Office Note:  .   Date:  08/01/2024  ID:  Hannah Landry, DOB May 18, 1946, MRN [id number] PCP: Adele Song, MD  [facility] Providers Cardiologist:  Alvan Ronal BRAVO, MD (Inactive)    History of Present Illness: .    Hannah Landry is a 78 y.o. female with non-obstructive CAD, hypertension, hyperlipidemia and thalassemia minor here for follow up.  She saw Dr. Alvan 04/2023 for and noted chest pain.  She was seen in the ED with an episode of chest pain and elevated blood pressure.  Her husband passed 6 months prior.  She described chest heaviness and was also struggling with elevated blood pressures.  Cardiac enzymes were negative.  Chest CTA was negative for pulmonary embolism.  On my review of her imaging there was calcification at the ostial LAD.  Echo 06/2023 revealed LVEF 55-60% with mild mitral digitation.  Diastolic function was indeterminate.  She noted some lower extremity edema.  Right atrial pressure was 3 mmHg and her edema was not thought to be cardiac in nature.  Cardiac enzymes were negative and her chest pain was thought to be non-ischemic.   Hannah Landry experiences fluctuations in her blood pressure, with diastolic pressure occasionally dropping to the high fifties without causing lightheadedness or dizziness. On January 11th, she had a spike to 200/99, accompanied by a headache and chest heaviness. These spikes occur about three times a month, sometimes with minimal exertion.  At her visit 06/2023 she also reported fluid retention intermittently.  She attributed this to her medication.  She also struggled with insomnia.  She was started on rosuvastatin .  Discussed the use of AI scribe software for clinical note transcription with the patient, who gave verbal consent to proceed.  History of Present Illness Hannah Landry notes that her blood pressure is slightly elevated in the morning, measuring around 130-140 mmHg systolic, but decreases to below 130 mmHg after taking her  medication. She monitors her blood pressure regularly and notes that it remains normal throughout the day. She is currently on amlodipine  and metoprolol .  She engages in physical activity by walking three to four times a week. She experiences mild fatigue when walking uphill but has no chest pain, pressure, palpitations, or heart racing during these activities. She reports minimal swelling around her ankles, which resolves on its own and is less severe than in the past.  She is on rosuvastatin , and her cholesterol levels have improved, with her LDL cholesterol at 54 mg/dL as of June. She recalls a previous CT scan showing mild calcification or plaque.  She experiences sleep disturbances, waking up at 3:30 or 4:00 AM and having difficulty returning to sleep. She previously used trazodone  50 mg for sleep, which she has not taken in over a year. She is considering resuming trazodone  or trying melatonin 5 mg to improve her sleep quality.  She describes a persistent sensation of trembling or vibration in her body, which she feels most of the time, although it is not visibly apparent. She has communicated this symptom to her other healthcare providers.   ROS:  As per HPI  Studies Reviewed: SABRA   EKG Interpretation Date/Time:  Thursday August 01 2024 08:07:34 EDT Ventricular Rate:  63 PR Interval:  156 QRS Duration:  70 QT Interval:  394 QTC Calculation: 403 R Axis:   9  Text Interpretation: Normal sinus rhythm Normal ECG No significant change since last tracing Confirmed by Raford Riggs ([id number]) on 08/01/2024 8:15:04 AM   Echo 07/04/23:  1. Left ventricular ejection fraction, by estimation, is 55 to 60%. The  left ventricle has normal function. The left ventricle has no regional  wall motion abnormalities. Left ventricular diastolic parameters are  indeterminate. The average left  ventricular global longitudinal strain is -23.2 %. The global longitudinal  strain is normal.   2. Right  ventricular systolic function is normal. The right ventricular  size is normal. There is normal pulmonary artery systolic pressure.   3. The mitral valve is normal in structure. Mild mitral valve  regurgitation. No evidence of mitral stenosis.   4. The aortic valve is normal in structure. Aortic valve regurgitation is  trivial. No aortic stenosis is present.   5. The inferior vena cava is normal in size with greater than 50%  respiratory variability, suggesting right atrial pressure of 3 mmHg.    Risk Assessment/Calculations:             Physical Exam:   VS:  BP 128/60 (BP Location: Left Arm, Patient Position: Sitting)   Pulse 63   Ht 5' 1 (1.549 m)   Wt 135 lb (61.2 kg)   SpO2 100%   BMI 25.51 kg/m  , BMI Body mass index is 25.51 kg/m. GENERAL:  Well appearing HEENT: Pupils equal round and reactive, fundi not visualized, oral mucosa unremarkable NECK:  No jugular venous distention, waveform within normal limits, carotid upstroke brisk and symmetric, no bruits, no thyromegaly LUNGS:  Clear to auscultation bilaterally HEART:  RRR.  PMI not displaced or sustained,S1 and S2 within normal limits, no S3, no S4, no clicks, no rubs, no murmurs ABD:  Flat, positive bowel sounds normal in frequency in pitch, no bruits, no rebound, no guarding, no midline pulsatile mass, no hepatomegaly, no splenomegaly EXT:  2 plus pulses throughout, no edema, no cyanosis no clubbing SKIN:  No rashes no nodules NEURO:  Cranial nerves II through XII grossly intact, motor grossly intact throughout PSYCH:  Cognitively intact, oriented to person place and time   ASSESSMENT AND PLAN: .    Assessment & Plan # Atherosclerotic heart disease of native coronary artery Mild calcification on CT. LDL well-controlled with rosuvastatin . Low cardiovascular risk with current management. - Continue rosuvastatin  for cholesterol management.  # Hyperlipidemia LDL at 54 mg/dL with rosuvastatin . Goal to maintain low LDL  to prevent atherosclerosis progression. - Continue rosuvastatin . - LDL goal <70  # Primary hypertension Morning BP slightly elevated, normalizes post-medication. Current regimen effective. Further adjustment may cause hypotension. Emphasized exercise and salt restriction. - Continue amlodipine  and metoprolol . - Encourage exercise and salt restriction.  # Lower extremity edema, mild, intermittent Mild intermittent ankle swelling, resolves spontaneously. No significant worsening. - Monitor for increase in swelling or symptoms.  3 Insomnia Difficulty staying asleep. Previously effective trazodone  50 mg. Considering melatonin 5 mg. Both safe with current medications. - Consider melatonin 5 mg. - Consider trazodone  50 mg if melatonin ineffective.        Dispo: f/u as needed  Signed, Annabella Scarce, MD

## 2024-09-05 ENCOUNTER — Ambulatory Visit

## 2024-09-05 DIAGNOSIS — Z23 Encounter for immunization: Secondary | ICD-10-CM | POA: Diagnosis not present

## 2024-09-06 NOTE — Progress Notes (Signed)
 Patient presents to nurse clinic for flu vaccination.   Administered without complication.   Chiquita JAYSON English, RN

## 2024-10-01 ENCOUNTER — Ambulatory Visit

## 2024-10-03 ENCOUNTER — Telehealth: Payer: Self-pay | Admitting: Family Medicine

## 2024-10-03 DIAGNOSIS — B009 Herpesviral infection, unspecified: Secondary | ICD-10-CM

## 2024-10-03 DIAGNOSIS — R234 Changes in skin texture: Secondary | ICD-10-CM

## 2024-10-04 NOTE — Telephone Encounter (Signed)
 Called patient.   This prescription is on her medication list. Last refill was 2023.  She reports an outbreak every so often on mouth/nose.  This is not a new problem.   Will forward to PCP to refill.

## 2024-10-31 ENCOUNTER — Ambulatory Visit: Admitting: Neurology

## 2024-10-31 ENCOUNTER — Encounter

## 2024-11-22 NOTE — Progress Notes (Unsigned)
 "  NEUROLOGY FOLLOW UP OFFICE NOTE  Nyhla Landry [id number]  Assessment/Plan:   Lumbar spinal stenosis Lower extremity discomfort not consistent with restless leg.  May be related to the lumbar stenosis but notes symptoms have overall resolved and neurologic exam is intact.    Follow up as needed.  Total time spent in chart and face to face with patient:  10 minutes   Subjective:  Hannah Landry is a 79 year old female with thalassanemia minor and anemia who follows up for headaches and neuropathy.  MRI lumbar spine personally reviewed.  UPDATE: Tingling in legs have improved overall.  Mostly sometimes legs feel tired from the around the knees down.  No pain.     HISTORY: Paresthesias and bilateral lower extremity weakness: She also has chronic weakness attributed to her anemia but felt worsening weakness in the legs.  She has had tingling in her fingers and toes since about 12/13/21 but started noticing involvement of her legs as well.  Endorses an intermittent pins and needles and tightness sensation in the legs below the knees but not really in the feet.  Does not have the feeling when she is lying down.  Legs feel tired but no actual pain.  She feels off balance on her feet.  No back pain.  She takes oral B12 supplement.  Labs for causes of neuropathy unremarkable, including negative ANA, negative ENA panel (with SSA/SSB antibodies), negative IFE, ACE 29, B12 1156, TSH 0.69, normal copper , normal heavy metal screen, normal CK.  She has been experiencing fluid retention in the ankles.  Vascular ABI in August 2024 was negative.  No back pain.  Due to ongoing paresthesias and lower extremity weakness, underwent NCV-EMG of lower extremities on 09/28/2023 which was normal.  MRI of lumbar spine on 10/23/2023 revealed multilevel degenerative changes with mild to moderate disc bulges and moderate bilateral facet arthropathy at L2-3, L3-4 and L4-5 as well as moderate central canal stenosis at L3-4 and  L4-5.  Referred to physical therapy.  It was helpful and still does home exercises.  Weakness overall improved.  Tingling in both legs improved.  No longer feels tingling but sometimes feels vibration sensation in her body, more prominent in the legs.  It is not a creepy crawly feeling that is uncomfortable and causes her to move her legs for relief.  Labs from 01/24/2024 revealed B12 485 and labs from 02/22/2024 revealed CK 66 and ferritin 816.   Headache: Patient has had headaches off and since 12-13-21.  Her husband passed away on 03-22-2023.  She has been experiencing grief and poor sleep.  She started having worsening headaches.  She presented to the ED on 5/25.for a particularly severe headache but also for new left arm heaviness and chest pressure and back pain.  EKG and labs were negative for acute coronary event.  CTA chest negative for PE.  CT head negative for acute intracranial abnormalities.  MRI of brain without contrast was also unremarkable, revealing age-related mild generalized cerebral atrophy and few punctate T2/FLAIR hyperintense foci in the cerebral white matter.  She was given ASA for chest pressure and Reglan  and Benadryl  for headache.  She continues to have daily headaches but no longer severe.  They occur after getting up in the morning. She takes Tylenol daily, which helps.  It is a throbbing pain at her crown or top of the head.  No associated nausea, vomiting, photophobia, phonophobia, visual disturbance.  She was started on metoprolol  for high blood pressure.  Once her blood pressure was controlled, headaches improved.    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     ALPHAGAN P 0.15 % ophthalmic solution      amLODipine  (NORVASC ) 5 MG tablet Take 1 tablet (5 mg total) by mouth daily. 180 tablet 3   Calcium  Carbonate-Vit D-Min (CALTRATE 600+D PLUS MINERALS) 600-800 MG-UNIT TABS Take by mouth.     ciclopirox  (PENLAC ) 8 % solution Apply topically at bedtime. Apply over nail and surrounding skin. Apply daily over previous coat. After seven (7) days, may remove with alcohol and continue cycle. 6.6 mL 5   Dorzolamide HCl-Timolol Mal PF 2-0.5 % SOLN Apply 1 drop to eye 2 (two) times daily.     fluticasone  (CUTIVATE ) 0.05 % cream Apply 2 Applications topically as needed. 30 g 0   folic acid  (FOLVITE ) 400 MCG tablet Take 400 mcg by mouth daily.       Garlic 1000 MG CAPS Take by mouth.     ketoconazole  (NIZORAL ) 2 % cream Apply 2 gr daily to feet and between toes for 2 months 60 g 3   latanoprost (XALATAN) 0.005 % ophthalmic solution SMARTSIG:In Eye(s)     metoprolol  tartrate (LOPRESSOR ) 25 MG tablet Take 1 tablet (25 mg total) by mouth 2 (two) times daily. 180 tablet 1   Multiple Vitamins-Minerals (CENTRUM SILVER ADULT 50+) TABS Take 1 tablet by mouth daily.     rosuvastatin  (CRESTOR ) 10 MG tablet Take 1 tablet (10 mg total) by mouth daily. 90 tablet 3   valACYclovir  (VALTREX ) 1000 MG tablet TAKE 2 TABLETS BY MOUTH NOW AND  REPEAT  IN  12  HOURS 12 tablet 0   No current facility-administered medications on file prior to visit.    ALLERGIES: No Known Allergies  FAMILY HISTORY: Family History  Problem Relation Age of Onset   Colon polyps Mother    Hypertension Mother    Hypertension Father    Colon cancer Neg Hx    Esophageal cancer Neg Hx    Rectal cancer Neg Hx    Stomach cancer Neg Hx       Objective:  Blood pressure 136/65, pulse 70, height 5' 1 (1.549 m), weight 134 lb 9.6 oz (61.1 kg), SpO2 97%. General: No acute  distress.  Patient appears well-groomed.   Head:  Normocephalic/atraumatic Neck:  Supple.  No paraspinal tenderness.  Full range of motion. Heart:  Regular rate and rhythm. Neuro:  Alert and oriented.  Speech fluent and not dysarthric.  Language intact.  CN II-XII intact.  Bulk and tone normal.  Muscle strength 5/5 throughout.  Sensation to pinprick and vibration intact.  Deep tendon reflexes 2+ throughout, toes downgoing.  Gait normal.  Romberg negative.    Juliene Dunnings, DO  CC: Rea Raring, MD        "

## 2024-11-25 ENCOUNTER — Ambulatory Visit: Admitting: Neurology

## 2024-11-25 ENCOUNTER — Encounter: Payer: Self-pay | Admitting: Neurology

## 2024-11-25 VITALS — BP 136/65 | HR 70 | Ht 61.0 in | Wt 134.6 lb

## 2024-11-25 DIAGNOSIS — M48062 Spinal stenosis, lumbar region with neurogenic claudication: Secondary | ICD-10-CM

## 2025-01-21 ENCOUNTER — Ambulatory Visit: Admitting: Internal Medicine

## 2025-01-21 ENCOUNTER — Other Ambulatory Visit

## 2025-02-10 ENCOUNTER — Ambulatory Visit: Admitting: Dermatology

## 2025-07-10 ENCOUNTER — Encounter
